# Patient Record
Sex: Female | Born: 1945 | Race: White | Hispanic: No | Marital: Married | Smoking: Former smoker
Health system: Southern US, Community
[De-identification: ages and names within clinical notes are randomized; demographics above are authoritative.]

## PROBLEM LIST (undated history)

## (undated) DIAGNOSIS — K5792 Diverticulitis of intestine, part unspecified, without perforation or abscess without bleeding: Secondary | ICD-10-CM

## (undated) DIAGNOSIS — E785 Hyperlipidemia, unspecified: Secondary | ICD-10-CM

## (undated) DIAGNOSIS — M810 Age-related osteoporosis without current pathological fracture: Secondary | ICD-10-CM

## (undated) DIAGNOSIS — E063 Autoimmune thyroiditis: Secondary | ICD-10-CM

## (undated) DIAGNOSIS — Z9889 Other specified postprocedural states: Secondary | ICD-10-CM

## (undated) DIAGNOSIS — Z86010 Personal history of colonic polyps: Secondary | ICD-10-CM

## (undated) DIAGNOSIS — C801 Malignant (primary) neoplasm, unspecified: Secondary | ICD-10-CM

## (undated) DIAGNOSIS — J449 Chronic obstructive pulmonary disease, unspecified: Secondary | ICD-10-CM

## (undated) DIAGNOSIS — K5730 Diverticulosis of large intestine without perforation or abscess without bleeding: Secondary | ICD-10-CM

## (undated) DIAGNOSIS — I10 Essential (primary) hypertension: Secondary | ICD-10-CM

## (undated) DIAGNOSIS — K5909 Other constipation: Secondary | ICD-10-CM

## (undated) DIAGNOSIS — H04129 Dry eye syndrome of unspecified lacrimal gland: Secondary | ICD-10-CM

## (undated) DIAGNOSIS — M1990 Unspecified osteoarthritis, unspecified site: Secondary | ICD-10-CM

## (undated) DIAGNOSIS — Z860101 Personal history of adenomatous and serrated colon polyps: Secondary | ICD-10-CM

## (undated) DIAGNOSIS — E039 Hypothyroidism, unspecified: Secondary | ICD-10-CM

## (undated) DIAGNOSIS — Z87440 Personal history of urinary (tract) infections: Secondary | ICD-10-CM

## (undated) DIAGNOSIS — R112 Nausea with vomiting, unspecified: Secondary | ICD-10-CM

## (undated) DIAGNOSIS — C679 Malignant neoplasm of bladder, unspecified: Secondary | ICD-10-CM

## (undated) DIAGNOSIS — R351 Nocturia: Secondary | ICD-10-CM

## (undated) HISTORY — PX: CHOLECYSTECTOMY: SHX55

## (undated) HISTORY — PX: FOOT SURGERY: SHX648

---

## 1998-05-01 HISTORY — PX: LAPAROSCOPIC CHOLECYSTECTOMY: SUR755

## 2001-10-15 ENCOUNTER — Other Ambulatory Visit: Admission: RE | Admit: 2001-10-15 | Discharge: 2001-10-15 | Payer: Self-pay | Admitting: General Surgery

## 2005-08-21 ENCOUNTER — Emergency Department (HOSPITAL_COMMUNITY): Admission: EM | Admit: 2005-08-21 | Discharge: 2005-08-21 | Payer: Self-pay | Admitting: Emergency Medicine

## 2005-08-30 ENCOUNTER — Ambulatory Visit: Payer: Self-pay | Admitting: Orthopedic Surgery

## 2005-09-06 ENCOUNTER — Ambulatory Visit (HOSPITAL_COMMUNITY): Admission: RE | Admit: 2005-09-06 | Discharge: 2005-09-06 | Payer: Self-pay | Admitting: Orthopedic Surgery

## 2005-09-13 ENCOUNTER — Ambulatory Visit: Payer: Self-pay | Admitting: Orthopedic Surgery

## 2005-09-28 ENCOUNTER — Ambulatory Visit (HOSPITAL_COMMUNITY): Admission: RE | Admit: 2005-09-28 | Discharge: 2005-09-28 | Payer: Self-pay | Admitting: Neurosurgery

## 2005-09-29 ENCOUNTER — Encounter (HOSPITAL_COMMUNITY): Admission: RE | Admit: 2005-09-29 | Discharge: 2005-10-29 | Payer: Self-pay | Admitting: Neurosurgery

## 2006-03-06 ENCOUNTER — Ambulatory Visit: Payer: Self-pay | Admitting: Orthopedic Surgery

## 2006-03-08 ENCOUNTER — Ambulatory Visit (HOSPITAL_COMMUNITY): Admission: RE | Admit: 2006-03-08 | Discharge: 2006-03-08 | Payer: Self-pay | Admitting: Neurosurgery

## 2006-03-08 ENCOUNTER — Encounter: Payer: Self-pay | Admitting: Orthopedic Surgery

## 2006-03-09 ENCOUNTER — Encounter (HOSPITAL_COMMUNITY): Admission: RE | Admit: 2006-03-09 | Discharge: 2006-04-08 | Payer: Self-pay | Admitting: Orthopedic Surgery

## 2006-04-09 ENCOUNTER — Encounter (HOSPITAL_COMMUNITY): Admission: RE | Admit: 2006-04-09 | Discharge: 2006-04-30 | Payer: Self-pay | Admitting: Orthopedic Surgery

## 2006-04-17 ENCOUNTER — Ambulatory Visit: Payer: Self-pay | Admitting: Orthopedic Surgery

## 2006-08-22 ENCOUNTER — Ambulatory Visit (HOSPITAL_COMMUNITY): Admission: RE | Admit: 2006-08-22 | Discharge: 2006-08-22 | Payer: Self-pay | Admitting: Family Medicine

## 2006-08-23 ENCOUNTER — Ambulatory Visit (HOSPITAL_COMMUNITY): Admission: RE | Admit: 2006-08-23 | Discharge: 2006-08-23 | Payer: Self-pay | Admitting: Family Medicine

## 2007-07-22 ENCOUNTER — Ambulatory Visit: Payer: Self-pay | Admitting: Orthopedic Surgery

## 2007-07-22 DIAGNOSIS — M542 Cervicalgia: Secondary | ICD-10-CM | POA: Insufficient documentation

## 2007-07-22 DIAGNOSIS — M4800 Spinal stenosis, site unspecified: Secondary | ICD-10-CM | POA: Insufficient documentation

## 2007-07-22 DIAGNOSIS — M47812 Spondylosis without myelopathy or radiculopathy, cervical region: Secondary | ICD-10-CM | POA: Insufficient documentation

## 2007-07-22 DIAGNOSIS — D1800 Hemangioma unspecified site: Secondary | ICD-10-CM | POA: Insufficient documentation

## 2007-07-23 ENCOUNTER — Telehealth: Payer: Self-pay | Admitting: Orthopedic Surgery

## 2007-07-25 ENCOUNTER — Encounter: Payer: Self-pay | Admitting: Orthopedic Surgery

## 2007-07-26 ENCOUNTER — Ambulatory Visit (HOSPITAL_COMMUNITY): Admission: RE | Admit: 2007-07-26 | Discharge: 2007-07-26 | Payer: Self-pay | Admitting: Orthopedic Surgery

## 2007-09-19 ENCOUNTER — Encounter: Payer: Self-pay | Admitting: Orthopedic Surgery

## 2009-01-18 ENCOUNTER — Ambulatory Visit: Payer: Self-pay | Admitting: Orthopedic Surgery

## 2009-01-18 DIAGNOSIS — M19049 Primary osteoarthritis, unspecified hand: Secondary | ICD-10-CM | POA: Insufficient documentation

## 2009-03-08 ENCOUNTER — Ambulatory Visit: Payer: Self-pay | Admitting: Orthopedic Surgery

## 2009-04-07 ENCOUNTER — Telehealth: Payer: Self-pay | Admitting: Orthopedic Surgery

## 2009-04-07 ENCOUNTER — Ambulatory Visit: Payer: Self-pay | Admitting: Orthopedic Surgery

## 2009-11-08 ENCOUNTER — Ambulatory Visit (HOSPITAL_COMMUNITY): Admission: RE | Admit: 2009-11-08 | Discharge: 2009-11-08 | Payer: Self-pay | Admitting: Family Medicine

## 2014-08-18 ENCOUNTER — Ambulatory Visit (HOSPITAL_COMMUNITY)
Admission: RE | Admit: 2014-08-18 | Discharge: 2014-08-18 | Disposition: A | Discharge status: Home / Self Care | Payer: BLUE CROSS/BLUE SHIELD | Source: Ambulatory Visit | Attending: Physician Assistant | Admitting: Physician Assistant

## 2014-08-18 ENCOUNTER — Other Ambulatory Visit (HOSPITAL_COMMUNITY): Payer: Self-pay | Admitting: Physician Assistant

## 2014-08-18 DIAGNOSIS — R5383 Other fatigue: Secondary | ICD-10-CM

## 2014-08-18 DIAGNOSIS — R0989 Other specified symptoms and signs involving the circulatory and respiratory systems: Secondary | ICD-10-CM | POA: Insufficient documentation

## 2014-08-18 DIAGNOSIS — R05 Cough: Secondary | ICD-10-CM | POA: Insufficient documentation

## 2014-08-18 DIAGNOSIS — R42 Dizziness and giddiness: Secondary | ICD-10-CM

## 2014-08-19 ENCOUNTER — Ambulatory Visit (HOSPITAL_COMMUNITY)
Admission: RE | Admit: 2014-08-19 | Discharge: 2014-08-19 | Disposition: A | Discharge status: Home / Self Care | Payer: BLUE CROSS/BLUE SHIELD | Source: Ambulatory Visit | Attending: Physician Assistant | Admitting: Physician Assistant

## 2014-08-19 DIAGNOSIS — R55 Syncope and collapse: Secondary | ICD-10-CM | POA: Diagnosis present

## 2014-08-19 DIAGNOSIS — R5383 Other fatigue: Secondary | ICD-10-CM | POA: Diagnosis not present

## 2014-08-19 DIAGNOSIS — I6523 Occlusion and stenosis of bilateral carotid arteries: Secondary | ICD-10-CM | POA: Insufficient documentation

## 2014-08-19 DIAGNOSIS — R42 Dizziness and giddiness: Secondary | ICD-10-CM | POA: Diagnosis not present

## 2015-07-16 ENCOUNTER — Encounter (INDEPENDENT_AMBULATORY_CARE_PROVIDER_SITE_OTHER): Payer: Self-pay | Admitting: *Deleted

## 2015-07-22 ENCOUNTER — Encounter (INDEPENDENT_AMBULATORY_CARE_PROVIDER_SITE_OTHER): Payer: Self-pay | Admitting: *Deleted

## 2015-07-23 ENCOUNTER — Other Ambulatory Visit (INDEPENDENT_AMBULATORY_CARE_PROVIDER_SITE_OTHER): Payer: Self-pay | Admitting: *Deleted

## 2015-07-23 DIAGNOSIS — Z800 Family history of malignant neoplasm of digestive organs: Secondary | ICD-10-CM

## 2015-07-23 DIAGNOSIS — Z1211 Encounter for screening for malignant neoplasm of colon: Secondary | ICD-10-CM

## 2015-08-10 ENCOUNTER — Other Ambulatory Visit (HOSPITAL_COMMUNITY): Payer: Self-pay | Admitting: Physician Assistant

## 2015-08-10 DIAGNOSIS — Z1231 Encounter for screening mammogram for malignant neoplasm of breast: Secondary | ICD-10-CM

## 2015-08-10 DIAGNOSIS — M810 Age-related osteoporosis without current pathological fracture: Secondary | ICD-10-CM

## 2015-08-16 ENCOUNTER — Ambulatory Visit (HOSPITAL_COMMUNITY)
Admission: RE | Admit: 2015-08-16 | Discharge: 2015-08-16 | Disposition: A | Discharge status: Home / Self Care | Payer: BLUE CROSS/BLUE SHIELD | Source: Ambulatory Visit | Attending: Physician Assistant | Admitting: Physician Assistant

## 2015-08-16 DIAGNOSIS — Z1231 Encounter for screening mammogram for malignant neoplasm of breast: Secondary | ICD-10-CM | POA: Insufficient documentation

## 2015-08-16 DIAGNOSIS — M810 Age-related osteoporosis without current pathological fracture: Secondary | ICD-10-CM | POA: Diagnosis present

## 2015-08-16 DIAGNOSIS — Z1389 Encounter for screening for other disorder: Secondary | ICD-10-CM | POA: Insufficient documentation

## 2015-09-01 ENCOUNTER — Other Ambulatory Visit (INDEPENDENT_AMBULATORY_CARE_PROVIDER_SITE_OTHER): Payer: Self-pay | Admitting: *Deleted

## 2015-09-01 ENCOUNTER — Encounter (INDEPENDENT_AMBULATORY_CARE_PROVIDER_SITE_OTHER): Payer: Self-pay | Admitting: *Deleted

## 2015-09-01 NOTE — Telephone Encounter (Signed)
Patient needs trilyte 

## 2015-09-06 MED ORDER — PEG 3350-KCL-NA BICARB-NACL 420 G PO SOLR
4000.0000 mL | Freq: Once | ORAL | Status: DC
Start: 2015-09-06 — End: 2015-12-15

## 2015-11-03 ENCOUNTER — Encounter (INDEPENDENT_AMBULATORY_CARE_PROVIDER_SITE_OTHER): Payer: Self-pay | Admitting: *Deleted

## 2015-11-03 NOTE — Telephone Encounter (Signed)
This encounter was created in error - please disregard.

## 2015-11-17 ENCOUNTER — Telehealth (INDEPENDENT_AMBULATORY_CARE_PROVIDER_SITE_OTHER): Payer: Self-pay | Admitting: *Deleted

## 2015-11-17 NOTE — Telephone Encounter (Signed)
Referring MD/PCP: golding   Procedure: tcs  Reason/Indication:  Screening, fam hx colon ca  Has patient had this procedure before?  Yes, more than 10 yrs ago  If so, when, by whom and where?    Is there a family history of colon cancer?  Yes, mother  Who?  What age when diagnosed?    Is patient diabetic?   no      Does patient have prosthetic heart valve or mechanical valve?  no  Do you have a pacemaker?  no  Has patient ever had endocarditis? o  Has patient had joint replacement within last 12 months?  no  Does patient tend to be constipated or take laxatives? yes  Does patient have a history of alcohol/drug use?  no  Is patient on Coumadin, Plavix and/or Aspirin? yes  Medications: asa 81 mg daily, simvastatin 20 mg daily, levothyroxine50 mg daily  Allergies: see epic  Medication Adjustment: asa 2 days  Procedure date & time: 12/15/15 at 730

## 2015-11-17 NOTE — Telephone Encounter (Signed)
agree

## 2015-12-15 ENCOUNTER — Ambulatory Visit (HOSPITAL_COMMUNITY)
Admission: RE | Admit: 2015-12-15 | Discharge: 2015-12-15 | Disposition: A | Discharge status: Home / Self Care | Payer: BLUE CROSS/BLUE SHIELD | Source: Ambulatory Visit | Attending: Internal Medicine | Admitting: Internal Medicine

## 2015-12-15 ENCOUNTER — Encounter (HOSPITAL_COMMUNITY): Payer: Self-pay | Admitting: *Deleted

## 2015-12-15 ENCOUNTER — Encounter (HOSPITAL_COMMUNITY)
Admission: RE | Disposition: A | Discharge status: Home / Self Care | Payer: Self-pay | Source: Ambulatory Visit | Attending: Internal Medicine

## 2015-12-15 DIAGNOSIS — F17210 Nicotine dependence, cigarettes, uncomplicated: Secondary | ICD-10-CM | POA: Insufficient documentation

## 2015-12-15 DIAGNOSIS — Z7982 Long term (current) use of aspirin: Secondary | ICD-10-CM | POA: Diagnosis not present

## 2015-12-15 DIAGNOSIS — Z79899 Other long term (current) drug therapy: Secondary | ICD-10-CM | POA: Insufficient documentation

## 2015-12-15 DIAGNOSIS — K6389 Other specified diseases of intestine: Secondary | ICD-10-CM | POA: Insufficient documentation

## 2015-12-15 DIAGNOSIS — D123 Benign neoplasm of transverse colon: Secondary | ICD-10-CM | POA: Diagnosis not present

## 2015-12-15 DIAGNOSIS — Z1211 Encounter for screening for malignant neoplasm of colon: Secondary | ICD-10-CM

## 2015-12-15 DIAGNOSIS — D120 Benign neoplasm of cecum: Secondary | ICD-10-CM

## 2015-12-15 DIAGNOSIS — Z9049 Acquired absence of other specified parts of digestive tract: Secondary | ICD-10-CM | POA: Diagnosis not present

## 2015-12-15 DIAGNOSIS — D128 Benign neoplasm of rectum: Secondary | ICD-10-CM | POA: Insufficient documentation

## 2015-12-15 DIAGNOSIS — Z800 Family history of malignant neoplasm of digestive organs: Secondary | ICD-10-CM | POA: Insufficient documentation

## 2015-12-15 DIAGNOSIS — E039 Hypothyroidism, unspecified: Secondary | ICD-10-CM | POA: Insufficient documentation

## 2015-12-15 DIAGNOSIS — D122 Benign neoplasm of ascending colon: Secondary | ICD-10-CM | POA: Insufficient documentation

## 2015-12-15 DIAGNOSIS — K621 Rectal polyp: Secondary | ICD-10-CM | POA: Diagnosis not present

## 2015-12-15 DIAGNOSIS — K644 Residual hemorrhoidal skin tags: Secondary | ICD-10-CM | POA: Diagnosis not present

## 2015-12-15 DIAGNOSIS — K5730 Diverticulosis of large intestine without perforation or abscess without bleeding: Secondary | ICD-10-CM | POA: Diagnosis not present

## 2015-12-15 HISTORY — DX: Nausea with vomiting, unspecified: R11.2

## 2015-12-15 HISTORY — DX: Other specified postprocedural states: Z98.890

## 2015-12-15 HISTORY — DX: Diverticulitis of intestine, part unspecified, without perforation or abscess without bleeding: K57.92

## 2015-12-15 HISTORY — PX: COLONOSCOPY: SHX5424

## 2015-12-15 HISTORY — DX: Unspecified osteoarthritis, unspecified site: M19.90

## 2015-12-15 HISTORY — DX: Hypothyroidism, unspecified: E03.9

## 2015-12-15 SURGERY — COLONOSCOPY
Anesthesia: Moderate Sedation

## 2015-12-15 MED ORDER — MEPERIDINE HCL 100 MG/ML IJ SOLN
INTRAMUSCULAR | Status: AC
  Filled 2015-12-15: qty 2

## 2015-12-15 MED ORDER — MEPERIDINE HCL 50 MG/ML IJ SOLN
INTRAMUSCULAR | Status: DC | PRN
  Administered 2015-12-15 (×2): 25 mg via INTRAVENOUS

## 2015-12-15 MED ORDER — MIDAZOLAM HCL 5 MG/5ML IJ SOLN
INTRAMUSCULAR | Status: DC
Start: 2015-12-15 — End: 2015-12-15
  Filled 2015-12-15: qty 10

## 2015-12-15 MED ORDER — STERILE WATER FOR IRRIGATION IR SOLN
Status: DC | PRN
  Administered 2015-12-15: 100 mL

## 2015-12-15 MED ORDER — SODIUM CHLORIDE 0.9 % IV SOLN
INTRAVENOUS | Status: DC
  Administered 2015-12-15: 07:00:00 via INTRAVENOUS

## 2015-12-15 MED ORDER — MIDAZOLAM HCL 5 MG/5ML IJ SOLN
INTRAMUSCULAR | Status: DC | PRN
  Administered 2015-12-15 (×2): 2 mg via INTRAVENOUS
  Administered 2015-12-15: 1 mg via INTRAVENOUS
  Administered 2015-12-15: 2 mg via INTRAVENOUS

## 2015-12-15 NOTE — H&P (Signed)
Rhonda Cook is an 70 y.o. female.   Chief Complaint: Patient is here for colonoscopy. HPI: Patient is 70 year old Caucasian female who is here for screening colonoscopy. Last exam was 10 years ago and was normal. She denies abdominal pain change in bowel habits or rectal bleeding. Family history significant for CRC and brother who is 55 at the time of diagnosis and her mother diagnosed with colon carcinoma at age 63.  Past Medical History:  Diagnosis Date  . Arthritis   . Diverticulitis   . Hypothyroidism   . PONV (postoperative nausea and vomiting)     Past Surgical History:  Procedure Laterality Date  . CHOLECYSTECTOMY      History reviewed. No pertinent family history. Social History:  reports that she has been smoking Cigarettes.  She has been smoking about 0.50 packs per day. She has never used smokeless tobacco. She reports that she does not drink alcohol or use drugs.  Allergies:  Allergies  Allergen Reactions  . Oxycodone Itching and Nausea And Vomiting  . Penicillins     Medications Prior to Admission  Medication Sig Dispense Refill  . alendronate (FOSAMAX) 70 MG tablet Take 70 mg by mouth once a week.  3  . aspirin EC 81 MG tablet Take 81 mg by mouth daily.    Marland Kitchen levothyroxine (SYNTHROID, LEVOTHROID) 50 MCG tablet Take 50 mcg by mouth daily.  1  . polyethylene glycol-electrolytes (NULYTELY/GOLYTELY) 420 g solution Take 4,000 mLs by mouth once. 4000 mL 0  . simvastatin (ZOCOR) 20 MG tablet Take 20 mg by mouth daily.  0    No results found for this or any previous visit (from the past 48 hour(s)). No results found.  ROS  Blood pressure (!) 142/71, pulse 76, temperature 98.8 F (37.1 C), temperature source Oral, resp. rate 18, SpO2 99 %. Physical Exam  Constitutional: She appears well-developed and well-nourished.  HENT:  Mouth/Throat: Oropharynx is clear and moist.  Eyes: Conjunctivae are normal. No scleral icterus.  Neck: No thyromegaly present.   Cardiovascular: Normal rate, regular rhythm and normal heart sounds.   No murmur heard. Respiratory: Effort normal and breath sounds normal.  GI: Soft. She exhibits no distension and no mass. There is no tenderness.  Musculoskeletal: She exhibits no edema.  Lymphadenopathy:    She has no cervical adenopathy.  Neurological: She is alert.  Skin: Skin is warm and dry.     Assessment/Plan High risk screening colonoscopy.  Hildred Laser, MD 12/15/2015, 7:34 AM

## 2015-12-15 NOTE — Op Note (Signed)
[facility] Patient Name: Rhonda Cook Procedure Date: 12/15/2015 7:30 AM MRN: [id number] Date of Birth: 04/15/1946 Attending MD: Hildred Laser , MD CSN: [id number] Age: 70 Admit Type: Outpatient Procedure:                Colonoscopy Indications:              Screening in patient at increased risk: Colorectal                            cancer in mother 69 or older, Screening in patient                            at increased risk: Colorectal cancer in brother                            before age 77 Providers:                Hildred Laser, MD, Tammy Vaught, RN, Purcell Nails.                            Tina Griffiths, Technician Referring MD:             Collene Mares [facility] Medicines:                Meperidine 50 mg IV, Midazolam 7 mg IV Complications:            No immediate complications. Estimated Blood Loss:     Estimated blood loss was minimal. Procedure:                Pre-Anesthesia Assessment:                           - Prior to the procedure, a History and Physical                            was performed, and patient medications and                            allergies were reviewed. The patient's tolerance of                            previous anesthesia was also reviewed. The risks                            and benefits of the procedure and the sedation                            options and risks were discussed with the patient.                            All questions were answered, and informed consent                            was obtained. Prior Anticoagulants: The patient  last took aspirin 7 days prior to the procedure.                            ASA Grade Assessment: II - A patient with mild                            systemic disease. After reviewing the risks and                            benefits, the patient was deemed in satisfactory                            condition to undergo the procedure.                           After obtaining  informed consent, the colonoscope                            was passed under direct vision. Throughout the                            procedure, the patient's blood pressure, pulse, and                            oxygen saturations were monitored continuously. The                            EC-[id number] ([id number]) scope was introduced through                            the anus and advanced to the the cecum, identified                            by appendiceal orifice and ileocecal valve. The                            colonoscopy was performed without difficulty. The                            patient tolerated the procedure well. The quality                            of the bowel preparation was good. The ileocecal                            valve, appendiceal orifice, and rectum were                            photographed. Scope In: 7:44:50 AM Scope Out: 8:28:53 AM Scope Withdrawal Time: 0 hours 35 minutes 16 seconds  Total Procedure Duration: 0 hours 44 minutes 3 seconds  Findings:      Three sessile polyps were found in the splenic flexure, ascending colon       and cecum. The polyps  were 6 to 10 mm in size. These polyps were removed       with a hot snare. Resection and retrieval were complete. The pathology       specimen was placed into Bottle Number 2.      Seven sessile polyps were found in the transverse colon and ascending       colon. The polyps were 5 to 7 mm in size. These polyps were removed with       a cold snare. Resection and retrieval were complete. The pathology       specimen was placed into Bottle Number 1.      A 12 mm polyp was found in the splenic flexure. The polyp was       semi-sessile. The polyp was removed with a hot snare. Resection and       retrieval were complete. The pathology specimen was placed into Bottle       Number 3.      A 12 mm polyp was found in the rectum. The polyp was semi-sessile. The       polyp was removed with a hot snare. Resection  and retrieval were       complete. The pathology specimen was placed into Bottle Number 4.      A diffuse area of mild melanosis was found in the entire colon.      A few medium-mouthed diverticula were found in the sigmoid colon,       descending colon and transverse colon.      External hemorrhoids were found during retroflexion. The hemorrhoids       were small. Impression:               - Three 6 to 10 mm polyps at the splenic flexure,                            in the ascending colon and in the cecum, removed                            with a hot snare. Resected and retrieved.                           - Seven 5 to 7 mm polyps in the transverse colon                            and in the ascending colon, removed with a cold                            snare. Resected and retrieved.                           - One 12 mm polyp at the splenic flexure, removed                            with a hot snare. Resected and retrieved.                           - One 12 mm polyp in the rectum, removed with a hot  snare. Resected and retrieved.                           - Melanosis in the colon.                           - Diverticulosis in the sigmoid colon, in the                            descending colon and in the transverse colon.                           - External hemorrhoids. Moderate Sedation:      Moderate (conscious) sedation was administered by the endoscopy nurse       and supervised by the endoscopist. The following parameters were       monitored: oxygen saturation, heart rate, blood pressure, CO2       capnography and response to care. Total physician intraservice time was       50 minutes. Recommendation:           - Patient has a contact number available for                            emergencies. The signs and symptoms of potential                            delayed complications were discussed with the                            patient. Return to  normal activities tomorrow.                            Written discharge instructions were provided to the                            patient.                           - Resume previous diet today.                           - Continue present medications.                           - No aspirin, ibuprofen, naproxen, or other                            non-steroidal anti-inflammatory drugs for 7 days                            after polyp removal.                           - Await pathology results.                           - Repeat colonoscopy in  3 years for surveillance. Procedure Code(s):        --- Professional ---                           [phone], Colonoscopy, flexible; with removal of                            tumor(s), polyp(s), or other lesion(s) by snare                            technique                           99152, Moderate sedation services provided by the                            same physician or other qualified health care                            professional performing the diagnostic or                            therapeutic service that the sedation supports,                            requiring the presence of an independent trained                            observer to assist in the monitoring of the                            patient's level of consciousness and physiological                            status; initial 15 minutes of intraservice time,                            patient age 35 years or older                           [phone], Moderate sedation services; each additional                            15 minutes intraservice time                           99153, Moderate sedation services; each additional                            15 minutes intraservice time Diagnosis Code(s):        --- Professional ---                           D12.0, Benign neoplasm of cecum  D12.3, Benign neoplasm of transverse colon (hepatic                             flexure or splenic flexure)                           D12.2, Benign neoplasm of ascending colon                           K62.1, Rectal polyp                           K63.89, Other specified diseases of intestine                           K64.4, Residual hemorrhoidal skin tags                           Z80.0, Family history of malignant neoplasm of                            digestive organs                           K57.30, Diverticulosis of large intestine without                            perforation or abscess without bleeding CPT copyright 2016 American [employer] Association. All rights reserved. The codes documented in this report are preliminary and upon coder review may  be revised to meet current compliance requirements. Hildred Laser, MD Hildred Laser, MD 12/15/2015 8:41:00 AM This report has been signed electronically. Number of Addenda: 0

## 2015-12-15 NOTE — Discharge Instructions (Signed)
° °  Colonoscopy, Care After Refer to this sheet in the next few weeks. These instructions provide you with information on caring for yourself after your procedure. Your health care provider may also give you more specific instructions. Your treatment has been planned according to current medical practices, but problems sometimes occur. Call your health care provider if you have any problems or questions after your procedure. WHAT TO EXPECT AFTER THE PROCEDURE  After your procedure, it is typical to have the following:  A small amount of blood in your stool.  Moderate amounts of gas and mild abdominal cramping or bloating. HOME CARE INSTRUCTIONS  Do not drive, operate machinery, or sign important documents for 24 hours.  You may shower and resume your regular physical activities, but move at a slower pace for the first 24 hours.  Take frequent rest periods for the first 24 hours.  Walk around or put a warm pack on your abdomen to help reduce abdominal cramping and bloating.  Drink enough fluids to keep your urine clear or pale yellow.  You may resume your normal diet as instructed by your health care provider. Avoid heavy or fried foods that are hard to digest.  Avoid drinking alcohol for 24 hours or as instructed by your health care provider.  Only take over-the-counter or prescription medicines as directed by your health care provider.  If a tissue sample (biopsy) was taken during your procedure:  Do not take aspirin or blood thinners for 7 days, or as instructed by your health care provider.  Do not drink alcohol for 7 days, or as instructed by your health care provider.  Eat soft foods for the first 24 hours. SEEK MEDICAL CARE IF: You have persistent spotting of blood in your stool 2-3 days after the procedure. SEEK IMMEDIATE MEDICAL CARE IF:  You have more than a small spotting of blood in your stool.  You pass large blood clots in your stool.  Your abdomen is swollen  (distended).  You have nausea or vomiting.  You have a fever.  You have increasing abdominal pain that is not relieved with medicine.   This information is not intended to replace advice given to you by your health care provider. Make sure you discuss any questions you have with your health care provider.   Document Released: 11/30/2003 Document Revised: 02/05/2013 Document Reviewed: 12/23/2012 Elsevier Interactive Patient Education 2016 [place]. No aspirin or NSAIDs for 1 week. Resume other medications and diet as before. No driving for 24 hours. Physician will call with biopsy results.

## 2015-12-17 ENCOUNTER — Encounter (HOSPITAL_COMMUNITY): Payer: Self-pay | Admitting: Internal Medicine

## 2016-05-01 HISTORY — PX: FOOT SURGERY: SHX648

## 2017-03-09 ENCOUNTER — Ambulatory Visit (HOSPITAL_COMMUNITY)
Admission: RE | Admit: 2017-03-09 | Discharge: 2017-03-09 | Disposition: A | Discharge status: Home / Self Care | Payer: BLUE CROSS/BLUE SHIELD | Source: Ambulatory Visit | Attending: Family Medicine | Admitting: Family Medicine

## 2017-03-09 ENCOUNTER — Other Ambulatory Visit (HOSPITAL_COMMUNITY): Payer: Self-pay | Admitting: Family Medicine

## 2017-03-09 DIAGNOSIS — J209 Acute bronchitis, unspecified: Secondary | ICD-10-CM | POA: Diagnosis not present

## 2017-03-09 DIAGNOSIS — I7090 Unspecified atherosclerosis: Secondary | ICD-10-CM | POA: Diagnosis not present

## 2017-03-09 DIAGNOSIS — J449 Chronic obstructive pulmonary disease, unspecified: Secondary | ICD-10-CM | POA: Diagnosis not present

## 2017-03-09 DIAGNOSIS — J069 Acute upper respiratory infection, unspecified: Secondary | ICD-10-CM | POA: Diagnosis not present

## 2017-03-09 DIAGNOSIS — J40 Bronchitis, not specified as acute or chronic: Secondary | ICD-10-CM

## 2017-05-08 DIAGNOSIS — M2012 Hallux valgus (acquired), left foot: Secondary | ICD-10-CM | POA: Diagnosis not present

## 2017-05-08 DIAGNOSIS — S93332D Other subluxation of left foot, subsequent encounter: Secondary | ICD-10-CM | POA: Diagnosis not present

## 2017-05-08 DIAGNOSIS — M79672 Pain in left foot: Secondary | ICD-10-CM | POA: Diagnosis not present

## 2017-05-28 DIAGNOSIS — L820 Inflamed seborrheic keratosis: Secondary | ICD-10-CM | POA: Diagnosis not present

## 2017-06-05 DIAGNOSIS — H26491 Other secondary cataract, right eye: Secondary | ICD-10-CM | POA: Diagnosis not present

## 2017-06-05 DIAGNOSIS — Z961 Presence of intraocular lens: Secondary | ICD-10-CM | POA: Diagnosis not present

## 2017-06-13 DIAGNOSIS — H26492 Other secondary cataract, left eye: Secondary | ICD-10-CM | POA: Diagnosis not present

## 2017-09-10 DIAGNOSIS — I10 Essential (primary) hypertension: Secondary | ICD-10-CM | POA: Diagnosis not present

## 2017-09-10 DIAGNOSIS — Z1389 Encounter for screening for other disorder: Secondary | ICD-10-CM | POA: Diagnosis not present

## 2017-09-10 DIAGNOSIS — E063 Autoimmune thyroiditis: Secondary | ICD-10-CM | POA: Diagnosis not present

## 2017-09-10 DIAGNOSIS — Z6822 Body mass index (BMI) 22.0-22.9, adult: Secondary | ICD-10-CM | POA: Diagnosis not present

## 2017-09-25 DIAGNOSIS — R7309 Other abnormal glucose: Secondary | ICD-10-CM | POA: Diagnosis not present

## 2017-09-25 DIAGNOSIS — Z0001 Encounter for general adult medical examination with abnormal findings: Secondary | ICD-10-CM | POA: Diagnosis not present

## 2017-09-25 DIAGNOSIS — Z0000 Encounter for general adult medical examination without abnormal findings: Secondary | ICD-10-CM | POA: Diagnosis not present

## 2017-09-25 DIAGNOSIS — Z1389 Encounter for screening for other disorder: Secondary | ICD-10-CM | POA: Diagnosis not present

## 2017-09-25 DIAGNOSIS — Z6822 Body mass index (BMI) 22.0-22.9, adult: Secondary | ICD-10-CM | POA: Diagnosis not present

## 2017-10-09 DIAGNOSIS — I10 Essential (primary) hypertension: Secondary | ICD-10-CM | POA: Diagnosis not present

## 2017-10-12 ENCOUNTER — Encounter (HOSPITAL_COMMUNITY): Payer: Self-pay

## 2017-10-12 ENCOUNTER — Encounter (HOSPITAL_COMMUNITY)
Admission: RE | Admit: 2017-10-12 | Discharge: 2017-10-12 | Disposition: A | Discharge status: Home / Self Care | Payer: PPO | Source: Ambulatory Visit | Attending: Physician Assistant | Admitting: Physician Assistant

## 2017-10-12 DIAGNOSIS — M810 Age-related osteoporosis without current pathological fracture: Secondary | ICD-10-CM | POA: Insufficient documentation

## 2017-10-12 HISTORY — DX: Essential (primary) hypertension: I10

## 2017-10-12 HISTORY — DX: Hyperlipidemia, unspecified: E78.5

## 2017-10-12 MED ORDER — DENOSUMAB 60 MG/ML ~~LOC~~ SOSY
60.0000 mg | PREFILLED_SYRINGE | Freq: Once | SUBCUTANEOUS | Status: AC
  Administered 2017-10-12: 60 mg via SUBCUTANEOUS
  Filled 2017-10-12: qty 1

## 2017-10-12 NOTE — Discharge Instructions (Signed)
Denosumab injection °What is this medicine? °DENOSUMAB (den oh sue mab) slows bone breakdown. Prolia is used to treat osteoporosis in women after menopause and in men. Xgeva is used to treat a high calcium level due to cancer and to prevent bone fractures and other bone problems caused by multiple myeloma or cancer bone metastases. Xgeva is also used to treat giant cell tumor of the bone. °This medicine may be used for other purposes; ask your health care provider or pharmacist if you have questions. °COMMON BRAND NAME(S): Prolia, XGEVA °What should I tell my health care provider before I take this medicine? °They need to know if you have any of these conditions: °-dental disease °-having surgery or tooth extraction °-infection °-kidney disease °-low levels of calcium or Vitamin D in the blood °-malnutrition °-on hemodialysis °-skin conditions or sensitivity °-thyroid or parathyroid disease °-an unusual reaction to denosumab, other medicines, foods, dyes, or preservatives °-pregnant or trying to get pregnant °-breast-feeding °How should I use this medicine? °This medicine is for injection under the skin. It is given by a health care professional in a hospital or clinic setting. °If you are getting Prolia, a special MedGuide will be given to you by the pharmacist with each prescription and refill. Be sure to read this information carefully each time. °For Prolia, talk to your pediatrician regarding the use of this medicine in children. Special care may be needed. For Xgeva, talk to your pediatrician regarding the use of this medicine in children. While this drug may be prescribed for children as young as 13 years for selected conditions, precautions do apply. °Overdosage: If you think you have taken too much of this medicine contact a poison control center or emergency room at once. °NOTE: This medicine is only for you. Do not share this medicine with others. °What if I miss a dose? °It is important not to miss your  dose. Call your doctor or health care professional if you are unable to keep an appointment. °What may interact with this medicine? °Do not take this medicine with any of the following medications: °-other medicines containing denosumab °This medicine may also interact with the following medications: °-medicines that lower your chance of fighting infection °-steroid medicines like prednisone or cortisone °This list may not describe all possible interactions. Give your health care provider a list of all the medicines, herbs, non-prescription drugs, or dietary supplements you use. Also tell them if you smoke, drink alcohol, or use illegal drugs. Some items may interact with your medicine. °What should I watch for while using this medicine? °Visit your doctor or health care professional for regular checks on your progress. Your doctor or health care professional may order blood tests and other tests to see how you are doing. °Call your doctor or health care professional for advice if you get a fever, chills or sore throat, or other symptoms of a cold or flu. Do not treat yourself. This drug may decrease your body's ability to fight infection. Try to avoid being around people who are sick. °You should make sure you get enough calcium and vitamin D while you are taking this medicine, unless your doctor tells you not to. Discuss the foods you eat and the vitamins you take with your health care professional. °See your dentist regularly. Brush and floss your teeth as directed. Before you have any dental work done, tell your dentist you are receiving this medicine. °Do not become pregnant while taking this medicine or for 5 months after stopping   it. Talk with your doctor or health care professional about your birth control options while taking this medicine. Women should inform their doctor if they wish to become pregnant or think they might be pregnant. There is a potential for serious side effects to an unborn child. Talk  to your health care professional or pharmacist for more information. What side effects may I notice from receiving this medicine? Side effects that you should report to your doctor or health care professional as soon as possible: -allergic reactions like skin rash, itching or hives, swelling of the face, lips, or tongue -bone pain -breathing problems -dizziness -jaw pain, especially after dental work -redness, blistering, peeling of the skin -signs and symptoms of infection like fever or chills; cough; sore throat; pain or trouble passing urine -signs of low calcium like fast heartbeat, muscle cramps or muscle pain; pain, tingling, numbness in the hands or feet; seizures -unusual bleeding or bruising -unusually weak or tired Side effects that usually do not require medical attention (report to your doctor or health care professional if they continue or are bothersome): -constipation -diarrhea -headache -joint pain -loss of appetite -muscle pain -runny nose -tiredness -upset stomach This list may not describe all possible side effects. Call your doctor for medical advice about side effects. You may report side effects to FDA at 1-800-FDA-1088. Where should I keep my medicine? This medicine is only given in a clinic, doctor's office, or other health care setting and will not be stored at home. NOTE: This sheet is a summary. It may not cover all possible information. If you have questions about this medicine, talk to your doctor, pharmacist, or health care provider.  2018 Elsevier/Gold Standard (2016-05-09 19:17:21)

## 2017-10-12 NOTE — Progress Notes (Signed)
Consent signed for Prolia and teaching completed.  Written material provided.  Advised to start Calcium and Vitamin D at the recommended doses.  States will start tomorrow.  Verbalized understanding.

## 2017-10-23 DIAGNOSIS — E039 Hypothyroidism, unspecified: Secondary | ICD-10-CM | POA: Diagnosis not present

## 2017-11-15 ENCOUNTER — Ambulatory Visit (HOSPITAL_COMMUNITY)
Admission: RE | Admit: 2017-11-15 | Discharge: 2017-11-15 | Disposition: A | Discharge status: Home / Self Care | Payer: PPO | Source: Ambulatory Visit | Attending: Family Medicine | Admitting: Family Medicine

## 2017-11-15 ENCOUNTER — Other Ambulatory Visit (HOSPITAL_COMMUNITY): Payer: Self-pay | Admitting: Family Medicine

## 2017-11-15 DIAGNOSIS — R39198 Other difficulties with micturition: Secondary | ICD-10-CM | POA: Insufficient documentation

## 2017-11-15 DIAGNOSIS — K5730 Diverticulosis of large intestine without perforation or abscess without bleeding: Secondary | ICD-10-CM | POA: Insufficient documentation

## 2017-11-15 DIAGNOSIS — I700 Atherosclerosis of aorta: Secondary | ICD-10-CM | POA: Diagnosis not present

## 2017-11-15 DIAGNOSIS — R319 Hematuria, unspecified: Secondary | ICD-10-CM | POA: Insufficient documentation

## 2017-11-15 DIAGNOSIS — J439 Emphysema, unspecified: Secondary | ICD-10-CM | POA: Diagnosis not present

## 2017-11-15 DIAGNOSIS — D3502 Benign neoplasm of left adrenal gland: Secondary | ICD-10-CM | POA: Diagnosis not present

## 2017-11-15 DIAGNOSIS — Z6821 Body mass index (BMI) 21.0-21.9, adult: Secondary | ICD-10-CM | POA: Diagnosis not present

## 2018-03-14 DIAGNOSIS — N342 Other urethritis: Secondary | ICD-10-CM | POA: Diagnosis not present

## 2018-03-14 DIAGNOSIS — Z1389 Encounter for screening for other disorder: Secondary | ICD-10-CM | POA: Diagnosis not present

## 2018-03-14 DIAGNOSIS — Z6821 Body mass index (BMI) 21.0-21.9, adult: Secondary | ICD-10-CM | POA: Diagnosis not present

## 2018-04-12 ENCOUNTER — Inpatient Hospital Stay (HOSPITAL_COMMUNITY): Admission: RE | Admit: 2018-04-12 | Payer: PPO | Source: Ambulatory Visit

## 2018-04-12 ENCOUNTER — Encounter (HOSPITAL_COMMUNITY)
Admission: RE | Admit: 2018-04-12 | Discharge: 2018-04-12 | Disposition: A | Discharge status: Home / Self Care | Payer: PPO | Source: Ambulatory Visit | Attending: Physician Assistant | Admitting: Physician Assistant

## 2018-04-12 DIAGNOSIS — M810 Age-related osteoporosis without current pathological fracture: Secondary | ICD-10-CM | POA: Insufficient documentation

## 2018-04-12 MED ORDER — DENOSUMAB 60 MG/ML ~~LOC~~ SOSY
60.0000 mg | PREFILLED_SYRINGE | Freq: Once | SUBCUTANEOUS | Status: AC
Start: 2018-04-12 — End: 2018-04-12
  Administered 2018-04-12: 60 mg via SUBCUTANEOUS

## 2018-04-22 DIAGNOSIS — N342 Other urethritis: Secondary | ICD-10-CM | POA: Diagnosis not present

## 2018-04-22 DIAGNOSIS — Z6821 Body mass index (BMI) 21.0-21.9, adult: Secondary | ICD-10-CM | POA: Diagnosis not present

## 2018-04-22 DIAGNOSIS — R319 Hematuria, unspecified: Secondary | ICD-10-CM | POA: Diagnosis not present

## 2018-05-03 ENCOUNTER — Emergency Department (HOSPITAL_COMMUNITY)
Admission: EM | Admit: 2018-05-03 | Discharge: 2018-05-03 | Disposition: A | Discharge status: Home / Self Care | Payer: PPO | Attending: Emergency Medicine | Admitting: Emergency Medicine

## 2018-05-03 ENCOUNTER — Emergency Department (HOSPITAL_COMMUNITY): Payer: PPO

## 2018-05-03 ENCOUNTER — Other Ambulatory Visit: Payer: Self-pay

## 2018-05-03 ENCOUNTER — Encounter (HOSPITAL_COMMUNITY): Payer: Self-pay | Admitting: Emergency Medicine

## 2018-05-03 DIAGNOSIS — R319 Hematuria, unspecified: Secondary | ICD-10-CM | POA: Insufficient documentation

## 2018-05-03 DIAGNOSIS — F17210 Nicotine dependence, cigarettes, uncomplicated: Secondary | ICD-10-CM | POA: Diagnosis not present

## 2018-05-03 DIAGNOSIS — I10 Essential (primary) hypertension: Secondary | ICD-10-CM | POA: Diagnosis not present

## 2018-05-03 DIAGNOSIS — D3502 Benign neoplasm of left adrenal gland: Secondary | ICD-10-CM | POA: Diagnosis not present

## 2018-05-03 DIAGNOSIS — E039 Hypothyroidism, unspecified: Secondary | ICD-10-CM | POA: Insufficient documentation

## 2018-05-03 LAB — CBC WITH DIFFERENTIAL/PLATELET
ABS IMMATURE GRANULOCYTES: 0.01 10*3/uL (ref 0.00–0.07)
BASOS ABS: 0.1 10*3/uL (ref 0.0–0.1)
BASOS PCT: 1 %
Eosinophils Absolute: 0.2 10*3/uL (ref 0.0–0.5)
Eosinophils Relative: 3 %
HCT: 42.8 % (ref 36.0–46.0)
Hemoglobin: 14 g/dL (ref 12.0–15.0)
IMMATURE GRANULOCYTES: 0 %
Lymphocytes Relative: 27 %
Lymphs Abs: 2.3 10*3/uL (ref 0.7–4.0)
MCH: 31 pg (ref 26.0–34.0)
MCHC: 32.7 g/dL (ref 30.0–36.0)
MCV: 94.7 fL (ref 80.0–100.0)
Monocytes Absolute: 0.6 10*3/uL (ref 0.1–1.0)
Monocytes Relative: 7 %
NEUTROS ABS: 5.3 10*3/uL (ref 1.7–7.7)
NEUTROS PCT: 62 %
PLATELETS: 386 10*3/uL (ref 150–400)
RBC: 4.52 MIL/uL (ref 3.87–5.11)
RDW: 13.6 % (ref 11.5–15.5)
WBC: 8.4 10*3/uL (ref 4.0–10.5)
nRBC: 0 % (ref 0.0–0.2)

## 2018-05-03 LAB — URINALYSIS, ROUTINE W REFLEX MICROSCOPIC
Bilirubin Urine: NEGATIVE
GLUCOSE, UA: NEGATIVE mg/dL
Ketones, ur: NEGATIVE mg/dL
Nitrite: NEGATIVE
PROTEIN: 30 mg/dL — AB
SPECIFIC GRAVITY, URINE: 1.004 — AB (ref 1.005–1.030)
pH: 7 (ref 5.0–8.0)

## 2018-05-03 LAB — BASIC METABOLIC PANEL
ANION GAP: 11 (ref 5–15)
BUN: 12 mg/dL (ref 8–23)
CO2: 27 mmol/L (ref 22–32)
Calcium: 9.5 mg/dL (ref 8.9–10.3)
Chloride: 101 mmol/L (ref 98–111)
Creatinine, Ser: 0.58 mg/dL (ref 0.44–1.00)
GFR calc Af Amer: >60 mL/min (ref >60)
Glucose, Bld: 94 mg/dL (ref 70–99)
POTASSIUM: 3.6 mmol/L (ref 3.5–5.1)
SODIUM: 139 mmol/L (ref 135–145)

## 2018-05-03 NOTE — ED Provider Notes (Signed)
Emergency Department Provider Note   I have reviewed the triage vital signs and the nursing notes.   HISTORY  Chief Complaint Recurrent UTI   HPI Rhonda Cook is a 73 y.o. female with PMH of HLD, HTN, and arthritis presents to the emergency department for evaluation of hematuria with clot passage that returned yesterday.  Patient states this is been a recurrent problem for her over the past several months.  She has been on a total of 5 antibiotics for urinary tract infections and has been referred to urology.  She has an appointment in early February.  Patient states that when she is on antibiotics she does not have bleeding however once she completes the antibiotic course her bleeding returns.  She is not having any urine retention symptoms.  Mild, persistent, pain over her lower abdomen with no modifying factors.  She denies any vaginal bleeding or discharge.  No fevers, chills, backslash flank pain.  She called her PCP today who called in an additional antibiotic but was referred to the emergency department as she states she was feeling somewhat fatigued and weak.   Past Medical History:  Diagnosis Date  . Arthritis   . Diverticulitis   . Hyperlipidemia   . Hypertension   . Hypothyroidism   . PONV (postoperative nausea and vomiting)     Patient Active Problem List   Diagnosis Date Noted  . 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 HEMANGIOMA OF UNSPECIFIED SITE 07/22/2007  . 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 CERVICALGIA 07/22/2007  . SPINAL STENOSIS 07/22/2007    Past Surgical History:  Procedure Laterality Date  . CHOLECYSTECTOMY    . COLONOSCOPY N/A 12/15/2015   Procedure: COLONOSCOPY;  Surgeon: Rogene Houston, MD;  [facility];  Service: Endoscopy;  Laterality: N/A;  730 - moved to 8/16 @ 7:30    Allergies Oxycodone and Penicillins  No family history on file.  Social History Social History   Tobacco Use  . Smoking status:  Current Every Day Smoker    Packs/day: 0.50    Types: Cigarettes  . Smokeless tobacco: Never Used  Substance Use Topics  . Alcohol use: No  . Drug use: No    Review of Systems  Constitutional: No fever/chills. Positive fatigue.  Eyes: No visual changes. ENT: No sore throat. Cardiovascular: Denies chest pain. Respiratory: Denies shortness of breath. Gastrointestinal: Positive lower abdominal pain.  No nausea, no vomiting.  No diarrhea.  No constipation. Genitourinary: Negative for dysuria. Positive hematuria with blood clots noted.  Musculoskeletal: Negative for back pain.  Skin: Negative for rash. Neurological: Negative for focal weakness or numbness. Positive HA.   10-point ROS otherwise negative.  ____________________________________________   PHYSICAL EXAM:  VITAL SIGNS: ED Triage Vitals [05/03/18 1449]  Enc Vitals Group     BP 133/85     Pulse Rate 94     Resp 18     Temp 98 F (36.7 C)     Temp Source Oral     SpO2 98 %     Weight 126 lb (57.2 kg)     Height 5\' 3"  (1.6 m)   Constitutional: Alert and oriented. Well appearing and in no acute distress. Eyes: Conjunctivae are normal. Head: Atraumatic. Nose: No congestion/rhinnorhea. Mouth/Throat: Mucous membranes are moist. Neck: No stridor.  Cardiovascular: Normal rate, regular rhythm. Good peripheral circulation. Grossly normal heart sounds.   Respiratory: Normal respiratory effort.  No retractions. Lungs CTAB. Gastrointestinal: Soft with mild lower abdominal tenderness.  No rebound or guarding. No distention.  Musculoskeletal: No lower extremity tenderness nor edema. No gross deformities of extremities. Neurologic:  Normal speech and language. No gross focal neurologic deficits are appreciated.  Skin:  Skin is warm, dry and intact. No rash noted.  ____________________________________________   LABS (all labs ordered are listed, but only abnormal results are displayed)  Labs Reviewed  URINALYSIS, ROUTINE  W REFLEX MICROSCOPIC - Abnormal; Notable for the following components:      Result Value   Color, Urine AMBER (*)    APPearance HAZY (*)    Specific Gravity, Urine 1.004 (*)    Hgb urine dipstick LARGE (*)    Protein, ur 30 (*)    Leukocytes, UA TRACE (*)    RBC / HPF >50 (*)    Bacteria, UA RARE (*)    All other components within normal limits  URINE CULTURE  BASIC METABOLIC PANEL  CBC WITH DIFFERENTIAL/PLATELET   ____________________________________________  RADIOLOGY  Ct Renal Stone Study  Result Date: 05/03/2018 CLINICAL DATA:  Patient on antibiotics for UTI passing blood clots. EXAM: CT ABDOMEN AND PELVIS WITHOUT CONTRAST TECHNIQUE: Multidetector CT imaging of the abdomen and pelvis was performed following the standard protocol without IV contrast. COMPARISON:  11/15/2017 FINDINGS: Lower chest: Lung bases are within normal. Hepatobiliary: Previous cholecystectomy. Liver and biliary tree are normal. Pancreas: Normal. Spleen: Normal. Adrenals/Urinary Tract: 2 small left adrenal adenomas unchanged. Right adrenal gland unchanged. Kidneys are normal in size without hydronephrosis or nephrolithiasis. Stable 1.6 cm hypodensity over the lower pole left kidney with subtle associated calcification likely minimally complicated cyst. Ureters are normal. Patchy dependent intermediate density material over the bladder likely hemorrhage/clots as bladder wall mass/polyp is also possible. Stomach/Bowel: Stomach and small bowel are normal. Appendix is not well visualized. Diverticulosis of the colon mild to moderate fecal retention over the rectosigmoid colon Vascular/Lymphatic: Mild-to-moderate calcified plaque over the abdominal aorta no adenopathy. Reproductive: Unremarkable. Other: No free fluid or focal inflammatory change. Musculoskeletal: Mild degenerative change of the spine. IMPRESSION: Mild patchy intermediate density over the dependent portion of the bladder likely hemorrhage/clots, although  bladder mass/polyp is possible. Stable 1.6 cm hypodensity with subtle calcification over the lower pole left kidney likely complicated cyst. Small stable left adrenal adenomas. Diverticulosis of the colon. Mild to moderate fecal retention over the rectosigmoid colon. Aortic Atherosclerosis (ICD10-I70.0). Electronically Signed   By: Marin Olp M.D.   On: 05/03/2018 16:40    ____________________________________________   PROCEDURES  Procedure(s) performed:   Procedures  None ____________________________________________   INITIAL IMPRESSION / ASSESSMENT AND PLAN / ED COURSE  Pertinent labs & imaging results that were available during my care of the patient were reviewed by me and considered in my medical decision making (see chart for details).  Patient presents to the emergency department with return of her hematuria and associated generalized fatigue with mild headache.  Patient has very mild lower abdominal tenderness on exam with no focal findings.  Her urine today does show hematuria with no clear UTI.  Her PCP did call in an antibiotic prescription which she has not started as of yet.  I do plan to send urine culture and obtain CBC and chemistry given her generalized weakness in the setting of recurrent hematuria.  We will also obtain a CT renal scan as she has not had one since July.  She does have urology appointment scheduled for February.  No indication for Foley placement or bladder irrigation at this time without retention symptoms.  05:00 PM Viewed the patient's blood work and CT imaging.  She has emerged versus clot in the dependent portion of the bladder.  Baseline renal cyst.  No other acute findings.  Patient is not anemic.  Very low suspicion for urinary tract infection causing hematuria.  Patient has plan to follow with urology.  I specifically discussed the signs and symptoms of urinary retention and reasons to return to the emergency department sooner should the symptoms  develop for foley placement. Plan for discharge at this time.  ____________________________________________  FINAL CLINICAL IMPRESSION(S) / ED DIAGNOSES  Final diagnoses:  Hematuria, unspecified type    Note:  This document was prepared using Dragon voice recognition software and may include unintentional dictation errors.  Nanda Quinton, MD Emergency Medicine    Diya Gervasi, W[facility], MD 05/03/18 [phone]

## 2018-05-03 NOTE — ED Notes (Signed)
Patient transported to CT 

## 2018-05-03 NOTE — ED Notes (Signed)
Pt returned from CT °

## 2018-05-03 NOTE — Discharge Instructions (Signed)
You were seen in the ED today with blood in the urine. You will need to see the Urologist for further evaluation. You should return to the ED with any symptoms where you are unable to urinate, have worsening lower abdominal pain, or if you develop fever.

## 2018-05-03 NOTE — ED Triage Notes (Signed)
Pt states she was just prescribed her 5th antibiotic for UTI, today passing blood clot in  Urine and frequency.

## 2018-05-05 LAB — URINE CULTURE: Culture: NO GROWTH

## 2018-05-06 DIAGNOSIS — Z6821 Body mass index (BMI) 21.0-21.9, adult: Secondary | ICD-10-CM | POA: Diagnosis not present

## 2018-05-06 DIAGNOSIS — Z1389 Encounter for screening for other disorder: Secondary | ICD-10-CM | POA: Diagnosis not present

## 2018-05-06 DIAGNOSIS — R310 Gross hematuria: Secondary | ICD-10-CM | POA: Diagnosis not present

## 2018-05-06 DIAGNOSIS — R319 Hematuria, unspecified: Secondary | ICD-10-CM | POA: Diagnosis not present

## 2018-05-08 DIAGNOSIS — N3021 Other chronic cystitis with hematuria: Secondary | ICD-10-CM | POA: Diagnosis not present

## 2018-05-08 DIAGNOSIS — D3500 Benign neoplasm of unspecified adrenal gland: Secondary | ICD-10-CM | POA: Diagnosis not present

## 2018-05-13 DIAGNOSIS — R310 Gross hematuria: Secondary | ICD-10-CM | POA: Diagnosis not present

## 2018-05-13 DIAGNOSIS — D3502 Benign neoplasm of left adrenal gland: Secondary | ICD-10-CM | POA: Diagnosis not present

## 2018-05-17 DIAGNOSIS — D414 Neoplasm of uncertain behavior of bladder: Secondary | ICD-10-CM | POA: Diagnosis not present

## 2018-05-20 ENCOUNTER — Other Ambulatory Visit: Payer: Self-pay | Admitting: Urology

## 2018-05-20 DIAGNOSIS — Z6821 Body mass index (BMI) 21.0-21.9, adult: Secondary | ICD-10-CM | POA: Diagnosis not present

## 2018-05-20 DIAGNOSIS — K859 Acute pancreatitis without necrosis or infection, unspecified: Secondary | ICD-10-CM | POA: Diagnosis not present

## 2018-05-20 DIAGNOSIS — R110 Nausea: Secondary | ICD-10-CM | POA: Diagnosis not present

## 2018-05-21 DIAGNOSIS — Z6821 Body mass index (BMI) 21.0-21.9, adult: Secondary | ICD-10-CM | POA: Diagnosis not present

## 2018-05-21 DIAGNOSIS — R110 Nausea: Secondary | ICD-10-CM | POA: Diagnosis not present

## 2018-05-21 DIAGNOSIS — K859 Acute pancreatitis without necrosis or infection, unspecified: Secondary | ICD-10-CM | POA: Diagnosis not present

## 2018-05-23 ENCOUNTER — Encounter (HOSPITAL_COMMUNITY): Payer: Self-pay | Admitting: Emergency Medicine

## 2018-05-23 ENCOUNTER — Emergency Department (HOSPITAL_COMMUNITY)
Admission: EM | Admit: 2018-05-23 | Discharge: 2018-05-23 | Disposition: A | Discharge status: Home / Self Care | Payer: PPO | Attending: Emergency Medicine | Admitting: Emergency Medicine

## 2018-05-23 ENCOUNTER — Other Ambulatory Visit: Payer: Self-pay

## 2018-05-23 DIAGNOSIS — R112 Nausea with vomiting, unspecified: Secondary | ICD-10-CM | POA: Insufficient documentation

## 2018-05-23 DIAGNOSIS — E039 Hypothyroidism, unspecified: Secondary | ICD-10-CM | POA: Diagnosis not present

## 2018-05-23 DIAGNOSIS — I10 Essential (primary) hypertension: Secondary | ICD-10-CM | POA: Diagnosis not present

## 2018-05-23 DIAGNOSIS — R1114 Bilious vomiting: Secondary | ICD-10-CM | POA: Diagnosis not present

## 2018-05-23 DIAGNOSIS — E531 Pyridoxine deficiency: Secondary | ICD-10-CM | POA: Diagnosis not present

## 2018-05-23 DIAGNOSIS — Z79899 Other long term (current) drug therapy: Secondary | ICD-10-CM | POA: Diagnosis not present

## 2018-05-23 DIAGNOSIS — Z8551 Personal history of malignant neoplasm of bladder: Secondary | ICD-10-CM | POA: Insufficient documentation

## 2018-05-23 DIAGNOSIS — F17210 Nicotine dependence, cigarettes, uncomplicated: Secondary | ICD-10-CM | POA: Insufficient documentation

## 2018-05-23 DIAGNOSIS — E860 Dehydration: Secondary | ICD-10-CM | POA: Diagnosis not present

## 2018-05-23 DIAGNOSIS — R1031 Right lower quadrant pain: Secondary | ICD-10-CM | POA: Insufficient documentation

## 2018-05-23 DIAGNOSIS — Z7982 Long term (current) use of aspirin: Secondary | ICD-10-CM | POA: Diagnosis not present

## 2018-05-23 DIAGNOSIS — R110 Nausea: Secondary | ICD-10-CM | POA: Diagnosis not present

## 2018-05-23 HISTORY — DX: Malignant (primary) neoplasm, unspecified: C80.1

## 2018-05-23 LAB — CBC
HCT: 37.9 % (ref 36.0–46.0)
Hemoglobin: 12.6 g/dL (ref 12.0–15.0)
MCH: 31.3 pg (ref 26.0–34.0)
MCHC: 33.2 g/dL (ref 30.0–36.0)
MCV: 94.3 fL (ref 80.0–100.0)
Platelets: 505 K/uL — ABNORMAL HIGH (ref 150–400)
RBC: 4.02 MIL/uL (ref 3.87–5.11)
RDW: 13.7 % (ref 11.5–15.5)
WBC: 7.5 K/uL (ref 4.0–10.5)
nRBC: 0 % (ref 0.0–0.2)

## 2018-05-23 LAB — URINALYSIS, ROUTINE W REFLEX MICROSCOPIC
Bilirubin Urine: NEGATIVE
Glucose, UA: NEGATIVE mg/dL
Ketones, ur: NEGATIVE mg/dL
Nitrite: NEGATIVE
Protein, ur: NEGATIVE mg/dL
Specific Gravity, Urine: 1.01 (ref 1.005–1.030)
pH: 7 (ref 5.0–8.0)

## 2018-05-23 LAB — COMPREHENSIVE METABOLIC PANEL
ALT: 54 U/L — ABNORMAL HIGH (ref 0–44)
AST: 24 U/L (ref 15–41)
Albumin: 3.3 g/dL — ABNORMAL LOW (ref 3.5–5.0)
Alkaline Phosphatase: 225 U/L — ABNORMAL HIGH (ref 38–126)
Anion gap: 8 (ref 5–15)
BUN: 22 mg/dL (ref 8–23)
CO2: 28 mmol/L (ref 22–32)
Calcium: 9 mg/dL (ref 8.9–10.3)
Chloride: 98 mmol/L (ref 98–111)
Creatinine, Ser: 0.67 mg/dL (ref 0.44–1.00)
GFR calc Af Amer: >60 mL/min (ref >60)
GFR calc non Af Amer: >60 mL/min (ref >60)
Glucose, Bld: 112 mg/dL — ABNORMAL HIGH (ref 70–99)
Potassium: 3.3 mmol/L — ABNORMAL LOW (ref 3.5–5.1)
Sodium: 134 mmol/L — ABNORMAL LOW (ref 135–145)
Total Bilirubin: 0.3 mg/dL (ref 0.3–1.2)
Total Protein: 6.2 g/dL — ABNORMAL LOW (ref 6.5–8.1)

## 2018-05-23 LAB — LIPASE, BLOOD: LIPASE: 40 U/L (ref 11–51)

## 2018-05-23 MED ORDER — ONDANSETRON HCL 4 MG/2ML IJ SOLN
4.0000 mg | Freq: Once | INTRAMUSCULAR | Status: AC
  Administered 2018-05-23: 4 mg via INTRAVENOUS
  Filled 2018-05-23: qty 2

## 2018-05-23 MED ORDER — SODIUM CHLORIDE 0.9 % IV BOLUS
1000.0000 mL | Freq: Once | INTRAVENOUS | Status: AC
  Administered 2018-05-23: 1000 mL via INTRAVENOUS

## 2018-05-23 NOTE — ED Provider Notes (Signed)
[facility] EMERGENCY DEPARTMENT Provider Note   CSN: [id number] Arrival date & time: 05/23/18  1043     History   Chief Complaint Chief Complaint  Patient presents with  . Dehydration    HPI Rhonda Cook is a 73 y.o. female.  HPI Patient with a history of bladder cancer, anticipated surgery next week now presents with concern of ongoing nausea, weakness, intermittent right-sided abdominal pain. Patient had evaluation including cystoscopy, with diagnosis of tumors in the bladder, within the past weeks She has also had recent CT scan demonstrating some concern for pancreatitis. Reportedly recent labs, from a few days ago, were abnormal, with hyponatremia, elevated liver enzymes, these are unavailable. Patient notes that over the past 3 or 4 days in particular she has had worsening generalized weakness, persistent nausea, and episodes of right-sided lateral abdominal pain, described as sore, crampy, brief. No new fever, no dyspnea, no chest pain She has had episodes of vomiting. She has not started chemotherapy, nor radiation therapy yet.  She is here with her sister who assists with the HPI.  Past Medical History:  Diagnosis Date  . Arthritis   . Cancer [facility])    Bladder (2020)  . Diverticulitis   . Hyperlipidemia   . Hypertension   . Hypothyroidism   . PONV (postoperative nausea and vomiting)     Patient Active Problem List   Diagnosis Date Noted  . 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 HEMANGIOMA OF UNSPECIFIED SITE 07/22/2007  . 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 CERVICALGIA 07/22/2007  . SPINAL STENOSIS 07/22/2007    Past Surgical History:  Procedure Laterality Date  . CHOLECYSTECTOMY    . COLONOSCOPY N/A 12/15/2015   Procedure: COLONOSCOPY;  Surgeon: Rogene Houston, MD;  Location: [facility];  Service: Endoscopy;  Laterality: N/A;  730 - moved to 8/16 @ 7:30  . FOOT SURGERY Right    2018     OB History   No obstetric history  on file.      Home Medications    Prior to Admission medications   Medication Sig Start Date End Date Taking? Authorizing Provider  albuterol (PROAIR HFA) 108 (90 Base) MCG/ACT inhaler TAKE 2 PUFFS BY MOUTH EVERY 4 HOURS AS NEEDED 03/26/17  Yes [provider]  albuterol (PROVENTIL) (2.5 MG/3ML) 0.083% nebulizer solution INHALE 1 VIAL USING NEBULIZER EVERY 6 HOURS AS NEEDED 03/09/17  Yes [provider]  Ascorbic Acid (VITAMIN C) 1000 MG tablet Take 1,000 mg by mouth daily.   Yes [provider]  cholecalciferol (VITAMIN D) 1000 units tablet Take 1,000 Units by mouth daily. 10/12/17  Yes [provider]  denosumab (PROLIA) 60 MG/ML SOSY injection Inject 60 mg into the skin every 6 (six) months.   Yes [provider]  levothyroxine (SYNTHROID, LEVOTHROID) 75 MCG tablet  04/18/18  Yes [provider]  lisinopril-hydrochlorothiazide (PRINZIDE,ZESTORETIC) 10-12.5 MG tablet  04/06/18  Yes [provider]  nitrofurantoin, macrocrystal-monohydrate, (MACROBID) 100 MG capsule Take 100 mg by mouth daily.  05/08/18  Yes [provider]  Probiotic Product (PROBIOTIC-10 PO) Take 1 capsule by mouth daily.   Yes [provider]  simvastatin (ZOCOR) 20 MG tablet Take 20 mg by mouth daily. 11/29/15  Yes [provider]  traMADol (ULTRAM) 50 MG tablet Take 50 mg by mouth daily.  05/20/18  Yes [provider]  alendronate (FOSAMAX) 70 MG tablet Take 70 mg by mouth once a week. 11/04/15   [provider]  aspirin EC 81 MG tablet Take 1 tablet (81 mg total) by mouth daily. 12/23/15   Rogene Houston, MD  Calcium 200 MG TABS Take 400 each by mouth daily. 10/12/17   [provider]  levothyroxine (SYNTHROID, LEVOTHROID) 50 MCG tablet Take 75 mcg by mouth daily.  10/05/15   [provider]  lisinopril (PRINIVIL,ZESTRIL) 10 MG tablet Take 10 mg by mouth daily.    [provider]    Family  History No family history on file.  Social History Social History   Tobacco Use  . Smoking status: Current Every Day Smoker    Packs/day: 0.50    Types: Cigarettes  . Smokeless tobacco: Never Used  Substance Use Topics  . Alcohol use: No  . Drug use: No     Allergies   Oxycodone and Penicillins   Review of Systems Review of Systems  Constitutional:       Per HPI, otherwise negative  HENT:       Per HPI, otherwise negative  Respiratory:       Per HPI, otherwise negative  Cardiovascular:       Per HPI, otherwise negative  Gastrointestinal: Positive for abdominal pain, nausea and vomiting.  Endocrine:       Negative aside from HPI  Genitourinary:       Neg aside from HPI   Musculoskeletal:       Per HPI, otherwise negative  Skin: Negative.   Neurological: Negative for syncope.  Hematological:       Malignancy, early in evaluation     Physical Exam Updated Vital Signs BP 120/67 (BP Location: Right Arm)   Pulse 87   Temp 97.7 F (36.5 C) (Oral)   Resp 16   Ht 5\' 3"  (1.6 m)   Wt 55.8 kg   SpO2 100%   BMI 21.79 kg/m   Physical Exam Vitals signs and nursing note reviewed.  Constitutional:      General: She is not in acute distress.    Comments: Sickly appearing elderly female awake and alert  HENT:     Head: Normocephalic and atraumatic.  Eyes:     Conjunctiva/sclera: Conjunctivae normal.  Cardiovascular:     Rate and Rhythm: Normal rate and regular rhythm.  Pulmonary:     Effort: Pulmonary effort is normal. No respiratory distress.     Breath sounds: Normal breath sounds. No stridor.  Abdominal:     General: There is no distension.    Skin:    General: Skin is warm and dry.  Neurological:     Mental Status: She is alert and oriented to person, place, and time.     Cranial Nerves: No cranial nerve deficit.      ED Treatments / Results  Labs (all labs ordered are listed, but only abnormal results are displayed) Labs Reviewed    COMPREHENSIVE METABOLIC PANEL - Abnormal; Notable for the following components:      Result Value   Sodium 134 (*)    Potassium 3.3 (*)    Glucose, Bld 112 (*)    Total Protein 6.2 (*)    Albumin 3.3 (*)    ALT 54 (*)    Alkaline Phosphatase 225 (*)    All other components within normal limits  CBC - Abnormal; Notable for the following components:   Platelets 505 (*)    All other components within normal limits  URINALYSIS, ROUTINE W REFLEX MICROSCOPIC - Abnormal; Notable for the following components:   APPearance  HAZY (*)    Hgb urine dipstick SMALL (*)    Leukocytes, UA MODERATE (*)    Bacteria, UA RARE (*)    All other components within normal limits  LIPASE, BLOOD    EKG None  Radiology No results found.  Procedures Procedures (including critical care time)  Medications Ordered in ED Medications  sodium chloride 0.9 % bolus 1,000 mL (1,000 mLs Intravenous New Bag/Given 05/23/18 1306)  ondansetron (ZOFRAN) injection 4 mg (4 mg Intravenous Given 05/23/18 1306)     Initial Impression / Assessment and Plan / ED Course  I have reviewed the triage vital signs and the nursing notes.  Pertinent labs & imaging results that were available during my care of the patient were reviewed by me and considered in my medical decision making (see chart for details).    After the initial evaluation I reviewed the patient's chart including documentation from recent ED visits, CT imaging, concerning for bladder wall thickening, nonspecific changes in the pancreas. Urology outpatient visit note not available. 1:46 PM On repeat exam patient is awake, alert, speaking clearly. She is receiving IV fluids, has no new complaints. I reviewed findings with her and her sister, no notable substantial abnormalities aside from borderline low sodium and potassium values. Patient also has some evidence for malnutrition, with low albumin, protein values as well. However, she is feeling better,  receiving IV fluids, hemodynamically unremarkable, has no evidence for acute new pathology, given her improvement here following fluid resuscitation, the patient is appropriate for discharge with outpatient follow-up.  Final Clinical Impressions(s) / ED Diagnoses  Nausea and vomiting Dehydration   Carmin Muskrat, MD 05/23/18 1347

## 2018-05-23 NOTE — ED Triage Notes (Signed)
Pt sent over by PCP for dehydration and IV fluids. Pt reports intermittent n/v and loss of appetite since September. Pt recently diagnoses with bladder cancer.

## 2018-05-23 NOTE — Discharge Instructions (Addendum)
As discussed, your evaluation today has been largely reassuring.  But, it is important that you monitor your condition carefully, and do not hesitate to return to the ED if you develop new, or concerning changes in your condition. ? ?Otherwise, please follow-up with your physician for appropriate ongoing care. ? ?

## 2018-05-27 ENCOUNTER — Other Ambulatory Visit: Payer: Self-pay

## 2018-05-27 ENCOUNTER — Encounter (HOSPITAL_BASED_OUTPATIENT_CLINIC_OR_DEPARTMENT_OTHER): Payer: Self-pay | Admitting: *Deleted

## 2018-05-27 NOTE — Progress Notes (Signed)
Spoke with Sharyon Npo after midnight food, clear lqiuids until 815 am then npo Arrive 1215 pm 1-320 [facility] meds to take: Albuterol inhaler prn and bring inhaler, albuterol nebulizer, levothyroxine, nitrofuratonin, ondanestron Records on chart/epic: cmet, cbc, lipase, ua done 05-23-18 Needs ekg Has surgery orders in epic Spouse frank driver

## 2018-05-31 ENCOUNTER — Encounter (HOSPITAL_BASED_OUTPATIENT_CLINIC_OR_DEPARTMENT_OTHER): Payer: Self-pay

## 2018-05-31 ENCOUNTER — Encounter (HOSPITAL_BASED_OUTPATIENT_CLINIC_OR_DEPARTMENT_OTHER)
Admission: RE | Disposition: A | Discharge status: Home / Self Care | Payer: Self-pay | Source: Home / Self Care | Attending: Urology

## 2018-05-31 ENCOUNTER — Ambulatory Visit (HOSPITAL_BASED_OUTPATIENT_CLINIC_OR_DEPARTMENT_OTHER): Payer: PPO | Admitting: Anesthesiology

## 2018-05-31 ENCOUNTER — Ambulatory Visit (HOSPITAL_BASED_OUTPATIENT_CLINIC_OR_DEPARTMENT_OTHER)
Admission: RE | Admit: 2018-05-31 | Discharge: 2018-05-31 | Disposition: A | Discharge status: Home / Self Care | Payer: PPO | Attending: Urology | Admitting: Urology

## 2018-05-31 DIAGNOSIS — E039 Hypothyroidism, unspecified: Secondary | ICD-10-CM | POA: Insufficient documentation

## 2018-05-31 DIAGNOSIS — Z880 Allergy status to penicillin: Secondary | ICD-10-CM | POA: Diagnosis not present

## 2018-05-31 DIAGNOSIS — C672 Malignant neoplasm of lateral wall of bladder: Secondary | ICD-10-CM | POA: Insufficient documentation

## 2018-05-31 DIAGNOSIS — I10 Essential (primary) hypertension: Secondary | ICD-10-CM | POA: Diagnosis not present

## 2018-05-31 DIAGNOSIS — E785 Hyperlipidemia, unspecified: Secondary | ICD-10-CM | POA: Insufficient documentation

## 2018-05-31 DIAGNOSIS — Z885 Allergy status to narcotic agent status: Secondary | ICD-10-CM | POA: Insufficient documentation

## 2018-05-31 DIAGNOSIS — C679 Malignant neoplasm of bladder, unspecified: Secondary | ICD-10-CM | POA: Diagnosis not present

## 2018-05-31 DIAGNOSIS — D414 Neoplasm of uncertain behavior of bladder: Secondary | ICD-10-CM | POA: Diagnosis not present

## 2018-05-31 DIAGNOSIS — Z9049 Acquired absence of other specified parts of digestive tract: Secondary | ICD-10-CM | POA: Diagnosis not present

## 2018-05-31 DIAGNOSIS — R310 Gross hematuria: Secondary | ICD-10-CM | POA: Diagnosis not present

## 2018-05-31 DIAGNOSIS — Z7982 Long term (current) use of aspirin: Secondary | ICD-10-CM | POA: Diagnosis not present

## 2018-05-31 DIAGNOSIS — D494 Neoplasm of unspecified behavior of bladder: Secondary | ICD-10-CM | POA: Diagnosis not present

## 2018-05-31 DIAGNOSIS — F17210 Nicotine dependence, cigarettes, uncomplicated: Secondary | ICD-10-CM | POA: Insufficient documentation

## 2018-05-31 DIAGNOSIS — M1990 Unspecified osteoarthritis, unspecified site: Secondary | ICD-10-CM | POA: Insufficient documentation

## 2018-05-31 HISTORY — PX: TRANSURETHRAL RESECTION OF BLADDER TUMOR: SHX2575

## 2018-05-31 SURGERY — TURBT (TRANSURETHRAL RESECTION OF BLADDER TUMOR)
Anesthesia: General | Site: Bladder

## 2018-05-31 MED ORDER — CEFAZOLIN SODIUM-DEXTROSE 2-4 GM/100ML-% IV SOLN
INTRAVENOUS | Status: AC
  Filled 2018-05-31: qty 100

## 2018-05-31 MED ORDER — LACTATED RINGERS IV SOLN
INTRAVENOUS | Status: DC
  Filled 2018-05-31: qty 1000

## 2018-05-31 MED ORDER — PHENYLEPHRINE HCL 10 MG/ML IJ SOLN
INTRAMUSCULAR | Status: DC | PRN
  Administered 2018-05-31: 80 ug via INTRAVENOUS
  Administered 2018-05-31: 40 ug via INTRAVENOUS
  Administered 2018-05-31: 80 ug via INTRAVENOUS
  Administered 2018-05-31: 40 ug via INTRAVENOUS
  Administered 2018-05-31 (×2): 80 ug via INTRAVENOUS

## 2018-05-31 MED ORDER — FENTANYL CITRATE (PF) 100 MCG/2ML IJ SOLN
INTRAMUSCULAR | Status: AC
  Filled 2018-05-31: qty 2

## 2018-05-31 MED ORDER — LIDOCAINE 2% (20 MG/ML) 5 ML SYRINGE
INTRAMUSCULAR | Status: AC
  Filled 2018-05-31: qty 5

## 2018-05-31 MED ORDER — OXYBUTYNIN CHLORIDE 5 MG PO TABS
ORAL_TABLET | ORAL | Status: AC
  Filled 2018-05-31: qty 1

## 2018-05-31 MED ORDER — CEFAZOLIN SODIUM-DEXTROSE 2-4 GM/100ML-% IV SOLN
2.0000 g | Freq: Once | INTRAVENOUS | Status: AC
  Administered 2018-05-31: 2 g via INTRAVENOUS
  Filled 2018-05-31: qty 100

## 2018-05-31 MED ORDER — ROCURONIUM BROMIDE 100 MG/10ML IV SOLN
INTRAVENOUS | Status: DC | PRN
  Administered 2018-05-31: 40 mg via INTRAVENOUS

## 2018-05-31 MED ORDER — FENTANYL CITRATE (PF) 100 MCG/2ML IJ SOLN
INTRAMUSCULAR | Status: DC | PRN
  Administered 2018-05-31 (×2): 50 ug via INTRAVENOUS

## 2018-05-31 MED ORDER — SULFAMETHOXAZOLE-TRIMETHOPRIM 800-160 MG PO TABS: 1.0000 | ORAL_TABLET | Freq: Two times a day (BID) | ORAL | 0 refills | Status: AC

## 2018-05-31 MED ORDER — GEMCITABINE CHEMO FOR BLADDER INSTILLATION 2000 MG: 2000.0000 mg | Freq: Once | INTRAVENOUS | Status: DC

## 2018-05-31 MED ORDER — GEMCITABINE CHEMO FOR BLADDER INSTILLATION 2000 MG
2000.0000 mg | Freq: Once | INTRAVENOUS | Status: AC
  Administered 2018-05-31: 2000 mg via INTRAVESICAL
  Filled 2018-05-31: qty 52.6

## 2018-05-31 MED ORDER — ONDANSETRON HCL 4 MG/2ML IJ SOLN
INTRAMUSCULAR | Status: AC
  Filled 2018-05-31: qty 2

## 2018-05-31 MED ORDER — PROPOFOL 10 MG/ML IV BOLUS
INTRAVENOUS | Status: DC | PRN
  Administered 2018-05-31: 30 mg via INTRAVENOUS
  Administered 2018-05-31: 120 mg via INTRAVENOUS

## 2018-05-31 MED ORDER — OXYBUTYNIN CHLORIDE 5 MG PO TABS: 5.0000 mg | ORAL_TABLET | Freq: Three times a day (TID) | ORAL | 1 refills | Status: DC | PRN

## 2018-05-31 MED ORDER — DEXAMETHASONE SODIUM PHOSPHATE 4 MG/ML IJ SOLN
INTRAMUSCULAR | Status: DC | PRN
  Administered 2018-05-31: 4 mg via INTRAVENOUS

## 2018-05-31 MED ORDER — LIDOCAINE HCL (CARDIAC) PF 100 MG/5ML IV SOSY
PREFILLED_SYRINGE | INTRAVENOUS | Status: DC | PRN
  Administered 2018-05-31: 60 mg via INTRAVENOUS

## 2018-05-31 MED ORDER — DEXAMETHASONE SODIUM PHOSPHATE 10 MG/ML IJ SOLN
INTRAMUSCULAR | Status: AC
  Filled 2018-05-31: qty 1

## 2018-05-31 MED ORDER — PHENAZOPYRIDINE HCL 200 MG PO TABS: 200.0000 mg | ORAL_TABLET | Freq: Three times a day (TID) | ORAL | 0 refills | Status: AC | PRN

## 2018-05-31 MED ORDER — SODIUM CHLORIDE 0.9 % IR SOLN
Status: DC | PRN
  Administered 2018-05-31 (×3): 3000 mL via INTRAVESICAL

## 2018-05-31 MED ORDER — SUGAMMADEX SODIUM 200 MG/2ML IV SOLN
INTRAVENOUS | Status: DC | PRN
  Administered 2018-05-31: 200 mg via INTRAVENOUS

## 2018-05-31 MED ORDER — LACTATED RINGERS IV SOLN
INTRAVENOUS | Status: DC
  Administered 2018-05-31: 13:00:00 via INTRAVENOUS
  Filled 2018-05-31: qty 1000

## 2018-05-31 MED ORDER — PROPOFOL 10 MG/ML IV BOLUS
INTRAVENOUS | Status: AC
  Filled 2018-05-31: qty 20

## 2018-05-31 MED ORDER — TRAMADOL HCL 50 MG PO TABS: 50.0000 mg | ORAL_TABLET | Freq: Four times a day (QID) | ORAL | 0 refills | Status: AC | PRN

## 2018-05-31 MED ORDER — ONDANSETRON HCL 4 MG/2ML IJ SOLN
INTRAMUSCULAR | Status: DC | PRN
  Administered 2018-05-31: 4 mg via INTRAVENOUS

## 2018-05-31 MED ORDER — PHENAZOPYRIDINE HCL 200 MG PO TABS
200.0000 mg | ORAL_TABLET | Freq: Once | ORAL | Status: AC
  Administered 2018-05-31: 200 mg via ORAL
  Filled 2018-05-31: qty 1

## 2018-05-31 MED ORDER — PHENAZOPYRIDINE HCL 100 MG PO TABS
ORAL_TABLET | ORAL | Status: AC
  Filled 2018-05-31: qty 2

## 2018-05-31 MED ORDER — OXYBUTYNIN CHLORIDE 5 MG PO TABS
5.0000 mg | ORAL_TABLET | Freq: Three times a day (TID) | ORAL | Status: DC
  Administered 2018-05-31: 5 mg via ORAL
  Filled 2018-05-31: qty 1

## 2018-05-31 MED ORDER — ONDANSETRON HCL 4 MG/2ML IJ SOLN
4.0000 mg | Freq: Once | INTRAMUSCULAR | Status: DC | PRN
  Filled 2018-05-31: qty 2

## 2018-05-31 MED ORDER — FENTANYL CITRATE (PF) 100 MCG/2ML IJ SOLN
25.0000 ug | INTRAMUSCULAR | Status: DC | PRN
  Administered 2018-05-31: 25 ug via INTRAVENOUS
  Administered 2018-05-31: 50 ug via INTRAVENOUS
  Administered 2018-05-31: 25 ug via INTRAVENOUS
  Filled 2018-05-31: qty 1

## 2018-05-31 SURGICAL SUPPLY — 20 items
BAG DRAIN URO-CYSTO SKYTR STRL (DRAIN) ×3 IMPLANT
BAG DRN UROCATH (DRAIN) ×1
BAG URINE DRAINAGE (UROLOGICAL SUPPLIES) ×2 IMPLANT
BAG URINE LEG 500ML (DRAIN) IMPLANT
CATH FOLEY 2WAY SLVR  5CC 18FR (CATHETERS) ×2
CATH FOLEY 2WAY SLVR 5CC 18FR (CATHETERS) IMPLANT
GLOVE BIO SURGEON STRL SZ7.5 (GLOVE) ×3 IMPLANT
GOWN STRL REUS W/TWL XL LVL3 (GOWN DISPOSABLE) ×3 IMPLANT
HOLDER FOLEY CATH W/STRAP (MISCELLANEOUS) ×2 IMPLANT
IV NS IRRIG 3000ML ARTHROMATIC (IV SOLUTION) ×6 IMPLANT
LOOP CUT BIPOLAR 24F LRG (ELECTROSURGICAL) ×2 IMPLANT
MANIFOLD NEPTUNE II (INSTRUMENTS) ×3 IMPLANT
NS IRRIG 500ML POUR BTL (IV SOLUTION) IMPLANT
PACK CYSTO (CUSTOM PROCEDURE TRAY) ×3 IMPLANT
SYRINGE IRR TOOMEY STRL 70CC (SYRINGE) ×2 IMPLANT
TUBE CONNECTING 12'X1/4 (SUCTIONS) ×1
TUBE CONNECTING 12X1/4 (SUCTIONS) ×2 IMPLANT
TUBING UROLOGY SET (TUBING) ×2 IMPLANT
WATER STERILE IRR 3000ML UROMA (IV SOLUTION) IMPLANT
WATER STERILE IRR 500ML POUR (IV SOLUTION) ×2 IMPLANT

## 2018-05-31 NOTE — H&P (Signed)
Urology Preoperative H&P   Chief Complaint: Bladder tumors   History of Present Illness: Rhonda Cook is a 73 y.o. female referred by Dr. Gerarda Fraction for a hematuria evaluation.  Past medical history: of diverticulitis, hypertension, hyperlipidemia and hypothyroidism.   Past surgical history: Lap Cholecystectomy (2000) and colonoscopy   -Smoking history: 0.75-1.5 ppd for the past 50+ years  -History of nephrolithiasis: Denies -Anti-coagulant/anti-platelet use: Takes a daily aspirin  -Hematologic disorders: Denies  -History of UTIs: She has had recurrent urinary tract infections along with intermittent episodes of gross hematuria since July 2019. 5 UTIs over the past 6 months that clear for a brief period of time after a course of abx and the recur. No urine culture available.  -History of GU surgery/trauma: Denies  -Personal/family history of GU malignancies: Her son had a nephrectomy due to [place]  -Estrogen status: Postmenopausal. She still has her uterus and ovaries. Not taking estrogen supplementation  05/17/18: Cystoscopy revealed multiple papillary tumors concerning for urothelial carcinoma  Labs 05/03/2018  Serum creatinine-0.58  Estimated GFR- greater than 60  WBC- 8.4  Hemoglobin- 14.0  CT abdomen/pelvis without contrast 05/03/2018 Impression: 1. Mild patchy intermediate density over the dependent portion of the bladder likely hemorrhage/clots, although bladder mass/polyp is possible. 2. Stable 1.6 cm hypodensity with subtle calcification over the lower pole of the left kidney likely a complicated cyst. <BR>3. Stable left adrenal adenomas 4. Diverticulosis of the colon    Past Medical History:  Diagnosis Date  . Arthritis   . Cancer [facility])    Bladder (2020)  . Diverticulitis   . Hyperlipidemia   . Hypertension   . Hypothyroidism   . PONV (postoperative nausea and vomiting)    slow to awaken, shakes if under too long    Past Surgical History:  Procedure Laterality Date  .  CHOLECYSTECTOMY    . COLONOSCOPY N/A 12/15/2015   Procedure: COLONOSCOPY;  Surgeon: Rogene Houston, MD;  [facility];  Service: Endoscopy;  Laterality: N/A;  730 - moved to 8/16 @ 7:30  . FOOT SURGERY Right    2018    Allergies:  Allergies  Allergen Reactions  . Oxycodone Itching and Nausea And Vomiting  . Penicillins Nausea And Vomiting and Rash    Did it involve swelling of the face/tongue/throat, SOB, or low BP? no Did it involve sudden or severe rash/hives, skin peeling, or any reaction on the inside of your mouth or nose? yes Did you need to seek medical attention at a hospital or doctor's office? no When did it last happen?20 years ago If all above answers are "NO", may proceed with cephalosporin use.    History reviewed. No pertinent family history.  Social History:  reports that she has been smoking cigarettes. She has been smoking about 0.50 packs per day. She has never used smokeless tobacco. She reports that she does not drink alcohol or use drugs.  ROS: A complete review of systems was performed.  All systems are negative except for pertinent findings as noted.  Physical Exam:  Vital signs in last 24 hours: Temp:  [97.8 F (36.6 C)] 97.8 F (36.6 C) (01/31 1213) Pulse Rate:  [92] 92 (01/31 1213) Resp:  [14] 14 (01/31 1213) BP: (98)/(48) 98/48 (01/31 1213) SpO2:  [96 %] 96 % (01/31 1213) Weight:  [55.6 kg] 55.6 kg (01/31 1213) Constitutional:  Alert and oriented, No acute distress Cardiovascular: Regular rate and rhythm, No JVD Respiratory: Normal respiratory effort, Lungs clear bilaterally GI: Abdomen is  soft, nontender, nondistended, no abdominal masses GU: No CVA tenderness Lymphatic: No lymphadenopathy Neurologic: Grossly intact, no focal deficits Psychiatric: Normal mood and affect  Laboratory Data:  No results for input(s): WBC, HGB, HCT, PLT in the last 72 hours.  No results for input(s): NA, K, CL, GLUCOSE, BUN, CALCIUM, CREATININE  in the last 72 hours.  Invalid input(s): CO3   No results found for this or any previous visit (from the past 24 hour(s)). No results found for this or any previous visit (from the past 240 hour(s)).  Renal Function: No results for input(s): CREATININE in the last 168 hours. Estimated Creatinine Clearance: 52.6 mL/min (by C-G formula based on SCr of 0.67 mg/dL).  Radiologic Imaging: No results found.  I independently reviewed the above imaging studies.  Assessment and Plan PEG FIFER is a 73 y.o. 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 The risks included, but are not limited to, bleeding, urinary tract infection, bladder perforation requiring prolonged catheterization and/or open bladder repair, ureteral obstruction, voiding dysfunction and the inherent risks of general anesthesia. The patient voices understanding and wishes to proceed.    Ellison Hughs, MD 05/31/2018, 12:54 PM  [facility] Pager: [phone]

## 2018-05-31 NOTE — Anesthesia Preprocedure Evaluation (Signed)
Anesthesia Evaluation  Patient identified by MRN, date of birth, ID band Patient awake    Reviewed: Allergy & Precautions, NPO status , Patient's Chart, lab work & pertinent test results  History of Anesthesia Complications (+) PONV and history of anesthetic complications  Airway Mallampati: II  TM Distance: <3 FB Neck ROM: Full    Dental  (+) Teeth Intact, Dental Advisory Given   Pulmonary Current Smoker,    Pulmonary exam normal breath sounds clear to auscultation       Cardiovascular hypertension, Pt. on medications Normal cardiovascular exam Rhythm:Regular Rate:Normal     Neuro/Psych negative neurological ROS  negative psych ROS   GI/Hepatic negative GI ROS, Neg liver ROS,   Endo/Other  Hypothyroidism   Renal/GU negative Renal ROS   BLADDER TUMORS    Musculoskeletal negative musculoskeletal ROS (+)   Abdominal   Peds  Hematology negative hematology ROS (+)   Anesthesia Other Findings Day of surgery medications reviewed with the patient.  Reproductive/Obstetrics                             Anesthesia Physical Anesthesia Plan  ASA: II  Anesthesia Plan: General   Post-op Pain Management:    Induction: Intravenous  PONV Risk Score and Plan: 4 or greater and Dexamethasone and Ondansetron  Airway Management Planned: Oral ETT  Additional Equipment:   Intra-op Plan:   Post-operative Plan: Extubation in OR  Informed Consent: I have reviewed the patients History and Physical, chart, labs and discussed the procedure including the risks, benefits and alternatives for the proposed anesthesia with the patient or authorized representative who has indicated his/her understanding and acceptance.     Dental advisory given  Plan Discussed with: CRNA  Anesthesia Plan Comments:         Anesthesia Quick Evaluation

## 2018-05-31 NOTE — Discharge Instructions (Signed)
CYSTOSCOPY HOME CARE INSTRUCTIONS  Activity: Rest for the remainder of the day.  Do not drive or operate equipment today.  You may resume normal activities in one to two days as instructed by your physician.   Meals: Drink plenty of liquids and eat light foods such as gelatin or soup this evening.  You may return to a normal meal plan tomorrow.  Return to Work: You may return to work in one to two days or as instructed by your physician.  Special Instructions / Symptoms: Call your physician if any of these symptoms occur:   -persistent or heavy bleeding  -bleeding which continues after first few urination  -large blood clots that are difficult to pass  -urine stream diminishes or stops completely  -fever equal to or higher than 101 degrees Farenheit.  -cloudy urine with a strong, foul odor  -severe pain  Females should always wipe from front to back after elimination.  You may feel some burning pain when you urinate.  This should disappear with time.  Applying moist heat to the lower abdomen or a hot tub bath may help relieve the pain.    Post Anesthesia Home Care Instructions  Activity: Get plenty of rest for the remainder of the day. A responsible individual must stay with you for 24 hours following the procedure.  For the next 24 hours, DO NOT: -Drive a car -Paediatric nurse -Drink alcoholic beverages -Take any medication unless instructed by your physician -Make any legal decisions or sign important papers.  Meals: Start with liquid foods such as gelatin or soup. Progress to regular foods as tolerated. Avoid greasy, spicy, heavy foods. If nausea and/or vomiting occur, drink only clear liquids until the nausea and/or vomiting subsides. Call your physician if vomiting continues.  Special Instructions/Symptoms: Your throat may feel dry or sore from the anesthesia or the breathing tube placed in your throat during surgery. If this causes discomfort, gargle with warm salt  water. The discomfort should disappear within 24 hours.

## 2018-05-31 NOTE — Transfer of Care (Signed)
Immediate Anesthesia Transfer of Care Note  Patient: Rhonda Cook  Procedure(s) Performed: TRANSURETHRAL RESECTION OF BLADDER TUMOR (TURBT) WITH CYSTOSCOPY, INSTILLATION OF GEMCITABINE IN PACU (N/A Bladder)  Patient Location: PACU  Anesthesia Type:General  Level of Consciousness: awake, alert  and oriented  Airway & Oxygen Therapy: Patient Spontanous Breathing and Patient connected to nasal cannula oxygen  Post-op Assessment: Report given to RN and Post -op Vital signs reviewed and stable  Post vital signs: Reviewed and stable  Last Vitals:  Vitals Value Taken Time  BP 134/91 05/31/2018  2:16 PM  Temp    Pulse 102 05/31/2018  2:18 PM  Resp    SpO2 96 % 05/31/2018  2:18 PM  Vitals shown include unvalidated device data.  Last Pain:  Vitals:   05/31/18 1229  TempSrc:   PainSc: 3       Patients Stated Pain Goal: 7 (82/88/33 7445)  Complications: No apparent anesthesia complications

## 2018-05-31 NOTE — Op Note (Signed)
Operative Note  Preoperative diagnosis:  1.  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  Same  Procedure(s): 1.  Cystoscopy with TURBT small 2.  Intravesical instillation of gemcitabine  Surgeon: Ellison Hughs, MD  Assistants:  None  Anesthesia:  General  Complications:  None  EBL: Less than 5 mL  Specimens: 1.  Right lateral bladder tumors  Drains/Catheters: 1.  18 French Foley catheter (to be removed 1 hour after gemcitabine instillation)  Intraoperative findings:   1. Multiple papillary bladder tumors involving the right lateral wall of the bladder, with the largest bladder tumor measuring 2 cm in greatest dimension. 2. No evidence of bladder perforation during resection of her bladder tumors, but her bladder was extremely thin, therefore, a separate deep margin was not obtained.  Indication:  Rhonda Cook is a 73 y.o. female with recent episodes of painless gross hematuria.  She had a CT urogram on 05/03/2018 that showed multiple intermediate density lesions involving right bladder wall.  She underwent cystoscopy in the office on 05/17/2018 and was found to have multiple papillary tumors involving the right lateral wall of the bladder.  She has been consented for the above procedures, voices understanding and wishes to proceed.  Description of procedure: The patient was taken to the operating room and administered general anesthesia. They were then placed on the table and moved to the dorsal lithotomy position after which the genitalia was sterilely prepped and draped. An official timeout was then performed.  The 82 French resectoscope with the 30 lens and visual obturator were then passed into the bladder under direct visualization. Urethra appeared normal. The visual obturator was then removed and the Gyrus resectoscope element with  30  lens was then inserted and the bladder was fully and systematically inspected. Ureteral orifices were noted to be in the normal anatomic positions.   I first began by resecting the 2 cm papillary tumor along the right lateral wall of the bladder.  There were multiple smaller papillary tumors immediately adjacent to this and extending up to the upper portions of the lateral wall the bladder.  These tumors were resected down to the detrusor musculature and then sent off for pathologic analysis.  My initial resection was already down to the detrusor musculature, therefore, I did not proceed with a deeper resection margin out of concern for bladder perforation.  Reinspection of the bladder revealed all obvious tumor had been fully resected and there was no evidence of perforation. The Microvasive evacuator was then used to irrigate the bladder and remove all of the portions of bladder tumor which were sent to pathology. I then removed the resectoscope.  An 32 French Foley catheter was then inserted in the bladder and irrigated. The irrigant returned slightly pink with no clots. The patient was awakened and taken to the recovery room.  While in the recovery room 2000 mg of gemcitabine in 50 mL of water was instilled in the bladder through the catheter and the catheter was plugged. This will remain indwelling for approximately one hour. It will then be drained from the bladder and the catheter will be removed and the patient discharged home.  PLAN OF CARE: Discharge to home after PACU  PATIENT DISPOSITION:  PACU - hemodynamically stable.

## 2018-05-31 NOTE — Anesthesia Procedure Notes (Signed)
Procedure Name: Intubation Date/Time: 05/31/2018 1:33 PM Performed by: Justice Rocher, CRNA Pre-anesthesia Checklist: Patient identified, Emergency Drugs available, Suction available and Patient being monitored Patient Re-evaluated:Patient Re-evaluated prior to induction Oxygen Delivery Method: Circle system utilized Preoxygenation: Pre-oxygenation with 100% oxygen Induction Type: IV induction Ventilation: Mask ventilation without difficulty Laryngoscope Size: Mac and 3 Grade View: Grade II Tube type: Oral Tube size: 7.0 mm Number of attempts: 1 Airway Equipment and Method: Stylet and Oral airway Placement Confirmation: ETT inserted through vocal cords under direct vision,  positive ETCO2 and breath sounds checked- equal and bilateral Secured at: 22 cm Tube secured with: Tape Dental Injury: Teeth and Oropharynx as per pre-operative assessment

## 2018-06-02 ENCOUNTER — Encounter (HOSPITAL_COMMUNITY): Payer: Self-pay | Admitting: *Deleted

## 2018-06-02 ENCOUNTER — Other Ambulatory Visit: Payer: Self-pay

## 2018-06-02 ENCOUNTER — Emergency Department (HOSPITAL_COMMUNITY)
Admission: EM | Admit: 2018-06-02 | Discharge: 2018-06-03 | Disposition: A | Discharge status: Home / Self Care | Payer: PPO | Attending: Emergency Medicine | Admitting: Emergency Medicine

## 2018-06-02 DIAGNOSIS — F17210 Nicotine dependence, cigarettes, uncomplicated: Secondary | ICD-10-CM | POA: Insufficient documentation

## 2018-06-02 DIAGNOSIS — C679 Malignant neoplasm of bladder, unspecified: Secondary | ICD-10-CM | POA: Diagnosis not present

## 2018-06-02 DIAGNOSIS — Z7982 Long term (current) use of aspirin: Secondary | ICD-10-CM | POA: Insufficient documentation

## 2018-06-02 DIAGNOSIS — E039 Hypothyroidism, unspecified: Secondary | ICD-10-CM | POA: Insufficient documentation

## 2018-06-02 DIAGNOSIS — N12 Tubulo-interstitial nephritis, not specified as acute or chronic: Secondary | ICD-10-CM | POA: Diagnosis not present

## 2018-06-02 DIAGNOSIS — Z79899 Other long term (current) drug therapy: Secondary | ICD-10-CM | POA: Diagnosis not present

## 2018-06-02 DIAGNOSIS — R112 Nausea with vomiting, unspecified: Secondary | ICD-10-CM | POA: Diagnosis not present

## 2018-06-02 DIAGNOSIS — I10 Essential (primary) hypertension: Secondary | ICD-10-CM | POA: Diagnosis not present

## 2018-06-02 NOTE — ED Triage Notes (Signed)
Pt c/o n/v, unsure of fever that started today, had bladder surgery Friday at [facility],

## 2018-06-03 ENCOUNTER — Encounter (HOSPITAL_BASED_OUTPATIENT_CLINIC_OR_DEPARTMENT_OTHER): Payer: Self-pay | Admitting: Urology

## 2018-06-03 LAB — CBC WITH DIFFERENTIAL/PLATELET
Abs Immature Granulocytes: 0.02 10*3/uL (ref 0.00–0.07)
BASOS PCT: 0 %
Basophils Absolute: 0 10*3/uL (ref 0.0–0.1)
Eosinophils Absolute: 0.1 10*3/uL (ref 0.0–0.5)
Eosinophils Relative: 1 %
HCT: 33 % — ABNORMAL LOW (ref 36.0–46.0)
Hemoglobin: 10.6 g/dL — ABNORMAL LOW (ref 12.0–15.0)
Immature Granulocytes: 0 %
Lymphocytes Relative: 5 %
Lymphs Abs: 0.4 10*3/uL — ABNORMAL LOW (ref 0.7–4.0)
MCH: 31.3 pg (ref 26.0–34.0)
MCHC: 32.1 g/dL (ref 30.0–36.0)
MCV: 97.3 fL (ref 80.0–100.0)
MONOS PCT: 1 %
Monocytes Absolute: 0.1 10*3/uL (ref 0.1–1.0)
NEUTROS PCT: 93 %
Neutro Abs: 7.9 10*3/uL — ABNORMAL HIGH (ref 1.7–7.7)
Platelets: 419 10*3/uL — ABNORMAL HIGH (ref 150–400)
RBC: 3.39 MIL/uL — ABNORMAL LOW (ref 3.87–5.11)
RDW: 14.1 % (ref 11.5–15.5)
WBC: 8.5 10*3/uL (ref 4.0–10.5)
nRBC: 0 % (ref 0.0–0.2)

## 2018-06-03 LAB — COMPREHENSIVE METABOLIC PANEL
ALT: 47 U/L — ABNORMAL HIGH (ref 0–44)
AST: 43 U/L — ABNORMAL HIGH (ref 15–41)
Albumin: 3.1 g/dL — ABNORMAL LOW (ref 3.5–5.0)
Alkaline Phosphatase: 171 U/L — ABNORMAL HIGH (ref 38–126)
Anion gap: 8 (ref 5–15)
BUN: 16 mg/dL (ref 8–23)
CO2: 25 mmol/L (ref 22–32)
Calcium: 8.1 mg/dL — ABNORMAL LOW (ref 8.9–10.3)
Chloride: 100 mmol/L (ref 98–111)
Creatinine, Ser: 0.84 mg/dL (ref 0.44–1.00)
GFR calc Af Amer: >60 mL/min (ref >60)
GFR calc non Af Amer: >60 mL/min (ref >60)
Glucose, Bld: 122 mg/dL — ABNORMAL HIGH (ref 70–99)
Potassium: 3.9 mmol/L (ref 3.5–5.1)
SODIUM: 133 mmol/L — AB (ref 135–145)
Total Bilirubin: 1 mg/dL (ref 0.3–1.2)
Total Protein: 5.9 g/dL — ABNORMAL LOW (ref 6.5–8.1)

## 2018-06-03 LAB — URINALYSIS, ROUTINE W REFLEX MICROSCOPIC
Bacteria, UA: NONE SEEN
Bilirubin Urine: NEGATIVE
GLUCOSE, UA: NEGATIVE mg/dL
Ketones, ur: 5 mg/dL — AB
Leukocytes, UA: NEGATIVE
Nitrite: POSITIVE — AB
PROTEIN: NEGATIVE mg/dL
RBC / HPF: >50 RBC/hpf — ABNORMAL HIGH (ref 0–5)
Specific Gravity, Urine: 1.006 (ref 1.005–1.030)
WBC, UA: >50 WBC/hpf — ABNORMAL HIGH (ref 0–5)
pH: 7 (ref 5.0–8.0)

## 2018-06-03 MED ORDER — SODIUM CHLORIDE 0.9 % IV SOLN
1.0000 g | Freq: Once | INTRAVENOUS | Status: AC
  Administered 2018-06-03: 1 g via INTRAVENOUS
  Filled 2018-06-03: qty 10

## 2018-06-03 MED ORDER — GENTAMICIN SULFATE 40 MG/ML IJ SOLN
5.0000 mg/kg | Freq: Once | INTRAVENOUS | Status: AC
  Administered 2018-06-03: 280 mg via INTRAVENOUS
  Filled 2018-06-03: qty 7

## 2018-06-03 MED ORDER — SODIUM CHLORIDE 0.9 % IV BOLUS
1000.0000 mL | Freq: Once | INTRAVENOUS | Status: AC
  Administered 2018-06-03: 1000 mL via INTRAVENOUS

## 2018-06-03 MED ORDER — ONDANSETRON HCL 4 MG/2ML IJ SOLN
4.0000 mg | Freq: Once | INTRAMUSCULAR | Status: AC
  Administered 2018-06-03: 4 mg via INTRAVENOUS
  Filled 2018-06-03: qty 2

## 2018-06-03 MED ORDER — ONDANSETRON 4 MG PO TBDP
4.0000 mg | ORAL_TABLET | Freq: Once | ORAL | Status: AC
  Administered 2018-06-03: 4 mg via ORAL
  Filled 2018-06-03: qty 1

## 2018-06-03 MED ORDER — CEPHALEXIN 500 MG PO CAPS: 500.0000 mg | ORAL_CAPSULE | Freq: Three times a day (TID) | ORAL | 0 refills | Status: DC

## 2018-06-03 MED ORDER — GENTAMICIN SULFATE 40 MG/ML IJ SOLN
INTRAMUSCULAR | Status: AC
  Filled 2018-06-03: qty 8

## 2018-06-03 MED ORDER — ONDANSETRON 8 MG PO TBDP: 8.0000 mg | ORAL_TABLET | Freq: Three times a day (TID) | ORAL | 0 refills | Status: DC | PRN

## 2018-06-03 NOTE — Discharge Instructions (Addendum)
We saw in the ER for nausea and vomiting. It appears that your symptoms are because of kidney infection.  Please start taking the antibiotics prescribed and call the urologist for an appointment on Wednesday.  If at any point your symptoms get worse, you start having fevers, severe pain or you are having difficulty keeping food and water down please come back to the any [facility] emergency room or [facility] emergency room.

## 2018-06-03 NOTE — Anesthesia Postprocedure Evaluation (Signed)
Anesthesia Post Note  Patient: Rhonda Cook  Procedure(s) Performed: TRANSURETHRAL RESECTION OF BLADDER TUMOR (TURBT) WITH CYSTOSCOPY, INSTILLATION OF GEMCITABINE IN PACU (N/A Bladder)     Patient location during evaluation: PACU Anesthesia Type: General Level of consciousness: awake and alert Pain management: pain level controlled Vital Signs Assessment: post-procedure vital signs reviewed and stable Respiratory status: spontaneous breathing, nonlabored ventilation, respiratory function stable and patient connected to nasal cannula oxygen Cardiovascular status: blood pressure returned to baseline and stable Postop Assessment: no apparent nausea or vomiting Anesthetic complications: no    Last Vitals:  Vitals:   05/31/18 1545 05/31/18 1630  BP:  113/60  Pulse: 78 88  Resp: (!) 27 14  Temp:  37.2 C  SpO2: 91% 96%    Last Pain:  Vitals:   05/31/18 1630  TempSrc:   PainSc: 1                  Catalina Gravel

## 2018-06-03 NOTE — ED Provider Notes (Signed)
[facility] EMERGENCY DEPARTMENT Provider Note   CSN: [id number] Arrival date & time: 06/02/18  2328     History   Chief Complaint Chief Complaint  Patient presents with  . Emesis    HPI Rhonda Cook is a 73 y.o. female.  HPI  73 year old female with history of bladder cancer who is postop day #3 from a recent bladder surgery, hypertension, hyperlipidemia, diverticulosis comes in with chief complaint of nausea and vomiting.  Patient reports that she woke up this morning with nausea and started having emesis soon after.  Patient has had about 10 episodes of emesis and now she is dry heaving.  She states that within a few minutes of her eating anything she will start vomiting.  Now she is feeling weak and having some dizziness.  Patient denies any abdominal pain that is new.  She is having some right lower quadrant pain, however that pain is intermittent and not new.  She denies any UTI-like symptoms, vaginal discharge or bleeding, new back pain.  Patient felt warm but she does not know if she had a fever.  Past Medical History:  Diagnosis Date  . Arthritis   . Cancer [facility])    Bladder (2020)  . Diverticulitis   . Hyperlipidemia   . Hypertension   . Hypothyroidism   . PONV (postoperative nausea and vomiting)    slow to awaken, shakes if under too long    Patient Active Problem List   Diagnosis Date Noted  . 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 HEMANGIOMA OF UNSPECIFIED SITE 07/22/2007  . 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 CERVICALGIA 07/22/2007  . SPINAL STENOSIS 07/22/2007    Past Surgical History:  Procedure Laterality Date  . CHOLECYSTECTOMY    . COLONOSCOPY N/A 12/15/2015   Procedure: COLONOSCOPY;  Surgeon: Rogene Houston, MD;  Location: [facility];  Service: Endoscopy;  Laterality: N/A;  730 - moved to 8/16 @ 7:30  . FOOT SURGERY Right    2018  . TRANSURETHRAL RESECTION OF BLADDER TUMOR N/A 05/31/2018   Procedure: TRANSURETHRAL  RESECTION OF BLADDER TUMOR (TURBT) WITH CYSTOSCOPY, INSTILLATION OF GEMCITABINE IN PACU;  Surgeon: Ceasar Mons, MD;  Location: [facility];  Service: Urology;  Laterality: N/A;     OB History   No obstetric history on file.      Home Medications    Prior to Admission medications   Medication Sig Start Date End Date Taking? Authorizing Provider  albuterol (PROAIR HFA) 108 (90 Base) MCG/ACT inhaler TAKE 2 PUFFS BY MOUTH EVERY 4 HOURS AS NEEDED 03/26/17   [provider]  albuterol (PROVENTIL) (2.5 MG/3ML) 0.083% nebulizer solution INHALE 1 VIAL USING NEBULIZER EVERY 6 HOURS AS NEEDED 03/09/17   [provider]  alendronate (FOSAMAX) 70 MG tablet Take 70 mg by mouth once a week. 11/04/15   [provider]  Ascorbic Acid (VITAMIN C) 1000 MG tablet Take 1,000 mg by mouth daily.    [provider]  aspirin EC 81 MG tablet Take 1 tablet (81 mg total) by mouth daily. 12/23/15   Rogene Houston, MD  Calcium 200 MG TABS Take 400 each by mouth daily. 10/12/17   [provider]  cephALEXin (KEFLEX) 500 MG capsule Take 1 capsule (500 mg total) by mouth 3 (three) times daily. 06/03/18   Varney Biles, MD  cholecalciferol (VITAMIN D) 1000 units tablet Take 1,000 Units by mouth daily. 10/12/17   [provider]  denosumab (PROLIA) 60  MG/ML SOSY injection Inject 60 mg into the skin every 6 (six) months.    [provider]  levothyroxine (SYNTHROID, LEVOTHROID) 75 MCG tablet  04/18/18   [provider]  lisinopril-hydrochlorothiazide (PRINZIDE,ZESTORETIC) 10-12.5 MG tablet  04/06/18   [provider]  nitrofurantoin, macrocrystal-monohydrate, (MACROBID) 100 MG capsule Take 100 mg by mouth daily.  05/08/18   [provider]  ondansetron (ZOFRAN ODT) 8 MG disintegrating tablet Take 1 tablet (8 mg total) by mouth every 8 (eight) hours as needed for nausea. 06/03/18   Varney Biles, MD  oxybutynin  (DITROPAN) 5 MG tablet Take 1 tablet (5 mg total) by mouth every 8 (eight) hours as needed for bladder spasms. 05/31/18   Ceasar Mons, MD  phenazopyridine (PYRIDIUM) 200 MG tablet Take 1 tablet (200 mg total) by mouth 3 (three) times daily as needed (for pain with urination). 05/31/18 05/31/19  Ceasar Mons, MD  Probiotic Product (PROBIOTIC-10 PO) Take 1 capsule by mouth daily.    [provider]  simvastatin (ZOCOR) 20 MG tablet Take 20 mg by mouth at bedtime.  11/29/15   [provider]  traMADol (ULTRAM) 50 MG tablet Take 50 mg by mouth daily.  05/20/18   [provider]    Family History No family history on file.  Social History Social History   Tobacco Use  . Smoking status: Current Every Day Smoker    Packs/day: 0.50    Types: Cigarettes  . Smokeless tobacco: Never Used  Substance Use Topics  . Alcohol use: No  . Drug use: No     Allergies   Oxycodone and Penicillins   Review of Systems Review of Systems  Constitutional: Positive for activity change.  Gastrointestinal: Positive for abdominal pain, nausea and vomiting.  Genitourinary: Negative for dysuria and vaginal bleeding.  Allergic/Immunologic: Negative for immunocompromised state.  Hematological: Does not bruise/bleed easily.  All other systems reviewed and are negative.    Physical Exam Updated Vital Signs BP (!) 108/52   Pulse 72   Temp 99.4 F (37.4 C) (Oral)   Resp 18   Ht 5\' 3"  (1.6 m)   Wt 55.6 kg   SpO2 93%   BMI 21.71 kg/m   Physical Exam Vitals signs and nursing note reviewed.  Constitutional:      Appearance: She is well-developed.  HENT:     Head: Normocephalic and atraumatic.     Mouth/Throat:     Mouth: Mucous membranes are dry.  Eyes:     General: No scleral icterus. Neck:     Musculoskeletal: Normal range of motion and neck supple.  Cardiovascular:     Rate and Rhythm: Normal rate.  Pulmonary:     Effort: Pulmonary effort is  normal.  Abdominal:     General: Bowel sounds are normal.     Palpations: Abdomen is soft.     Tenderness: There is abdominal tenderness. There is no guarding or rebound.  Skin:    General: Skin is warm and dry.  Neurological:     Mental Status: She is alert and oriented to person, place, and time.      ED Treatments / Results  Labs (all labs ordered are listed, but only abnormal results are displayed) Labs Reviewed  COMPREHENSIVE METABOLIC PANEL - Abnormal; Notable for the following components:      Result Value   Sodium 133 (*)    Glucose, Bld 122 (*)    Calcium 8.1 (*)    Total Protein 5.9 (*)  Albumin 3.1 (*)    AST 43 (*)    ALT 47 (*)    Alkaline Phosphatase 171 (*)    All other components within normal limits  CBC WITH DIFFERENTIAL/PLATELET - Abnormal; Notable for the following components:   RBC 3.39 (*)    Hemoglobin 10.6 (*)    HCT 33.0 (*)    Platelets 419 (*)    Neutro Abs 7.9 (*)    Lymphs Abs 0.4 (*)    All other components within normal limits  URINALYSIS, ROUTINE W REFLEX MICROSCOPIC - Abnormal; Notable for the following components:   Color, Urine AMBER (*)    Hgb urine dipstick LARGE (*)    Ketones, ur 5 (*)    Nitrite POSITIVE (*)    RBC / HPF >50 (*)    WBC, UA >50 (*)    All other components within normal limits  URINE CULTURE    EKG None  Radiology No results found.  Procedures Procedures (including critical care time)  Medications Ordered in ED Medications  sodium chloride 0.9 % bolus 1,000 mL (0 mLs Intravenous Stopped 06/03/18 0202)  ondansetron (ZOFRAN) injection 4 mg (4 mg Intravenous Given 06/03/18 0101)  cefTRIAXone (ROCEPHIN) 1 g in sodium chloride 0.9 % 100 mL IVPB (0 g Intravenous Stopped 06/03/18 0555)  ondansetron (ZOFRAN-ODT) disintegrating tablet 4 mg (4 mg Oral Given 06/03/18 0405)  gentamicin (GARAMYCIN) 280 mg in dextrose 5 % 50 mL IVPB (0 mg/kg  55.6 kg Intravenous Stopped 06/03/18 0631)     Initial Impression /  Assessment and Plan / ED Course  I have reviewed the triage vital signs and the nursing notes.  Pertinent labs & imaging results that were available during my care of the patient were reviewed by me and considered in my medical decision making (see chart for details).  Clinical Course as of Jun 05 1845  Mon Jun 03, 2018  0708 I discussed the case with the urologist on call. We discussed the UA with some equivocal findings and the symptoms.  He wants [place] to give patient 1 round of IV gentamicin and start her on keflex. Also to give her ceftriaxone now and ask her to stop bacitracin. Results discussed with the patient who is not vomiting, but still feels nauseous.   Pt reassessed. Pt's VSS and WNL. Pt's cap refill < 3 seconds. Pt has been hydrated in the ER and now passed po challenge. We will discharge with antiemetic. Strict ER return precautions have been discussed and pt will return if he is unable to tolerate fluids and symptoms are getting worse.    [AN]    Clinical Course User Index [AN] Varney Biles, MD    73 year old female comes in with chief complaint of nausea and vomiting.  Patient has been having dry heaving since she woke up and has had multiple episodes of emesis.  She is noted to have no focal abdominal tenderness and there is no peritoneal findings.  Patient does appear to be dry and she is feeling dizzy.  Plan is to get basic labs, hydrate her and to reassess.  At this time I do not see any reason to image.  Patient if she continues to feel unwell then we will have to get a CAT scan to ensure there is no SBO and also no complications from the recent bladder surgery.  Final Clinical Impressions(s) / ED Diagnoses   Final diagnoses:  Pyelonephritis    ED Discharge Orders  Ordered    ondansetron (ZOFRAN ODT) 8 MG disintegrating tablet  Every 8 hours PRN     06/03/18 0611    cephALEXin (KEFLEX) 500 MG capsule  3 times daily     06/03/18 9628             Varney Biles, MD 06/05/18 [id number]

## 2018-06-04 LAB — URINE CULTURE: Culture: NO GROWTH

## 2018-06-14 DIAGNOSIS — C672 Malignant neoplasm of lateral wall of bladder: Secondary | ICD-10-CM | POA: Diagnosis not present

## 2018-06-19 ENCOUNTER — Other Ambulatory Visit: Payer: Self-pay | Admitting: Urology

## 2018-07-01 DIAGNOSIS — Z6820 Body mass index (BMI) 20.0-20.9, adult: Secondary | ICD-10-CM | POA: Diagnosis not present

## 2018-07-01 DIAGNOSIS — J209 Acute bronchitis, unspecified: Secondary | ICD-10-CM | POA: Diagnosis not present

## 2018-07-01 DIAGNOSIS — J22 Unspecified acute lower respiratory infection: Secondary | ICD-10-CM | POA: Diagnosis not present

## 2018-07-15 ENCOUNTER — Encounter (HOSPITAL_BASED_OUTPATIENT_CLINIC_OR_DEPARTMENT_OTHER): Payer: Self-pay | Admitting: *Deleted

## 2018-07-15 ENCOUNTER — Other Ambulatory Visit: Payer: Self-pay

## 2018-07-15 DIAGNOSIS — H524 Presbyopia: Secondary | ICD-10-CM | POA: Diagnosis not present

## 2018-07-15 DIAGNOSIS — Z961 Presence of intraocular lens: Secondary | ICD-10-CM | POA: Diagnosis not present

## 2018-07-15 NOTE — Progress Notes (Signed)
Spoke with patient via telephone for pre op interview. NPO after MN. Patient to take Synthroid AM of surgery. Arrival time 0530. Will need BMET and EKG.

## 2018-07-22 NOTE — Anesthesia Preprocedure Evaluation (Addendum)
Anesthesia Evaluation  Patient identified by MRN, date of birth, ID band Patient awake    Reviewed: Allergy & Precautions, NPO status , Patient's Chart, lab work & pertinent test results  History of Anesthesia Complications (+) PONV and history of anesthetic complications  Airway Mallampati: II  TM Distance: <3 FB Neck ROM: Full   Comment: LOOKS ANTERIOR Dental  (+) Teeth Intact, Dental Advisory Given   Pulmonary Current Smoker,    Pulmonary exam normal breath sounds clear to auscultation       Cardiovascular hypertension, Pt. on medications Normal cardiovascular exam Rhythm:Regular Rate:Normal     Neuro/Psych negative neurological ROS  negative psych ROS   GI/Hepatic negative GI ROS, Neg liver ROS,   Endo/Other  Hypothyroidism   Renal/GU negative Renal ROS   BLADDER TUMORS    Musculoskeletal negative musculoskeletal ROS (+)   Abdominal   Peds  Hematology negative hematology ROS (+)   Anesthesia Other Findings Day of surgery medications reviewed with the patient.  Reproductive/Obstetrics                            Anesthesia Physical  Anesthesia Plan  ASA: II  Anesthesia Plan: General   Post-op Pain Management:    Induction: Intravenous  PONV Risk Score and Plan: 4 or greater and Dexamethasone, Ondansetron, Scopolamine patch - Pre-op, Treatment may vary due to age or medical condition and Midazolam  Airway Management Planned: Oral ETT and LMA  Additional Equipment:   Intra-op Plan:   Post-operative Plan: Extubation in OR  Informed Consent: I have reviewed the patients History and Physical, chart, labs and discussed the procedure including the risks, benefits and alternatives for the proposed anesthesia with the patient or authorized representative who has indicated his/her understanding and acceptance.     Dental advisory given  Plan Discussed with: CRNA,  Anesthesiologist and Surgeon  Anesthesia Plan Comments:         Anesthesia Quick Evaluation

## 2018-07-23 ENCOUNTER — Ambulatory Visit (HOSPITAL_COMMUNITY): Payer: PPO | Admitting: Anesthesiology

## 2018-07-23 ENCOUNTER — Ambulatory Visit (HOSPITAL_COMMUNITY)
Admission: RE | Admit: 2018-07-23 | Discharge: 2018-07-23 | Disposition: A | Discharge status: Home / Self Care | Payer: PPO | Attending: Urology | Admitting: Urology

## 2018-07-23 ENCOUNTER — Encounter (HOSPITAL_COMMUNITY)
Admission: RE | Disposition: A | Discharge status: Home / Self Care | Payer: Self-pay | Source: Home / Self Care | Attending: Urology

## 2018-07-23 ENCOUNTER — Other Ambulatory Visit: Payer: Self-pay

## 2018-07-23 ENCOUNTER — Encounter (HOSPITAL_COMMUNITY): Payer: Self-pay | Admitting: *Deleted

## 2018-07-23 DIAGNOSIS — C679 Malignant neoplasm of bladder, unspecified: Secondary | ICD-10-CM | POA: Diagnosis not present

## 2018-07-23 DIAGNOSIS — E785 Hyperlipidemia, unspecified: Secondary | ICD-10-CM | POA: Diagnosis not present

## 2018-07-23 DIAGNOSIS — Z79890 Hormone replacement therapy (postmenopausal): Secondary | ICD-10-CM | POA: Diagnosis not present

## 2018-07-23 DIAGNOSIS — Z79899 Other long term (current) drug therapy: Secondary | ICD-10-CM | POA: Insufficient documentation

## 2018-07-23 DIAGNOSIS — I10 Essential (primary) hypertension: Secondary | ICD-10-CM | POA: Diagnosis not present

## 2018-07-23 DIAGNOSIS — Z7982 Long term (current) use of aspirin: Secondary | ICD-10-CM | POA: Insufficient documentation

## 2018-07-23 DIAGNOSIS — C672 Malignant neoplasm of lateral wall of bladder: Secondary | ICD-10-CM | POA: Diagnosis not present

## 2018-07-23 DIAGNOSIS — E039 Hypothyroidism, unspecified: Secondary | ICD-10-CM | POA: Diagnosis not present

## 2018-07-23 HISTORY — PX: CYSTOSCOPY WITH BIOPSY: SHX5122

## 2018-07-23 LAB — CBC
HCT: 44.8 % (ref 36.0–46.0)
Hemoglobin: 14.4 g/dL (ref 12.0–15.0)
MCH: 31.6 pg (ref 26.0–34.0)
MCHC: 32.1 g/dL (ref 30.0–36.0)
MCV: 98.5 fL (ref 80.0–100.0)
PLATELETS: 354 10*3/uL (ref 150–400)
RBC: 4.55 MIL/uL (ref 3.87–5.11)
RDW: 14.5 % (ref 11.5–15.5)
WBC: 5.8 10*3/uL (ref 4.0–10.5)
nRBC: 0 % (ref 0.0–0.2)

## 2018-07-23 LAB — BASIC METABOLIC PANEL
Anion gap: 13 (ref 5–15)
BUN: 13 mg/dL (ref 8–23)
CO2: 21 mmol/L — AB (ref 22–32)
Calcium: 9.3 mg/dL (ref 8.9–10.3)
Chloride: 102 mmol/L (ref 98–111)
Creatinine, Ser: 0.65 mg/dL (ref 0.44–1.00)
GFR calc Af Amer: >60 mL/min (ref >60)
GFR calc non Af Amer: >60 mL/min (ref >60)
Glucose, Bld: 108 mg/dL — ABNORMAL HIGH (ref 70–99)
Potassium: 3.4 mmol/L — ABNORMAL LOW (ref 3.5–5.1)
Sodium: 136 mmol/L (ref 135–145)

## 2018-07-23 SURGERY — CYSTOSCOPY, WITH BIOPSY
Anesthesia: General | Site: Urethra

## 2018-07-23 MED ORDER — LACTATED RINGERS IV SOLN
INTRAVENOUS | Status: DC
  Administered 2018-07-23: 06:00:00 via INTRAVENOUS

## 2018-07-23 MED ORDER — DEXAMETHASONE SODIUM PHOSPHATE 10 MG/ML IJ SOLN
INTRAMUSCULAR | Status: AC
  Filled 2018-07-23: qty 2

## 2018-07-23 MED ORDER — LIDOCAINE 2% (20 MG/ML) 5 ML SYRINGE
INTRAMUSCULAR | Status: AC
  Filled 2018-07-23: qty 5

## 2018-07-23 MED ORDER — FENTANYL CITRATE (PF) 100 MCG/2ML IJ SOLN
INTRAMUSCULAR | Status: DC | PRN
  Administered 2018-07-23 (×2): 25 ug via INTRAVENOUS
  Administered 2018-07-23: 50 ug via INTRAVENOUS

## 2018-07-23 MED ORDER — LIDOCAINE 2% (20 MG/ML) 5 ML SYRINGE
INTRAMUSCULAR | Status: DC | PRN
  Administered 2018-07-23: 60 mg via INTRAVENOUS

## 2018-07-23 MED ORDER — PROPOFOL 10 MG/ML IV BOLUS
INTRAVENOUS | Status: AC
  Filled 2018-07-23: qty 40

## 2018-07-23 MED ORDER — PHENYLEPHRINE 40 MCG/ML (10ML) SYRINGE FOR IV PUSH (FOR BLOOD PRESSURE SUPPORT)
PREFILLED_SYRINGE | INTRAVENOUS | Status: DC | PRN
  Administered 2018-07-23 (×3): 80 ug via INTRAVENOUS

## 2018-07-23 MED ORDER — FENTANYL CITRATE (PF) 100 MCG/2ML IJ SOLN
INTRAMUSCULAR | Status: AC
  Filled 2018-07-23: qty 2

## 2018-07-23 MED ORDER — ACETAMINOPHEN 325 MG PO TABS: 325.0000 mg | ORAL_TABLET | ORAL | Status: DC | PRN

## 2018-07-23 MED ORDER — OXYCODONE HCL 5 MG/5ML PO SOLN: 5.0000 mg | Freq: Once | ORAL | Status: DC | PRN

## 2018-07-23 MED ORDER — PROPOFOL 10 MG/ML IV BOLUS
INTRAVENOUS | Status: DC | PRN
  Administered 2018-07-23: 150 mg via INTRAVENOUS

## 2018-07-23 MED ORDER — CIPROFLOXACIN IN D5W 400 MG/200ML IV SOLN
400.0000 mg | Freq: Once | INTRAVENOUS | Status: AC
  Administered 2018-07-23: 400 mg via INTRAVENOUS
  Filled 2018-07-23: qty 200

## 2018-07-23 MED ORDER — ONDANSETRON HCL 4 MG/2ML IJ SOLN: 4.0000 mg | Freq: Once | INTRAMUSCULAR | Status: DC | PRN

## 2018-07-23 MED ORDER — ROCURONIUM BROMIDE 10 MG/ML (PF) SYRINGE
PREFILLED_SYRINGE | INTRAVENOUS | Status: AC
  Filled 2018-07-23: qty 10

## 2018-07-23 MED ORDER — SUCCINYLCHOLINE CHLORIDE 200 MG/10ML IV SOSY
PREFILLED_SYRINGE | INTRAVENOUS | Status: AC
  Filled 2018-07-23: qty 10

## 2018-07-23 MED ORDER — ONDANSETRON HCL 4 MG/2ML IJ SOLN
INTRAMUSCULAR | Status: DC | PRN
  Administered 2018-07-23: 4 mg via INTRAVENOUS

## 2018-07-23 MED ORDER — MEPERIDINE HCL 50 MG/ML IJ SOLN
INTRAMUSCULAR | Status: AC
  Filled 2018-07-23: qty 1

## 2018-07-23 MED ORDER — OXYCODONE HCL 5 MG PO TABS: 5.0000 mg | ORAL_TABLET | Freq: Once | ORAL | Status: DC | PRN

## 2018-07-23 MED ORDER — FENTANYL CITRATE (PF) 100 MCG/2ML IJ SOLN
25.0000 ug | INTRAMUSCULAR | Status: DC | PRN
  Administered 2018-07-23: 25 ug via INTRAVENOUS

## 2018-07-23 MED ORDER — ACETAMINOPHEN 160 MG/5ML PO SOLN: 325.0000 mg | ORAL | Status: DC | PRN

## 2018-07-23 MED ORDER — ONDANSETRON HCL 4 MG/2ML IJ SOLN
INTRAMUSCULAR | Status: AC
  Filled 2018-07-23: qty 4

## 2018-07-23 MED ORDER — DEXAMETHASONE SODIUM PHOSPHATE 10 MG/ML IJ SOLN
INTRAMUSCULAR | Status: DC | PRN
  Administered 2018-07-23: 5 mg via INTRAVENOUS

## 2018-07-23 MED ORDER — MEPERIDINE HCL 50 MG/ML IJ SOLN
6.2500 mg | INTRAMUSCULAR | Status: DC | PRN
  Administered 2018-07-23: 6.25 mg via INTRAVENOUS

## 2018-07-23 SURGICAL SUPPLY — 16 items
BAG URINE DRAINAGE (UROLOGICAL SUPPLIES) IMPLANT
BAG URO CATCHER STRL LF (MISCELLANEOUS) ×3 IMPLANT
COVER WAND RF STERILE (DRAPES) IMPLANT
ELECT REM PT RETURN 15FT ADLT (MISCELLANEOUS) ×3 IMPLANT
GLOVE BIOGEL M STRL SZ7.5 (GLOVE) ×3 IMPLANT
GOWN STRL REUS W/TWL LRG LVL3 (GOWN DISPOSABLE) ×3 IMPLANT
GOWN STRL REUS W/TWL XL LVL3 (GOWN DISPOSABLE) ×3 IMPLANT
KIT TURNOVER KIT A (KITS) IMPLANT
LOOP CUT BIPOLAR 24F LRG (ELECTROSURGICAL) IMPLANT
MANIFOLD NEPTUNE II (INSTRUMENTS) ×3 IMPLANT
PACK CYSTO (CUSTOM PROCEDURE TRAY) ×3 IMPLANT
SET ASPIRATION TUBING (TUBING) IMPLANT
SYRINGE IRR TOOMEY STRL 70CC (SYRINGE) IMPLANT
TUBING CONNECTING 10 (TUBING) ×2 IMPLANT
TUBING CONNECTING 10' (TUBING) ×1
TUBING UROLOGY SET (TUBING) ×3 IMPLANT

## 2018-07-23 NOTE — Op Note (Signed)
Operative Note  Preoperative diagnosis:  1.  High-grade T1 urothelial carcinoma the bladder  Postoperative diagnosis: 1.  Same  Procedure(s): 1.  Cystoscopy with bladder biopsy and fulguration  Surgeon: Ellison Hughs, MD  Assistants:  None  Anesthesia:  General  Complications:  None  EBL: Less than 5 mL  Specimens: 1.  Right lateral bladder wall biopsy  Drains/Catheters: 1.  None  Intraoperative findings:   1. No definitive evidence of bladder tumor recurrence.  Biopsies were taken from the previous right lateral wall resection site.  Indication:  DENENE ALAMILLO is a 73 y.o. female with a history of high-grade T1 urothelial carcinoma of the bladder, status post TURBT on 05/31/2018.  She is here today for repeat bladder biopsy.  The risk, benefits and alternatives were discussed preoperatively.  She voices understanding and wishes to proceed.  Description of procedure:  After informed consent was obtained, the patient was brought to the operating room and general LMA anesthesia was administered. The patient was then placed in the dorsolithotomy position and prepped and draped in the usual sterile fashion. A timeout was performed. A 23 French rigid cystoscope was then inserted into the urethral meatus and advanced into the bladder under direct vision. A complete bladder survey revealed no intravesical pathology.  Her previous resection site involving the right lateral wall the bladder demonstrated no evidence of bladder tumor recurrence.  Cold cup biopsies were then taken of the prior resection site and sent for permanent section.  I then extensively fulgurated the area that was biopsied until hemostasis was achieved.  Following fulguration, there was no active bleeding.  The patient's bladder was drained.  She tolerated the procedure well and was transferred to the postanesthesia in stable condition.  Plan: I will arrange a telemedicine visit in 2 weeks to discuss her  pathology results.

## 2018-07-23 NOTE — Transfer of Care (Signed)
Immediate Anesthesia Transfer of Care Note  Patient: Rhonda Cook  Procedure(s) Performed: Procedure(s): CYSTOSCOPY WITH BLADDER BIOPSY (N/A)  Patient Location: PACU  Anesthesia Type:General  Level of Consciousness:  sedated, patient cooperative and responds to stimulation  Airway & Oxygen Therapy:Patient Spontanous Breathing and Patient connected to face mask oxgen  Post-op Assessment:  Report given to PACU RN and Post -op Vital signs reviewed and stable  Post vital signs:  Reviewed and stable  Last Vitals:  Vitals:   07/23/18 0536 07/23/18 0805  BP: 92/74   Pulse: 82   Resp: 16   Temp: 36.5 C (P) 36.4 C  SpO2: 11%     Complications: No apparent anesthesia complications

## 2018-07-23 NOTE — Anesthesia Postprocedure Evaluation (Signed)
Anesthesia Post Note  Patient: Rhonda Cook  Procedure(s) Performed: CYSTOSCOPY WITH BLADDER BIOPSY (N/A Urethra)     Patient location during evaluation: PACU Anesthesia Type: General Level of consciousness: awake and alert Pain management: pain level controlled Vital Signs Assessment: post-procedure vital signs reviewed and stable Respiratory status: spontaneous breathing, nonlabored ventilation, respiratory function stable and patient connected to nasal cannula oxygen Cardiovascular status: blood pressure returned to baseline and stable Postop Assessment: no apparent nausea or vomiting Anesthetic complications: no    Last Vitals:  Vitals:   07/23/18 0845 07/23/18 0852  BP: 124/71 (!) 134/59  Pulse: 63 64  Resp: 18 20  Temp: 36.4 C 36.4 C  SpO2: 98% 98%    Last Pain:  Vitals:   07/23/18 0852  TempSrc:   PainSc: 0-No pain                 Bronwyn Belasco

## 2018-07-23 NOTE — Anesthesia Procedure Notes (Signed)
Procedure Name: LMA Insertion Date/Time: 07/23/2018 7:46 AM Performed by: Lavina Hamman, CRNA Pre-anesthesia Checklist: Patient identified, Emergency Drugs available, Suction available and Patient being monitored Patient Re-evaluated:Patient Re-evaluated prior to induction Oxygen Delivery Method: Circle System Utilized Preoxygenation: Pre-oxygenation with 100% oxygen Induction Type: IV induction Ventilation: Mask ventilation without difficulty LMA: LMA inserted LMA Size: 4.0 Number of attempts: 1 Airway Equipment and Method: Bite block Placement Confirmation: positive ETCO2 Tube secured with: Tape Dental Injury: Teeth and Oropharynx as per pre-operative assessment  Comments: Atraumatic.

## 2018-07-23 NOTE — H&P (Signed)
Urology Preoperative H&P   Chief Complaint: bladder cancer  History of Present Illness: Rhonda Cook is a 73 y.o. female with a history of high grade T1 urothelial carcinoma of the bladder, s/p TURBT on 05/31/18.  She is here today for repeat bladder biopsy to reassess the extend of her cancer.  She denies interval urinary tract infections, fever, cough, dysuria or gross hematuria.  She also denies any significant changes in her overall health in the past 30 days.    Past Medical History:  Diagnosis Date  . Arthritis   . Cancer [facility])    Bladder (2020)  . Diverticulitis   . Hyperlipidemia   . Hypertension   . Hypothyroidism   . PONV (postoperative nausea and vomiting)    slow to awaken, shakes if under too long    Past Surgical History:  Procedure Laterality Date  . CHOLECYSTECTOMY    . COLONOSCOPY N/A 12/15/2015   Procedure: COLONOSCOPY;  Surgeon: Rogene Houston, MD;  Location: [facility];  Service: Endoscopy;  Laterality: N/A;  730 - moved to 8/16 @ 7:30  . FOOT SURGERY Right    2018  . TRANSURETHRAL RESECTION OF BLADDER TUMOR N/A 05/31/2018   Procedure: TRANSURETHRAL RESECTION OF BLADDER TUMOR (TURBT) WITH CYSTOSCOPY, INSTILLATION OF GEMCITABINE IN PACU;  Surgeon: Ceasar Mons, MD;  Location: [facility];  Service: Urology;  Laterality: N/A;    Allergies:  Allergies  Allergen Reactions  . Oxycodone Itching and Nausea And Vomiting  . Adhesive [Tape] Rash  . Penicillins Nausea And Vomiting and Rash    Did it involve swelling of the face/tongue/throat, SOB, or low BP? no Did it involve sudden or severe rash/hives, skin peeling, or any reaction on the inside of your mouth or nose? yes Did you need to seek medical attention at a hospital or doctor's office? no When did it last happen?20 years ago If all above answers are "NO", may proceed with cephalosporin use.    History reviewed. No pertinent family history.  Social History:  reports  that she has been smoking cigarettes. She has been smoking about 0.50 packs per day. She has never used smokeless tobacco. She reports that she does not drink alcohol or use drugs.  ROS: A complete review of systems was performed.  All systems are negative except for pertinent findings as noted.  Physical Exam:  Vital signs in last 24 hours:   Constitutional:  Alert and oriented, No acute distress Cardiovascular: Regular rate and rhythm, No JVD Respiratory: Normal respiratory effort, Lungs clear bilaterally GI: Abdomen is soft, nontender, nondistended, no abdominal masses GU: No CVA tenderness Lymphatic: No lymphadenopathy Neurologic: Grossly intact, no focal deficits Psychiatric: Normal mood and affect  Laboratory Data:  No results for input(s): WBC, HGB, HCT, PLT in the last 72 hours.  No results for input(s): NA, K, CL, GLUCOSE, BUN, CALCIUM, CREATININE in the last 72 hours.  Invalid input(s): CO3   No results found for this or any previous visit (from the past 24 hour(s)). No results found for this or any previous visit (from the past 240 hour(s)).  Renal Function: No results for input(s): CREATININE in the last 168 hours. CrCl cannot be calculated (Patient's most recent lab result is older than the maximum 21 days allowed.).  Radiologic Imaging: No results found.  I independently reviewed the above imaging studies.  Assessment and Plan Rhonda Cook is a 73 y.o. female with high grade T1 UCC of the bladder   -  The risks, benefits and alternative of cystoscopy with bladder biopsy was discussed with the patient.  The risks included, but are not limited to, bleeding, urinary tract infection, bladder perforation requiring prolonged catheterization and/or open bladder repair, ureteral obstruction, voiding dysfunction and the inherent risks of general anesthesia.   Preoperatively, we discussed the risks of contracting the coronavirus and its associated morbidities, including  life threatening pulmonary infection/complications.  The patient voices understanding and wishes to proceed.   Ellison Hughs, MD 07/23/2018, 5:12 AM  [facility] Pager: [phone]

## 2018-07-24 ENCOUNTER — Encounter (HOSPITAL_COMMUNITY): Payer: Self-pay | Admitting: Urology

## 2018-08-05 DIAGNOSIS — G92 Toxic encephalopathy: Secondary | ICD-10-CM | POA: Diagnosis not present

## 2018-08-05 DIAGNOSIS — R55 Syncope and collapse: Secondary | ICD-10-CM | POA: Diagnosis not present

## 2018-08-05 DIAGNOSIS — N179 Acute kidney failure, unspecified: Secondary | ICD-10-CM | POA: Diagnosis not present

## 2018-08-05 DIAGNOSIS — Z681 Body mass index (BMI) 19 or less, adult: Secondary | ICD-10-CM | POA: Diagnosis not present

## 2018-08-05 DIAGNOSIS — Z1389 Encounter for screening for other disorder: Secondary | ICD-10-CM | POA: Diagnosis not present

## 2018-08-05 DIAGNOSIS — Z9484 Stem cells transplant status: Secondary | ICD-10-CM | POA: Diagnosis not present

## 2018-08-05 DIAGNOSIS — I499 Cardiac arrhythmia, unspecified: Secondary | ICD-10-CM | POA: Diagnosis not present

## 2018-08-05 DIAGNOSIS — J811 Chronic pulmonary edema: Secondary | ICD-10-CM | POA: Diagnosis not present

## 2018-08-05 DIAGNOSIS — J189 Pneumonia, unspecified organism: Secondary | ICD-10-CM | POA: Diagnosis not present

## 2018-08-05 DIAGNOSIS — Z0001 Encounter for general adult medical examination with abnormal findings: Secondary | ICD-10-CM | POA: Diagnosis not present

## 2018-08-05 DIAGNOSIS — E1169 Type 2 diabetes mellitus with other specified complication: Secondary | ICD-10-CM | POA: Diagnosis not present

## 2018-08-05 DIAGNOSIS — J9601 Acute respiratory failure with hypoxia: Secondary | ICD-10-CM | POA: Diagnosis not present

## 2018-08-05 DIAGNOSIS — E785 Hyperlipidemia, unspecified: Secondary | ICD-10-CM | POA: Diagnosis not present

## 2018-08-05 DIAGNOSIS — W19XXXA Unspecified fall, initial encounter: Secondary | ICD-10-CM | POA: Diagnosis not present

## 2018-08-05 DIAGNOSIS — R61 Generalized hyperhidrosis: Secondary | ICD-10-CM | POA: Diagnosis not present

## 2018-08-05 DIAGNOSIS — E063 Autoimmune thyroiditis: Secondary | ICD-10-CM | POA: Diagnosis not present

## 2018-08-05 DIAGNOSIS — I10 Essential (primary) hypertension: Secondary | ICD-10-CM | POA: Diagnosis not present

## 2018-08-05 DIAGNOSIS — E7849 Other hyperlipidemia: Secondary | ICD-10-CM | POA: Diagnosis not present

## 2018-08-05 DIAGNOSIS — E8770 Fluid overload, unspecified: Secondary | ICD-10-CM | POA: Diagnosis not present

## 2018-08-05 DIAGNOSIS — D7581 Myelofibrosis: Secondary | ICD-10-CM | POA: Diagnosis not present

## 2018-09-20 DIAGNOSIS — Z5111 Encounter for antineoplastic chemotherapy: Secondary | ICD-10-CM | POA: Diagnosis not present

## 2018-09-20 DIAGNOSIS — C672 Malignant neoplasm of lateral wall of bladder: Secondary | ICD-10-CM | POA: Diagnosis not present

## 2018-09-27 DIAGNOSIS — Z5111 Encounter for antineoplastic chemotherapy: Secondary | ICD-10-CM | POA: Diagnosis not present

## 2018-09-27 DIAGNOSIS — C672 Malignant neoplasm of lateral wall of bladder: Secondary | ICD-10-CM | POA: Diagnosis not present

## 2018-10-04 DIAGNOSIS — C672 Malignant neoplasm of lateral wall of bladder: Secondary | ICD-10-CM | POA: Diagnosis not present

## 2018-10-11 ENCOUNTER — Encounter (HOSPITAL_COMMUNITY)
Admission: RE | Admit: 2018-10-11 | Discharge: 2018-10-11 | Disposition: A | Discharge status: Home / Self Care | Payer: PPO | Source: Ambulatory Visit | Attending: Physician Assistant | Admitting: Physician Assistant

## 2018-10-11 DIAGNOSIS — M810 Age-related osteoporosis without current pathological fracture: Secondary | ICD-10-CM | POA: Insufficient documentation

## 2018-10-11 DIAGNOSIS — C672 Malignant neoplasm of lateral wall of bladder: Secondary | ICD-10-CM | POA: Diagnosis not present

## 2018-10-11 DIAGNOSIS — Z5111 Encounter for antineoplastic chemotherapy: Secondary | ICD-10-CM | POA: Diagnosis not present

## 2018-10-11 MED ORDER — DENOSUMAB 60 MG/ML ~~LOC~~ SOSY
60.0000 mg | PREFILLED_SYRINGE | Freq: Once | SUBCUTANEOUS | Status: AC
  Administered 2018-10-11: 60 mg via SUBCUTANEOUS

## 2018-10-18 DIAGNOSIS — Z5111 Encounter for antineoplastic chemotherapy: Secondary | ICD-10-CM | POA: Diagnosis not present

## 2018-10-18 DIAGNOSIS — C672 Malignant neoplasm of lateral wall of bladder: Secondary | ICD-10-CM | POA: Diagnosis not present

## 2018-10-25 DIAGNOSIS — C672 Malignant neoplasm of lateral wall of bladder: Secondary | ICD-10-CM | POA: Diagnosis not present

## 2018-10-25 DIAGNOSIS — Z5111 Encounter for antineoplastic chemotherapy: Secondary | ICD-10-CM | POA: Diagnosis not present

## 2018-11-12 DIAGNOSIS — Z1389 Encounter for screening for other disorder: Secondary | ICD-10-CM | POA: Diagnosis not present

## 2018-11-12 DIAGNOSIS — Z6821 Body mass index (BMI) 21.0-21.9, adult: Secondary | ICD-10-CM | POA: Diagnosis not present

## 2018-11-12 DIAGNOSIS — M545 Low back pain: Secondary | ICD-10-CM | POA: Diagnosis not present

## 2018-11-12 DIAGNOSIS — M5410 Radiculopathy, site unspecified: Secondary | ICD-10-CM | POA: Diagnosis not present

## 2018-11-12 DIAGNOSIS — M5136 Other intervertebral disc degeneration, lumbar region: Secondary | ICD-10-CM | POA: Diagnosis not present

## 2018-11-15 DIAGNOSIS — C672 Malignant neoplasm of lateral wall of bladder: Secondary | ICD-10-CM | POA: Diagnosis not present

## 2018-12-03 DIAGNOSIS — E063 Autoimmune thyroiditis: Secondary | ICD-10-CM | POA: Diagnosis not present

## 2018-12-03 DIAGNOSIS — C679 Malignant neoplasm of bladder, unspecified: Secondary | ICD-10-CM | POA: Diagnosis not present

## 2018-12-03 DIAGNOSIS — Z6821 Body mass index (BMI) 21.0-21.9, adult: Secondary | ICD-10-CM | POA: Diagnosis not present

## 2018-12-03 DIAGNOSIS — I10 Essential (primary) hypertension: Secondary | ICD-10-CM | POA: Diagnosis not present

## 2018-12-03 DIAGNOSIS — Z23 Encounter for immunization: Secondary | ICD-10-CM | POA: Diagnosis not present

## 2018-12-03 DIAGNOSIS — E7849 Other hyperlipidemia: Secondary | ICD-10-CM | POA: Diagnosis not present

## 2018-12-09 ENCOUNTER — Encounter (INDEPENDENT_AMBULATORY_CARE_PROVIDER_SITE_OTHER): Payer: Self-pay | Admitting: *Deleted

## 2018-12-10 DIAGNOSIS — N390 Urinary tract infection, site not specified: Secondary | ICD-10-CM | POA: Diagnosis not present

## 2018-12-10 DIAGNOSIS — Z6821 Body mass index (BMI) 21.0-21.9, adult: Secondary | ICD-10-CM | POA: Diagnosis not present

## 2018-12-10 DIAGNOSIS — N342 Other urethritis: Secondary | ICD-10-CM | POA: Diagnosis not present

## 2019-02-06 DIAGNOSIS — L820 Inflamed seborrheic keratosis: Secondary | ICD-10-CM | POA: Diagnosis not present

## 2019-02-06 DIAGNOSIS — L821 Other seborrheic keratosis: Secondary | ICD-10-CM | POA: Diagnosis not present

## 2019-02-06 DIAGNOSIS — D225 Melanocytic nevi of trunk: Secondary | ICD-10-CM | POA: Diagnosis not present

## 2019-02-06 DIAGNOSIS — B078 Other viral warts: Secondary | ICD-10-CM | POA: Diagnosis not present

## 2019-02-27 DIAGNOSIS — D414 Neoplasm of uncertain behavior of bladder: Secondary | ICD-10-CM | POA: Diagnosis not present

## 2019-03-06 DIAGNOSIS — L820 Inflamed seborrheic keratosis: Secondary | ICD-10-CM | POA: Diagnosis not present

## 2019-03-07 DIAGNOSIS — C672 Malignant neoplasm of lateral wall of bladder: Secondary | ICD-10-CM | POA: Diagnosis not present

## 2019-04-11 ENCOUNTER — Other Ambulatory Visit: Payer: Self-pay

## 2019-04-11 ENCOUNTER — Encounter (HOSPITAL_COMMUNITY): Admission: RE | Admit: 2019-04-11 | Payer: PPO | Source: Ambulatory Visit

## 2019-04-11 ENCOUNTER — Encounter (HOSPITAL_COMMUNITY): Payer: Self-pay

## 2019-04-11 ENCOUNTER — Encounter (HOSPITAL_COMMUNITY)
Admission: RE | Admit: 2019-04-11 | Discharge: 2019-04-11 | Disposition: A | Discharge status: Home / Self Care | Payer: PPO | Source: Ambulatory Visit | Attending: Physician Assistant | Admitting: Physician Assistant

## 2019-04-11 DIAGNOSIS — M810 Age-related osteoporosis without current pathological fracture: Secondary | ICD-10-CM | POA: Diagnosis not present

## 2019-04-11 MED ORDER — DENOSUMAB 60 MG/ML ~~LOC~~ SOSY
60.0000 mg | PREFILLED_SYRINGE | Freq: Once | SUBCUTANEOUS | Status: AC
  Administered 2019-04-11: 60 mg via SUBCUTANEOUS

## 2019-05-10 IMAGING — CT CT RENAL STONE PROTOCOL
2 of 4 series · 16 of 46 positions shown, 18 images · non-contrast
Comparison: 11/15/2017

CLINICAL DATA: Patient on antibiotics for UTI passing blood clots.

EXAM:
CT ABDOMEN AND PELVIS WITHOUT CONTRAST
TECHNIQUE: Multidetector CT imaging of the abdomen and pelvis was performed
following the standard protocol without IV contrast.

[Series 2: axial st · axial · 0.66mm/px · z∈[+1066,+1446]mm · 13 of 87 slices shown, 15 images]
[im 7/87  soft-tissue]
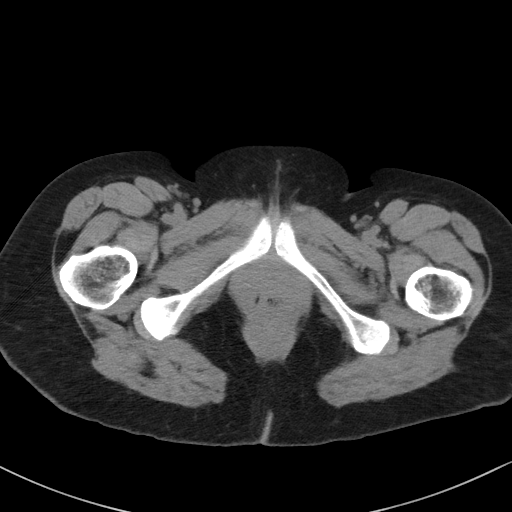
[im 7/87  bone]
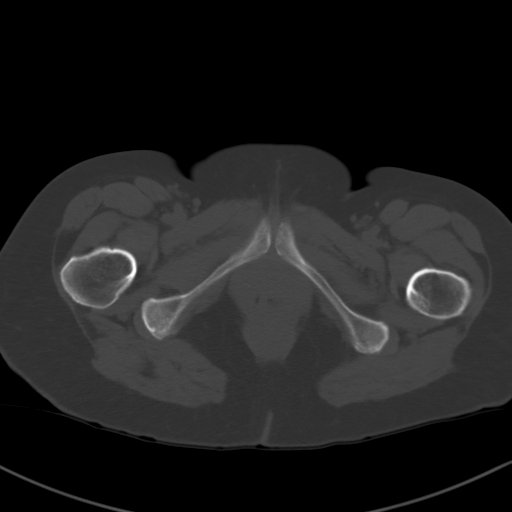
[im 13/87  soft-tissue]
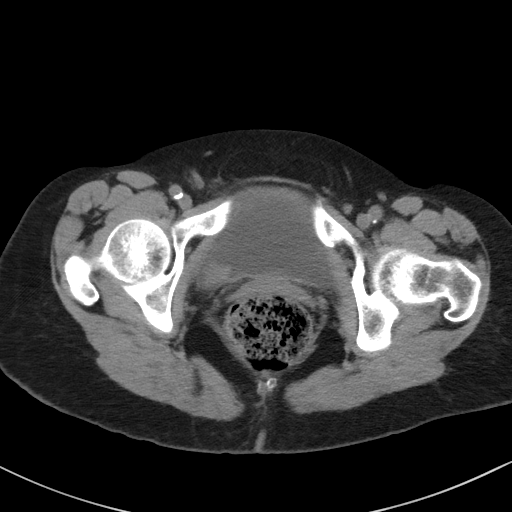
[im 20/87  soft-tissue]
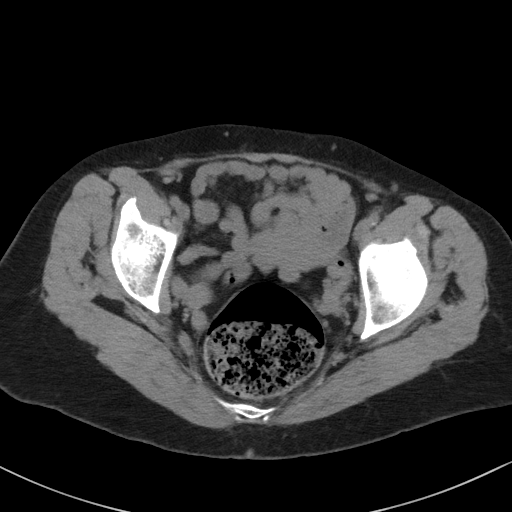
[im 26/87  soft-tissue]
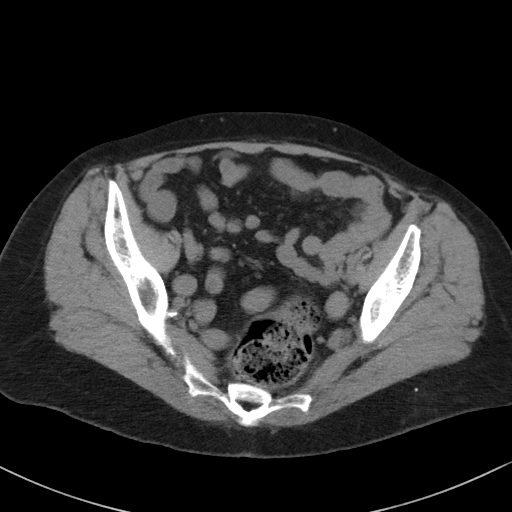
[im 32/87  soft-tissue]
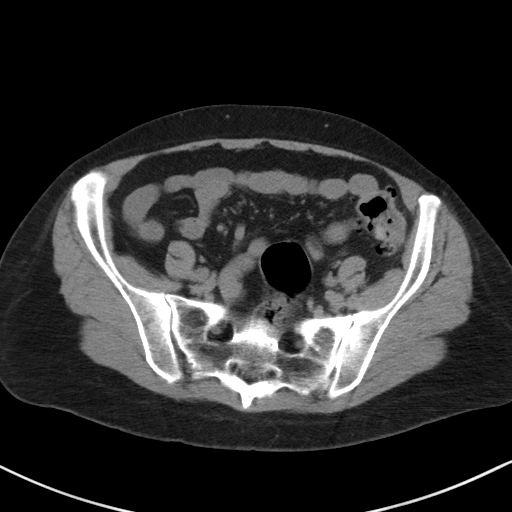
[im 39/87  soft-tissue]
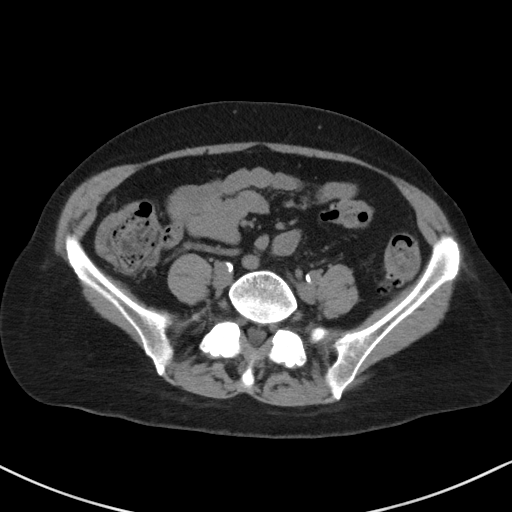
[im 45/87  soft-tissue]
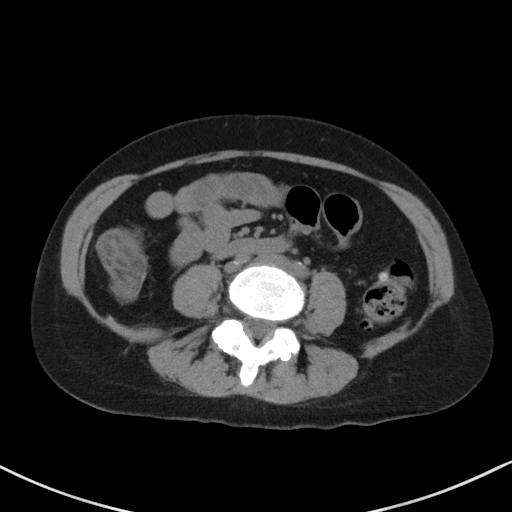
[im 51/87  soft-tissue]
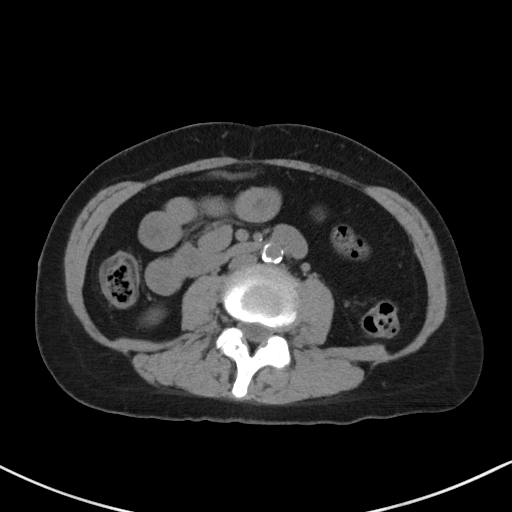
[im 58/87  soft-tissue]
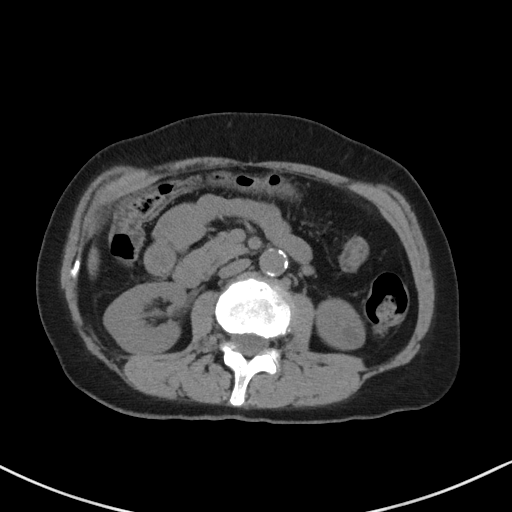
[im 58/87  bone]
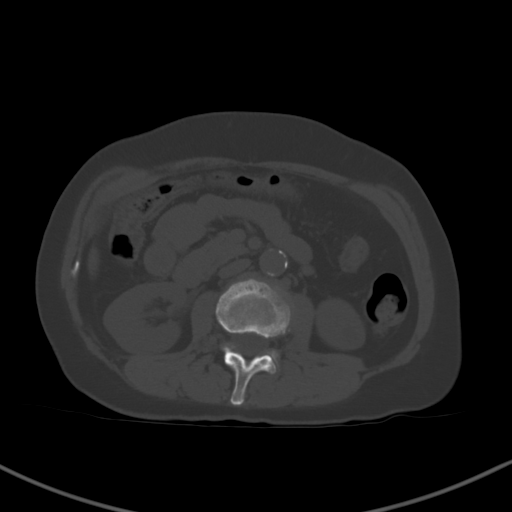
[im 64/87  soft-tissue]
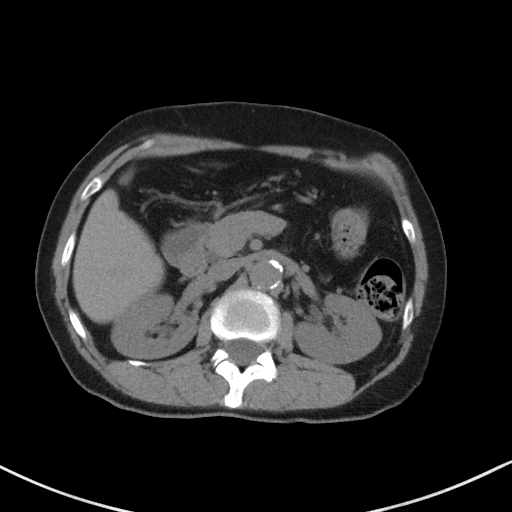
[im 71/87  soft-tissue]
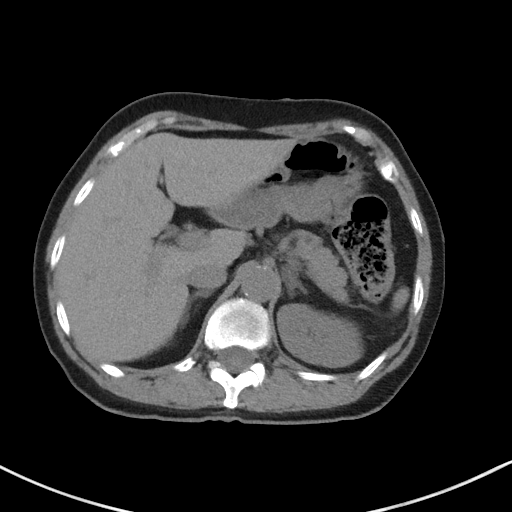
[im 77/87  soft-tissue]
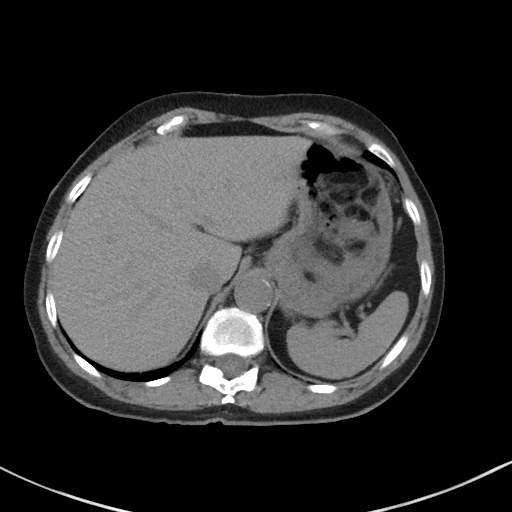
[im 83/87  soft-tissue]
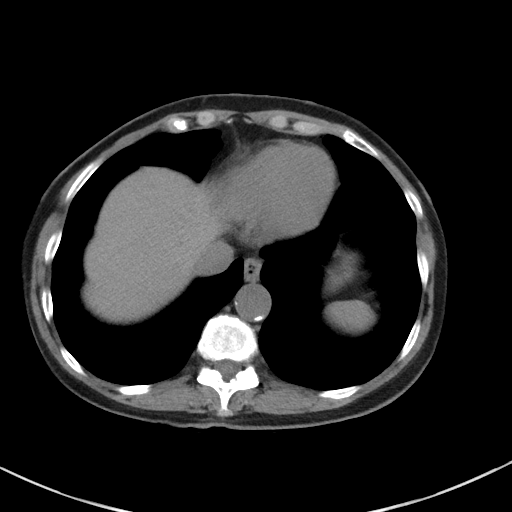

[Series 5: coronal st · coronal · 0.67mm/px · 3 of 72 slices shown]
[im 24/72  soft-tissue]
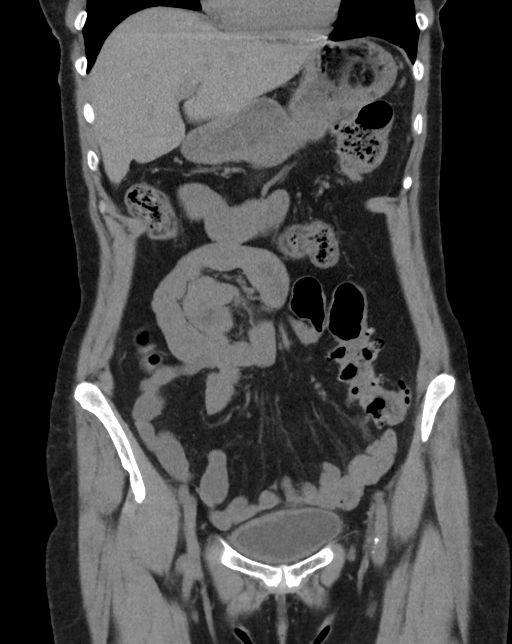
[im 32/72  soft-tissue]
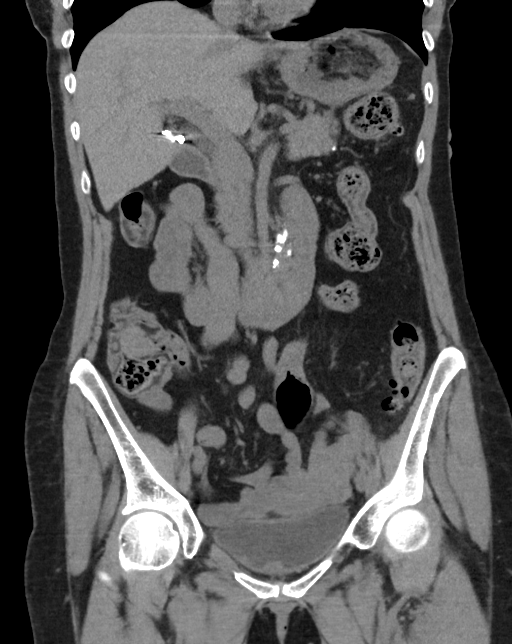
[im 40/72  soft-tissue]
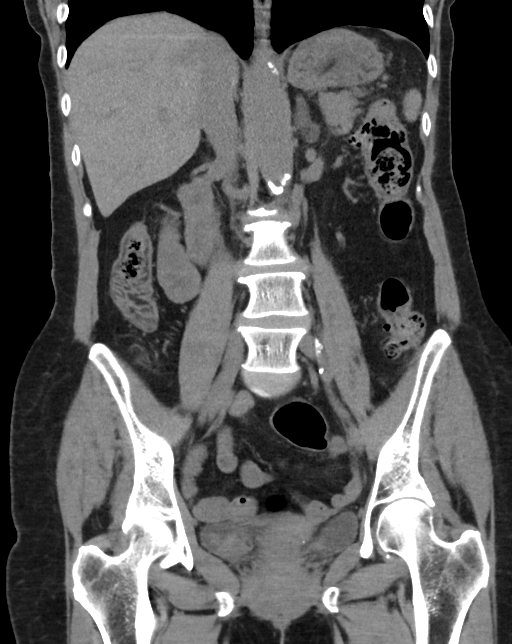

[16 of 46 positions shown; findings below may reference images not displayed]

FINDINGS: Lower chest: Lung bases are within normal.

Hepatobiliary: Previous cholecystectomy. Liver and biliary tree are
normal.

Pancreas: Normal.

Spleen: Normal.

Adrenals/Urinary Tract: 2 small left adrenal adenomas unchanged.
Right adrenal gland unchanged. Kidneys are normal in size without
hydronephrosis or nephrolithiasis. Stable 1.6 cm hypodensity over
the lower pole left kidney with subtle associated calcification
likely minimally complicated cyst. Ureters are normal. Patchy
dependent intermediate density material over the bladder likely
hemorrhage/clots as bladder wall mass/polyp is also possible.

Stomach/Bowel: Stomach and small bowel are normal. Appendix is not
well visualized. Diverticulosis of the colon mild to moderate fecal
retention over the rectosigmoid colon

Vascular/Lymphatic: Mild-to-moderate calcified plaque over the
abdominal aorta no adenopathy.

Reproductive: Unremarkable.

Other: No free fluid or focal inflammatory change.

Musculoskeletal: Mild degenerative change of the spine.
IMPRESSION: Mild patchy intermediate density over the dependent portion of the
bladder likely hemorrhage/clots, although bladder mass/polyp is
possible.

Stable 1.6 cm hypodensity with subtle calcification over the lower
pole left kidney likely complicated cyst.

Small stable left adrenal adenomas.

Diverticulosis of the colon. Mild to moderate fecal retention over
the rectosigmoid colon.

Aortic Atherosclerosis ([id number]).

## 2019-06-01 DIAGNOSIS — I10 Essential (primary) hypertension: Secondary | ICD-10-CM | POA: Diagnosis not present

## 2019-06-01 DIAGNOSIS — E063 Autoimmune thyroiditis: Secondary | ICD-10-CM | POA: Diagnosis not present

## 2019-06-01 DIAGNOSIS — E7849 Other hyperlipidemia: Secondary | ICD-10-CM | POA: Diagnosis not present

## 2019-06-05 DIAGNOSIS — C672 Malignant neoplasm of lateral wall of bladder: Secondary | ICD-10-CM | POA: Diagnosis not present

## 2019-06-29 DIAGNOSIS — I10 Essential (primary) hypertension: Secondary | ICD-10-CM | POA: Diagnosis not present

## 2019-06-29 DIAGNOSIS — E7849 Other hyperlipidemia: Secondary | ICD-10-CM | POA: Diagnosis not present

## 2019-06-29 DIAGNOSIS — E063 Autoimmune thyroiditis: Secondary | ICD-10-CM | POA: Diagnosis not present

## 2019-07-21 DIAGNOSIS — H524 Presbyopia: Secondary | ICD-10-CM | POA: Diagnosis not present

## 2019-07-21 DIAGNOSIS — H52203 Unspecified astigmatism, bilateral: Secondary | ICD-10-CM | POA: Diagnosis not present

## 2019-07-21 DIAGNOSIS — H5213 Myopia, bilateral: Secondary | ICD-10-CM | POA: Diagnosis not present

## 2019-07-21 DIAGNOSIS — Z961 Presence of intraocular lens: Secondary | ICD-10-CM | POA: Diagnosis not present

## 2019-07-30 DIAGNOSIS — E7849 Other hyperlipidemia: Secondary | ICD-10-CM | POA: Diagnosis not present

## 2019-07-30 DIAGNOSIS — E063 Autoimmune thyroiditis: Secondary | ICD-10-CM | POA: Diagnosis not present

## 2019-07-30 DIAGNOSIS — I10 Essential (primary) hypertension: Secondary | ICD-10-CM | POA: Diagnosis not present

## 2019-08-12 DIAGNOSIS — Z1389 Encounter for screening for other disorder: Secondary | ICD-10-CM | POA: Diagnosis not present

## 2019-08-12 DIAGNOSIS — Z719 Counseling, unspecified: Secondary | ICD-10-CM | POA: Diagnosis not present

## 2019-08-12 DIAGNOSIS — C679 Malignant neoplasm of bladder, unspecified: Secondary | ICD-10-CM | POA: Diagnosis not present

## 2019-08-12 DIAGNOSIS — Z0001 Encounter for general adult medical examination with abnormal findings: Secondary | ICD-10-CM | POA: Diagnosis not present

## 2019-08-12 DIAGNOSIS — Z6821 Body mass index (BMI) 21.0-21.9, adult: Secondary | ICD-10-CM | POA: Diagnosis not present

## 2019-08-12 DIAGNOSIS — D1802 Hemangioma of intracranial structures: Secondary | ICD-10-CM | POA: Diagnosis not present

## 2019-08-12 DIAGNOSIS — E7849 Other hyperlipidemia: Secondary | ICD-10-CM | POA: Diagnosis not present

## 2019-08-12 DIAGNOSIS — I10 Essential (primary) hypertension: Secondary | ICD-10-CM | POA: Diagnosis not present

## 2019-08-29 DIAGNOSIS — E7849 Other hyperlipidemia: Secondary | ICD-10-CM | POA: Diagnosis not present

## 2019-08-29 DIAGNOSIS — I10 Essential (primary) hypertension: Secondary | ICD-10-CM | POA: Diagnosis not present

## 2019-08-29 DIAGNOSIS — E063 Autoimmune thyroiditis: Secondary | ICD-10-CM | POA: Diagnosis not present

## 2019-09-08 DIAGNOSIS — R8271 Bacteriuria: Secondary | ICD-10-CM | POA: Diagnosis not present

## 2019-09-08 DIAGNOSIS — C672 Malignant neoplasm of lateral wall of bladder: Secondary | ICD-10-CM | POA: Diagnosis not present

## 2019-09-11 ENCOUNTER — Other Ambulatory Visit: Payer: Self-pay

## 2019-09-11 ENCOUNTER — Other Ambulatory Visit: Payer: Self-pay | Admitting: Urology

## 2019-09-11 ENCOUNTER — Encounter (HOSPITAL_BASED_OUTPATIENT_CLINIC_OR_DEPARTMENT_OTHER): Payer: Self-pay | Admitting: Urology

## 2019-09-11 NOTE — Progress Notes (Addendum)
ADDENDUM:  Chart reviewed by anesthesia, Konrad Felix PA, ok to proceed.  Spoke w/ via phone for pre-op interview--- PT Lab needs dos----  [place] and ekg             Lab results------ no COVID test ------ 09-13-2019 @ 1045 Arrive at ------- 0930 NPO after ------ [place] Medications to take morning of surgery ----- Synthroid w/ sips of water Diabetic medication ----- n/a Patient Special Instructions ----- asked to bring rescue inhaler dos Pre-Op special Istructions ----- n/a Patient verbalized understanding of instructions that were given at this phone interview. Patient denies shortness of breath, chest pain, fever, cough a this phone interview.   Anesthesia review:  Hx HTN, COPD.  Pt stated has never used oxygen;  Last used rescue inhaler 2 months ago, last used nebulizer 1 month ago and no URI in past month. Pt denies sob or difficulty breathing.  Chart to be reviewed by Konrad Felix PA.  PCP: Dr Gilford Rile (lov 02/ 2021 per pt) Cardiologist : no Chest x-ray : 03-09-2017 epic EKG : 07-23-2018 epic Echo : no Stress test:  Pt stated never had a stress test Cardiac Cath :  no Sleep Study/ CPAP : NO Fasting Blood Sugar :      / Checks Blood Sugar -- times a day:   N/A Blood Thinner/ Instructions /Last Dose: NO ASA / Instructions/ Last Dose :  ASA 81mg /  Pt stated given instructions from Dr Lovena Neighbours office to stop today/  Last dose this morning (09-11-2019)

## 2019-09-13 ENCOUNTER — Other Ambulatory Visit (HOSPITAL_COMMUNITY)
Admission: RE | Admit: 2019-09-13 | Discharge: 2019-09-13 | Disposition: A | Discharge status: Home / Self Care | Payer: PPO | Source: Ambulatory Visit | Attending: Urology | Admitting: Urology

## 2019-09-13 DIAGNOSIS — Z01812 Encounter for preprocedural laboratory examination: Secondary | ICD-10-CM | POA: Diagnosis not present

## 2019-09-13 DIAGNOSIS — Z20822 Contact with and (suspected) exposure to covid-19: Secondary | ICD-10-CM | POA: Insufficient documentation

## 2019-09-13 LAB — SARS CORONAVIRUS 2 (TAT 6-24 HRS): SARS Coronavirus 2: NEGATIVE

## 2019-09-16 ENCOUNTER — Ambulatory Visit (HOSPITAL_BASED_OUTPATIENT_CLINIC_OR_DEPARTMENT_OTHER)
Admission: RE | Admit: 2019-09-16 | Discharge: 2019-09-16 | Disposition: A | Discharge status: Home / Self Care | Payer: PPO | Attending: Urology | Admitting: Urology

## 2019-09-16 ENCOUNTER — Encounter (HOSPITAL_BASED_OUTPATIENT_CLINIC_OR_DEPARTMENT_OTHER): Payer: Self-pay | Admitting: Urology

## 2019-09-16 ENCOUNTER — Other Ambulatory Visit: Payer: Self-pay

## 2019-09-16 ENCOUNTER — Encounter (HOSPITAL_BASED_OUTPATIENT_CLINIC_OR_DEPARTMENT_OTHER)
Admission: RE | Disposition: A | Discharge status: Home / Self Care | Payer: Self-pay | Source: Home / Self Care | Attending: Urology

## 2019-09-16 ENCOUNTER — Ambulatory Visit (HOSPITAL_BASED_OUTPATIENT_CLINIC_OR_DEPARTMENT_OTHER): Payer: PPO | Admitting: Physician Assistant

## 2019-09-16 DIAGNOSIS — D090 Carcinoma in situ of bladder: Secondary | ICD-10-CM | POA: Diagnosis not present

## 2019-09-16 DIAGNOSIS — E7800 Pure hypercholesterolemia, unspecified: Secondary | ICD-10-CM | POA: Diagnosis not present

## 2019-09-16 DIAGNOSIS — J449 Chronic obstructive pulmonary disease, unspecified: Secondary | ICD-10-CM | POA: Diagnosis not present

## 2019-09-16 DIAGNOSIS — E785 Hyperlipidemia, unspecified: Secondary | ICD-10-CM | POA: Diagnosis not present

## 2019-09-16 DIAGNOSIS — C679 Malignant neoplasm of bladder, unspecified: Secondary | ICD-10-CM | POA: Diagnosis not present

## 2019-09-16 DIAGNOSIS — Z7952 Long term (current) use of systemic steroids: Secondary | ICD-10-CM | POA: Diagnosis not present

## 2019-09-16 DIAGNOSIS — Z809 Family history of malignant neoplasm, unspecified: Secondary | ICD-10-CM | POA: Diagnosis not present

## 2019-09-16 DIAGNOSIS — C674 Malignant neoplasm of posterior wall of bladder: Secondary | ICD-10-CM | POA: Insufficient documentation

## 2019-09-16 DIAGNOSIS — Z9221 Personal history of antineoplastic chemotherapy: Secondary | ICD-10-CM | POA: Diagnosis not present

## 2019-09-16 DIAGNOSIS — Z8261 Family history of arthritis: Secondary | ICD-10-CM | POA: Diagnosis not present

## 2019-09-16 DIAGNOSIS — D500 Iron deficiency anemia secondary to blood loss (chronic): Secondary | ICD-10-CM | POA: Diagnosis not present

## 2019-09-16 DIAGNOSIS — F17200 Nicotine dependence, unspecified, uncomplicated: Secondary | ICD-10-CM | POA: Diagnosis not present

## 2019-09-16 DIAGNOSIS — E039 Hypothyroidism, unspecified: Secondary | ICD-10-CM | POA: Insufficient documentation

## 2019-09-16 DIAGNOSIS — Z800 Family history of malignant neoplasm of digestive organs: Secondary | ICD-10-CM | POA: Diagnosis not present

## 2019-09-16 DIAGNOSIS — Z8051 Family history of malignant neoplasm of kidney: Secondary | ICD-10-CM | POA: Insufficient documentation

## 2019-09-16 DIAGNOSIS — Z79899 Other long term (current) drug therapy: Secondary | ICD-10-CM | POA: Diagnosis not present

## 2019-09-16 DIAGNOSIS — Z885 Allergy status to narcotic agent status: Secondary | ICD-10-CM | POA: Diagnosis not present

## 2019-09-16 DIAGNOSIS — M1990 Unspecified osteoarthritis, unspecified site: Secondary | ICD-10-CM | POA: Diagnosis not present

## 2019-09-16 DIAGNOSIS — E876 Hypokalemia: Secondary | ICD-10-CM | POA: Diagnosis not present

## 2019-09-16 DIAGNOSIS — D494 Neoplasm of unspecified behavior of bladder: Secondary | ICD-10-CM | POA: Diagnosis not present

## 2019-09-16 DIAGNOSIS — C671 Malignant neoplasm of dome of bladder: Secondary | ICD-10-CM | POA: Diagnosis not present

## 2019-09-16 DIAGNOSIS — Z881 Allergy status to other antibiotic agents status: Secondary | ICD-10-CM | POA: Insufficient documentation

## 2019-09-16 DIAGNOSIS — I10 Essential (primary) hypertension: Secondary | ICD-10-CM | POA: Diagnosis not present

## 2019-09-16 DIAGNOSIS — N920 Excessive and frequent menstruation with regular cycle: Secondary | ICD-10-CM | POA: Diagnosis not present

## 2019-09-16 HISTORY — DX: Other constipation: K59.09

## 2019-09-16 HISTORY — DX: Malignant neoplasm of bladder, unspecified: C67.9

## 2019-09-16 HISTORY — DX: Chronic obstructive pulmonary disease, unspecified: J44.9

## 2019-09-16 HISTORY — PX: CYSTOSCOPY WITH BIOPSY: SHX5122

## 2019-09-16 HISTORY — DX: Nocturia: R35.1

## 2019-09-16 HISTORY — DX: Age-related osteoporosis without current pathological fracture: M81.0

## 2019-09-16 HISTORY — DX: Diverticulosis of large intestine without perforation or abscess without bleeding: K57.30

## 2019-09-16 HISTORY — DX: Personal history of urinary (tract) infections: Z87.440

## 2019-09-16 HISTORY — DX: Personal history of colonic polyps: Z86.010

## 2019-09-16 HISTORY — DX: Autoimmune thyroiditis: E06.3

## 2019-09-16 HISTORY — DX: Dry eye syndrome of unspecified lacrimal gland: H04.129

## 2019-09-16 HISTORY — DX: Personal history of adenomatous and serrated colon polyps: Z86.0101

## 2019-09-16 LAB — POCT I-STAT, CHEM 8
BUN: 9 mg/dL (ref 8–23)
Calcium, Ion: 1.23 mmol/L (ref 1.15–1.40)
Chloride: 106 mmol/L (ref 98–111)
Creatinine, Ser: 0.5 mg/dL (ref 0.44–1.00)
Glucose, Bld: 97 mg/dL (ref 70–99)
HCT: 39 % (ref 36.0–46.0)
Hemoglobin: 13.3 g/dL (ref 12.0–15.0)
Potassium: 3.7 mmol/L (ref 3.5–5.1)
Sodium: 139 mmol/L (ref 135–145)
TCO2: 26 mmol/L (ref 22–32)

## 2019-09-16 SURGERY — CYSTOSCOPY, WITH BIOPSY
Anesthesia: General | Site: Bladder

## 2019-09-16 MED ORDER — GEMCITABINE CHEMO FOR BLADDER INSTILLATION 2000 MG
2000.0000 mg | Freq: Once | INTRAVENOUS | Status: AC
  Administered 2019-09-16: 2000 mg via INTRAVESICAL
  Filled 2019-09-16: qty 2000

## 2019-09-16 MED ORDER — LIDOCAINE 2% (20 MG/ML) 5 ML SYRINGE
INTRAMUSCULAR | Status: AC
  Filled 2019-09-16: qty 5

## 2019-09-16 MED ORDER — FENTANYL CITRATE (PF) 100 MCG/2ML IJ SOLN
INTRAMUSCULAR | Status: AC
  Filled 2019-09-16: qty 2

## 2019-09-16 MED ORDER — MIDAZOLAM HCL 2 MG/2ML IJ SOLN
INTRAMUSCULAR | Status: AC
  Filled 2019-09-16: qty 2

## 2019-09-16 MED ORDER — CIPROFLOXACIN IN D5W 400 MG/200ML IV SOLN
INTRAVENOUS | Status: AC
  Filled 2019-09-16: qty 200

## 2019-09-16 MED ORDER — FENTANYL CITRATE (PF) 100 MCG/2ML IJ SOLN
25.0000 ug | INTRAMUSCULAR | Status: DC | PRN
  Administered 2019-09-16: 50 ug via INTRAVENOUS

## 2019-09-16 MED ORDER — CIPROFLOXACIN IN D5W 400 MG/200ML IV SOLN
400.0000 mg | Freq: Once | INTRAVENOUS | Status: AC
  Administered 2019-09-16: 400 mg via INTRAVENOUS

## 2019-09-16 MED ORDER — OXYCODONE HCL 5 MG PO TABS: 5.0000 mg | ORAL_TABLET | Freq: Once | ORAL | Status: DC | PRN

## 2019-09-16 MED ORDER — FENTANYL CITRATE (PF) 100 MCG/2ML IJ SOLN
INTRAMUSCULAR | Status: DC | PRN
  Administered 2019-09-16 (×2): 25 ug via INTRAVENOUS
  Administered 2019-09-16: 50 ug via INTRAVENOUS

## 2019-09-16 MED ORDER — ONDANSETRON HCL 4 MG/2ML IJ SOLN: 4.0000 mg | Freq: Once | INTRAMUSCULAR | Status: DC | PRN

## 2019-09-16 MED ORDER — MIDAZOLAM HCL 5 MG/5ML IJ SOLN
INTRAMUSCULAR | Status: DC | PRN
  Administered 2019-09-16: 1 mg via INTRAVENOUS

## 2019-09-16 MED ORDER — STERILE WATER FOR IRRIGATION IR SOLN
Status: DC | PRN
  Administered 2019-09-16: 3000 mL via INTRAVESICAL

## 2019-09-16 MED ORDER — LIDOCAINE 2% (20 MG/ML) 5 ML SYRINGE
INTRAMUSCULAR | Status: DC | PRN
  Administered 2019-09-16: 60 mg via INTRAVENOUS

## 2019-09-16 MED ORDER — ONDANSETRON HCL 4 MG/2ML IJ SOLN
INTRAMUSCULAR | Status: AC
  Filled 2019-09-16: qty 2

## 2019-09-16 MED ORDER — PROPOFOL 10 MG/ML IV BOLUS
INTRAVENOUS | Status: DC | PRN
  Administered 2019-09-16: 120 mg via INTRAVENOUS

## 2019-09-16 MED ORDER — OXYCODONE HCL 5 MG/5ML PO SOLN: 5.0000 mg | Freq: Once | ORAL | Status: DC | PRN

## 2019-09-16 MED ORDER — DEXAMETHASONE SODIUM PHOSPHATE 10 MG/ML IJ SOLN
INTRAMUSCULAR | Status: DC | PRN
  Administered 2019-09-16: 5 mg via INTRAVENOUS

## 2019-09-16 MED ORDER — ONDANSETRON HCL 4 MG/2ML IJ SOLN
INTRAMUSCULAR | Status: DC | PRN
  Administered 2019-09-16: 4 mg via INTRAVENOUS

## 2019-09-16 MED ORDER — DEXAMETHASONE SODIUM PHOSPHATE 10 MG/ML IJ SOLN
INTRAMUSCULAR | Status: AC
  Filled 2019-09-16: qty 1

## 2019-09-16 MED ORDER — LACTATED RINGERS IV SOLN: INTRAVENOUS | Status: DC

## 2019-09-16 SURGICAL SUPPLY — 22 items
BAG DRAIN URO-CYSTO SKYTR STRL (DRAIN) ×3 IMPLANT
BAG DRN UROCATH (DRAIN) ×1
CATH ROBINSON RED A/P 14FR (CATHETERS) IMPLANT
CLOTH BEACON ORANGE TIMEOUT ST (SAFETY) ×3 IMPLANT
ELECT REM PT RETURN 9FT ADLT (ELECTROSURGICAL) ×3
ELECTRODE REM PT RTRN 9FT ADLT (ELECTROSURGICAL) ×1 IMPLANT
GLOVE BIO SURGEON STRL SZ7.5 (GLOVE) ×3 IMPLANT
GLOVE BIOGEL PI IND STRL 7.5 (GLOVE) IMPLANT
GLOVE BIOGEL PI INDICATOR 7.5 (GLOVE) ×4
GOWN STRL REUS W/ TWL XL LVL3 (GOWN DISPOSABLE) ×1 IMPLANT
GOWN STRL REUS W/TWL XL LVL3 (GOWN DISPOSABLE) ×6
KIT TURNOVER CYSTO (KITS) ×3 IMPLANT
MANIFOLD NEPTUNE II (INSTRUMENTS) ×3 IMPLANT
NDL SAFETY ECLIPSE 18X1.5 (NEEDLE) IMPLANT
NEEDLE HYPO 18GX1.5 SHARP (NEEDLE)
SYR 20ML LL LF (SYRINGE) IMPLANT
TRAY CYSTO PACK (CUSTOM PROCEDURE TRAY) ×3 IMPLANT
TRAY FOL W/BAG SLVR 16FR STRL (SET/KITS/TRAYS/PACK) IMPLANT
TRAY FOLEY W/BAG SLVR 16FR LF (SET/KITS/TRAYS/PACK) ×3
TUBE CONNECTING 12'X1/4 (SUCTIONS) ×1
TUBE CONNECTING 12X1/4 (SUCTIONS) ×2 IMPLANT
WATER STERILE IRR 3000ML UROMA (IV SOLUTION) ×3 IMPLANT

## 2019-09-16 NOTE — Discharge Instructions (Signed)
Transurethral Resection of Bladder Tumor, Care After This sheet gives you information about how to care for yourself after your procedure. Your health care provider may also give you more specific instructions. If you have problems or questions, contact your health care provider. What can I expect after the procedure? After the procedure, it is common to have:  A small amount of blood in your urine for up to 2 weeks.  Soreness or mild pain from your catheter. After your catheter is removed, you may have mild soreness, especially when urinating.  Pain in your lower abdomen. Follow these instructions at home: Medicines   Take over-the-counter and prescription medicines only as told by your health care provider.  If you were prescribed an antibiotic medicine, take it as told by your health care provider. Do not stop taking the antibiotic even if you start to feel better.  Do not drive for 24 hours if you were given a sedative during your procedure.  Ask your health care provider if the medicine prescribed to you: ? Requires you to avoid driving or using heavy machinery. ? Can cause constipation. You may need to take these actions to prevent or treat constipation:  Take over-the-counter or prescription medicines.  Eat foods that are high in fiber, such as beans, whole grains, and fresh fruits and vegetables.  Limit foods that are high in fat and processed sugars, such as fried or sweet foods. Activity  Return to your normal activities as told by your health care provider. Ask your health care provider what activities are safe for you.  Do not lift anything that is heavier than 10 lb (4.5 kg), or the limit that you are told, until your health care provider says that it is safe.  Avoid intense physical activity for as long as told by your health care provider.  Rest as told by your health care provider.  Avoid sitting for a long time without moving. Get up to take short walks every  1-2 hours. This is important to improve blood flow and breathing. Ask for help if you feel weak or unsteady. General instructions   Do not drink alcohol for as long as told by your health care provider. This is especially important if you are taking prescription pain medicines.  Do not take baths, swim, or use a hot tub until your health care provider approves. Ask your health care provider if you may take showers. You may only be allowed to take sponge baths.  If you have a catheter, follow instructions from your health care provider about caring for your catheter and your drainage bag.  Drink enough fluid to keep your urine pale yellow.  Wear compression stockings as told by your health care provider. These stockings help to prevent blood clots and reduce swelling in your legs.  Keep all follow-up visits as told by your health care provider. This is important. ? You will need to be followed closely with regular checks of your bladder and urethra (cystoscopies) to make sure that the cancer does not come back. Contact a health care provider if:  You have pain that gets worse or does not improve with medicine.  You have blood in your urine for more than 2 weeks.  You have cloudy or bad-smelling urine.  You become constipated. Signs of constipation may include having: ? Fewer than three bowel movements in a week. ? Difficulty having a bowel movement. ? Stools that are dry, hard, or larger than normal.  You have  a fever. Get help right away if:  You have: ? Severe pain. ? Bright red blood in your urine. ? Blood clots in your urine. ? A lot of blood in your urine.  Your catheter has been removed and you are not able to urinate.  You have a catheter in place and the catheter is not draining urine. Summary  After your procedure, it is common to have a small amount of blood in your urine, soreness or mild pain from your catheter, and pain in your lower abdomen.  Take  over-the-counter and prescription medicines only as told by your health care provider.  Rest as told by your health care provider. Follow your health care provider's instructions about returning to normal activities. Ask what activities are safe for you.  If you have a catheter, follow instructions from your health care provider about caring for your catheter and your drainage bag.  Get help right away if you cannot urinate, you have severe pain, or you have bright red blood or blood clots in your urine. This information is not intended to replace advice given to you by your health care provider. Make sure you discuss any questions you have with your health care provider. Document Revised: 11/15/2017 Document Reviewed: 11/15/2017 Elsevier Patient Education  [place] Instructions  Activity: Get plenty of rest for the remainder of the day. A responsible individual must stay with you for 24 hours following the procedure.  For the next 24 hours, DO NOT: -Drive a car -Paediatric nurse -Drink alcoholic beverages -Take any medication unless instructed by your physician -Make any legal decisions or sign important papers.  Meals: Start with liquid foods such as gelatin or soup. Progress to regular foods as tolerated. Avoid greasy, spicy, heavy foods. If nausea and/or vomiting occur, drink only clear liquids until the nausea and/or vomiting subsides. Call your physician if vomiting continues.  Special Instructions/Symptoms: Your throat may feel dry or sore from the anesthesia or the breathing tube placed in your throat during surgery. If this causes discomfort, gargle with warm salt water. The discomfort should disappear within 24 hours.

## 2019-09-16 NOTE — OR Nursing (Signed)
Prior to advancing pt to phase 2 70cc sterile water instilled into pt bladder via foley cathetor tubing.  Foley cath dc'd without difficulty.  Pt assisted to phase 2.  Approx 150 cc emptied from foley cathetor.  Darin Engels rn

## 2019-09-16 NOTE — Transfer of Care (Signed)
Immediate Anesthesia Transfer of Care Note  Patient: Rhonda Cook  Procedure(s) Performed: CYSTOSCOPY WITH BLADDER BIOPSY/ FULGURATION/ AND  INSTILLATION OF GEMCITABINE IN PACU (N/A Bladder)  Patient Location: PACU  Anesthesia Type:General  Level of Consciousness: awake, drowsy and responds to stimulation  Airway & Oxygen Therapy: Patient Spontanous Breathing and Patient connected to nasal cannula oxygen  Post-op Assessment: Report given to RN and Post -op Vital signs reviewed and stable  Post vital signs: Reviewed and stable  Last Vitals:  Vitals Value Taken Time  BP    Temp    Pulse 69 09/16/19 1127  Resp 17 09/16/19 1127  SpO2 100 % 09/16/19 1127  Vitals shown include unvalidated device data.  Last Pain:  Vitals:   09/16/19 0945  TempSrc: Oral         Complications: No apparent anesthesia complications

## 2019-09-16 NOTE — Anesthesia Procedure Notes (Signed)
Procedure Name: LMA Insertion Date/Time: 09/16/2019 10:51 AM Performed by: Lollie Sails, CRNA Pre-anesthesia Checklist: Patient identified, Emergency Drugs available, Suction available, Patient being monitored and Timeout performed Patient Re-evaluated:Patient Re-evaluated prior to induction Oxygen Delivery Method: Circle system utilized Preoxygenation: Pre-oxygenation with 100% oxygen Induction Type: IV induction Ventilation: Mask ventilation without difficulty LMA: LMA inserted LMA Size: 4.0 Number of attempts: 1 Placement Confirmation: positive ETCO2 and breath sounds checked- equal and bilateral Tube secured with: Tape Dental Injury: Teeth and Oropharynx as per pre-operative assessment

## 2019-09-16 NOTE — Anesthesia Postprocedure Evaluation (Signed)
Anesthesia Post Note  Patient: Rhonda Cook  Procedure(s) Performed: CYSTOSCOPY WITH BLADDER BIOPSY/ FULGURATION/ AND  INSTILLATION OF GEMCITABINE IN PACU (N/A Bladder)     Patient location during evaluation: PACU Anesthesia Type: General Level of consciousness: awake and alert Pain management: pain level controlled Vital Signs Assessment: post-procedure vital signs reviewed and stable Respiratory status: spontaneous breathing, nonlabored ventilation and respiratory function stable Cardiovascular status: blood pressure returned to baseline and stable Postop Assessment: no apparent nausea or vomiting Anesthetic complications: no    Last Vitals:  Vitals:   09/16/19 1330 09/16/19 1415  BP: 122/66 (!) 142/64  Pulse: 63 73  Resp: 18 18  Temp:  36.6 C  SpO2: 97% 95%    Last Pain:  Vitals:   09/16/19 1415  TempSrc:   PainSc: 0-No pain                 Lidia Collum

## 2019-09-16 NOTE — Anesthesia Preprocedure Evaluation (Signed)
Anesthesia Evaluation  Patient identified by MRN, date of birth, ID band Patient awake    Reviewed: Allergy & Precautions, NPO status , Patient's Chart, lab work & pertinent test results  History of Anesthesia Complications Negative for: history of anesthetic complications  Airway Mallampati: II  TM Distance: <3 FB Neck ROM: Full    Dental  (+) Teeth Intact   Pulmonary COPD, Current Smoker and Patient abstained from smoking.,    Pulmonary exam normal        Cardiovascular hypertension, Normal cardiovascular exam     Neuro/Psych negative neurological ROS  negative psych ROS   GI/Hepatic negative GI ROS, Neg liver ROS,   Endo/Other  Hypothyroidism   Renal/GU negative Renal ROS   Bladder cancer    Musculoskeletal  (+) Arthritis ,   Abdominal   Peds  Hematology negative hematology ROS (+)   Anesthesia Other Findings   Reproductive/Obstetrics                             Anesthesia Physical Anesthesia Plan  ASA: III  Anesthesia Plan: General   Post-op Pain Management:    Induction: Intravenous  PONV Risk Score and Plan: 3 and Ondansetron, Dexamethasone, Midazolam and Treatment may vary due to age or medical condition  Airway Management Planned: LMA  Additional Equipment: None  Intra-op Plan:   Post-operative Plan: Extubation in OR  Informed Consent: I have reviewed the patients History and Physical, chart, labs and discussed the procedure including the risks, benefits and alternatives for the proposed anesthesia with the patient or authorized representative who has indicated his/her understanding and acceptance.     Dental advisory given  Plan Discussed with:   Anesthesia Plan Comments:         Anesthesia Quick Evaluation

## 2019-09-16 NOTE — Op Note (Signed)
Operative Note  Preoperative diagnosis:  1.  Recurrent urothelial carcinoma of the bladder  Postoperative diagnosis: Same  Procedure(s): 1.  Cystoscopy with bladder biopsy and fulguration of multiple 5 mm papillary bladder tumors 2.  Intravesical instillation of gemcitabine  Surgeon: Ellison Hughs, MD  Assistants:  None  Anesthesia:  General  Complications:  None  EBL: Less than 5 mL  Specimens: 1.  Posterior bladder wall tumor  Drains/Catheters: 1.  None  Intraoperative findings:   1. Multiple 5 mm papillary bladder tumors were seen involving the posterior bladder wall and bladder dome  Indication:  Rhonda Cook is a 74 y.o. female with a history of high-grade T1 urothelial carcinoma the bladder that was initially diagnosed in January 2020.  During surveillance cystoscopy on 09/08/2019, she was found to have multiple 5 mm papillary bladder tumors scattered throughout the posterior bladder wall and bladder dome.  She has been consented for the above procedures, voices understanding and wishes to proceed.  Description of procedure:  After informed consent was obtained, the patient was brought to the operating room and general LMA anesthesia was administered. The patient was then placed in the dorsolithotomy position and prepped and draped in the usual sterile fashion. A timeout was performed. A 23 French rigid cystoscope was then inserted into the urethral meatus and advanced into the bladder under direct vision. A complete bladder survey revealed the findings listed above.  A biopsy was taken of one of the papillary bladder tumors involving the posterior bladder wall and sent to pathology for permanent section.  The remainder of the papillary tumors were then extensively fulgurated with electrocautery.  The area that was biopsied was also extensively fulgurated until hemostasis was achieved.  There were no other abnormalities noted during her cystoscopy.  A 16 French Foley  catheter was placed.  She tolerated the procedure well.  She was transferred to the postanesthesia in stable condition.  While in the recovery room 2000 mg of gemcitabine in 50 mL of water was instilled in the bladder through the catheter and the catheter was plugged. This will remain indwelling for approximately one hour. It will then be drained from the bladder and the catheter will be removed and the patient discharged home.  Plan: Drain bladder and remove Foley catheter 1 hour following gemcitabine instillation.  Follow-up on 10/02/2019 to discuss pathology results

## 2019-09-16 NOTE — H&P (Signed)
PRE-OP H&P  Office Visit Report     09/08/2019   --------------------------------------------------------------------------------   Rhonda Cook  MRN: [id number]  DOB: 1946/04/23, 74 year old Female  SSN:    PRIMARY CARE:  Collene Mares, PA-C  REFERRING:  Redmond School, MD  PROVIDER:  Ellison Hughs, M.D.  LOCATION:  [facility] [phone]     --------------------------------------------------------------------------------   CC/HPI: CC: bladder cancer   HPI: Ms. Rhonda Cook is a 74 year old female with a history of high grade T1 UCC of the bladder, s/p TURBT on 05/31/2018 with subsequent induction BCG that was completed in June 2020.   Bladder tumor pathology 05/31/2018-focally invasive high-grade papillary urothelial carcinoma. The carcinoma invades the lamina propria at the level above muscularis mucosa. Muscularis propria (detrusor muscle) is present and not involved   06/05/2019: The patient is here today for a surveillance cystoscopy. She underwent a fulguration of a superficial papillary tumor at her last visit and had an issue with a syncopal episode during/after the procedure. She denies any residual sequela from that episode. No urinary complaints today. Denies interval UTIs, dysuria or hematuria.   09/08/19: The patient is here today for a routine follow-up and surveillance cystoscopy. No urinary complaints today. She denies interval UTIs, dysuria or hematuria. Doing well.     ALLERGIES: Macrobid CAPS Oxycodone Hcl    MEDICATIONS: Levothyroxine Sodium  Lisinopril  Simvastatin  Aspir 81  Prednisone 10 mg tablet     GU PSH: Bladder Instill AntiCA Agent - 10/25/2018, 10/18/2018, 10/11/2018, 10/04/2018, 09/27/2018, 09/20/2018, 05/31/2018 Cysto Bladder Ureth Biopsy - 07/23/2018 Cystoscopy - 06/05/2019, 02/27/2019, 11/15/2018, 05/17/2018 Cystoscopy Fulguration - 03/07/2019 Cystoscopy TURBT <2 cm - 05/31/2018 Locm 300-399Mg /Ml Iodine,1Ml - 05/13/2018       PSH Notes: R  foot bunion FX repair, steel pins    NON-GU PSH: Remove Gallbladder     GU PMH: Bladder Cancer Lateral, High-grade T1 urothelial carcinoma - 06/14/2018 Bladder tumor/neoplasm - 05/17/2018 Adrenal mass Unspec, Bilateral - 05/08/2018 Chronic cystitis (with hematuria) - 05/08/2018 Gross hematuria - 05/08/2018    NON-GU PMH: Encounter for antineoplastic chemotherapy - 09/20/2018 Diverticulitis Hypercholesterolemia Hypertension    FAMILY HISTORY: 1 Daughter - No Family History Cancer - Brother Colon Cancer - Mother Kidney Cancer - Son rheumatoid arthritis - Daughter   SOCIAL HISTORY: Marital Status: Married Preferred Language: English; Race: White Current Smoking Status: Patient smokes. Smokes 1/2 pack per day.   Tobacco Use Assessment Completed: Used Tobacco in last 30 days? Does not use smokeless tobacco. Has never drank.  Drinks 2 caffeinated drinks per day. Has not had a blood transfusion.    REVIEW OF SYSTEMS:    GU Review Female:   Patient reports get up at night to urinate. Patient denies frequent urination, hard to postpone urination, burning /pain with urination, leakage of urine, stream starts and stops, trouble starting your stream, have to strain to urinate, and being pregnant.  Gastrointestinal (Upper):   Patient denies nausea, vomiting, and indigestion/ heartburn.  Gastrointestinal (Lower):   Patient denies diarrhea and constipation.  Constitutional:   Patient reports night sweats. Patient denies fever, weight loss, and fatigue.  Skin:   Patient denies skin rash/ lesion and itching.  Eyes:   Patient denies blurred vision and double vision.  Ears/ Nose/ Throat:   Patient denies sore throat and sinus problems.  Hematologic/Lymphatic:   Patient denies swollen glands and easy bruising.  Cardiovascular:   Patient denies leg swelling and chest pains.  Respiratory:   Patient denies cough  and shortness of breath.  Endocrine:   Patient denies excessive thirst.  Musculoskeletal:    Patient reports joint pain. Patient denies back pain.  Neurological:   Patient reports headaches. Patient denies dizziness.  Psychologic:   Patient denies depression and anxiety.   VITAL SIGNS:      09/08/2019 10:43 AM  Weight 128 lb / 58.06 kg  Height 63 in / 160.02 cm  BP 164/94 mmHg  Heart Rate 99 /min  Temperature 97.8 F / 36.5 C  BMI 22.7 kg/m   MULTI-SYSTEM PHYSICAL EXAMINATION:    Constitutional: Well-nourished. No physical deformities. Normally developed. Good grooming.  Neurologic / Psychiatric: Oriented to time, oriented to place, oriented to person. No depression, no anxiety, no agitation.  Musculoskeletal: Normal gait and station of head and neck.     PAST DATA REVIEW: None   PROCEDURES:         Flexible Cystoscopy - 52000  Risks, benefits, and some of the potential complications of the procedure were discussed at length with the patient including infection, bleeding, voiding discomfort, urinary retention, fever, chills, sepsis, and others. All questions were answered. Informed consent was obtained. Antibiotic prophylaxis was given. Sterile technique and intraurethral analgesia were used.  Meatus:  Normal size. Normal location. Normal condition.  Urethra:  No hypermobility. No leakage.  Ureteral Orifices:  Normal location. Normal size. Normal shape. Effluxed clear urine.  Bladder:  A right lateral wall tumor. A posterior wall tumor. < 1/2 cm tumor. No trabeculation. Normal mucosa. No stones.      The lower urinary tract was carefully examined. The procedure was well-tolerated and without complications. Antibiotic instructions were given. Instructions were given to call the office immediately for bloody urine, difficulty urinating, urinary retention, painful or frequent urination, fever, chills, nausea, vomiting or other illness. The patient stated that she understood these instructions and would comply with them.         Urinalysis w/Scope Micro  WBC/hpf: 6 - 10/hpf   RBC/hpf: 3 - 10/hpf  Bacteria: Few (10-25/hpf)  Cystals: NS (Not Seen)  Casts: NS (Not Seen)  Trichomonas: Not Present  Mucous: Not Present  Epithelial Cells: 0 - 5/hpf  Yeast: NS (Not Seen)  Sperm: Not Present    ASSESSMENT:      ICD-10 Details  1 GU:   Bladder Cancer Lateral - C67.2 Chronic, Worsening   PLAN:           Orders Labs Urine Culture          Schedule Return Visit/Planned Activity: ASAP - Schedule Surgery          Document Letter(s):  Created for Patient: Clinical Summary   Created for Redmond School, MD         Notes:   -Cystoscopy revealed multiple 5 mm bladder tumors along the posterior and right lateral wall of the bladder.  -The risks, benefits and alternatives of cystoscopy with TURBT and gemcitabine instillation was discussed with the patient. The risks include, but are not limited to, bleeding, urinary tract infection, bladder perforation requiring prolonged catheterization and/or open bladder repair, ureteral obstruction, voiding dysfunction and the inherent risks of general anesthesia. The patient voices understanding and wishes to proceed.

## 2019-09-17 LAB — SURGICAL PATHOLOGY

## 2019-10-02 DIAGNOSIS — C672 Malignant neoplasm of lateral wall of bladder: Secondary | ICD-10-CM | POA: Diagnosis not present

## 2019-10-02 DIAGNOSIS — N3021 Other chronic cystitis with hematuria: Secondary | ICD-10-CM | POA: Diagnosis not present

## 2019-10-13 ENCOUNTER — Other Ambulatory Visit: Payer: Self-pay

## 2019-10-13 ENCOUNTER — Encounter (HOSPITAL_COMMUNITY): Payer: Self-pay

## 2019-10-13 ENCOUNTER — Encounter (HOSPITAL_COMMUNITY)
Admission: RE | Admit: 2019-10-13 | Discharge: 2019-10-13 | Disposition: A | Discharge status: Home / Self Care | Payer: PPO | Source: Ambulatory Visit | Attending: Physician Assistant | Admitting: Physician Assistant

## 2019-10-13 DIAGNOSIS — M810 Age-related osteoporosis without current pathological fracture: Secondary | ICD-10-CM | POA: Diagnosis not present

## 2019-10-13 MED ORDER — DENOSUMAB 60 MG/ML ~~LOC~~ SOSY
60.0000 mg | PREFILLED_SYRINGE | Freq: Once | SUBCUTANEOUS | Status: AC
  Administered 2019-10-13: 60 mg via SUBCUTANEOUS

## 2019-10-29 DIAGNOSIS — I10 Essential (primary) hypertension: Secondary | ICD-10-CM | POA: Diagnosis not present

## 2019-10-29 DIAGNOSIS — E7849 Other hyperlipidemia: Secondary | ICD-10-CM | POA: Diagnosis not present

## 2019-10-29 DIAGNOSIS — E063 Autoimmune thyroiditis: Secondary | ICD-10-CM | POA: Diagnosis not present

## 2019-11-05 DIAGNOSIS — L820 Inflamed seborrheic keratosis: Secondary | ICD-10-CM | POA: Diagnosis not present

## 2019-11-05 DIAGNOSIS — C44529 Squamous cell carcinoma of skin of other part of trunk: Secondary | ICD-10-CM | POA: Diagnosis not present

## 2019-11-13 DIAGNOSIS — C672 Malignant neoplasm of lateral wall of bladder: Secondary | ICD-10-CM | POA: Diagnosis not present

## 2019-11-13 DIAGNOSIS — Z5111 Encounter for antineoplastic chemotherapy: Secondary | ICD-10-CM | POA: Diagnosis not present

## 2019-11-20 DIAGNOSIS — C672 Malignant neoplasm of lateral wall of bladder: Secondary | ICD-10-CM | POA: Diagnosis not present

## 2019-11-20 DIAGNOSIS — Z5111 Encounter for antineoplastic chemotherapy: Secondary | ICD-10-CM | POA: Diagnosis not present

## 2019-11-27 DIAGNOSIS — Z5111 Encounter for antineoplastic chemotherapy: Secondary | ICD-10-CM | POA: Diagnosis not present

## 2019-11-27 DIAGNOSIS — C672 Malignant neoplasm of lateral wall of bladder: Secondary | ICD-10-CM | POA: Diagnosis not present

## 2019-11-28 DIAGNOSIS — E7849 Other hyperlipidemia: Secondary | ICD-10-CM | POA: Diagnosis not present

## 2019-11-28 DIAGNOSIS — I10 Essential (primary) hypertension: Secondary | ICD-10-CM | POA: Diagnosis not present

## 2019-11-28 DIAGNOSIS — E063 Autoimmune thyroiditis: Secondary | ICD-10-CM | POA: Diagnosis not present

## 2019-12-04 DIAGNOSIS — Z5111 Encounter for antineoplastic chemotherapy: Secondary | ICD-10-CM | POA: Diagnosis not present

## 2019-12-04 DIAGNOSIS — C672 Malignant neoplasm of lateral wall of bladder: Secondary | ICD-10-CM | POA: Diagnosis not present

## 2019-12-11 DIAGNOSIS — C672 Malignant neoplasm of lateral wall of bladder: Secondary | ICD-10-CM | POA: Diagnosis not present

## 2019-12-11 DIAGNOSIS — Z5111 Encounter for antineoplastic chemotherapy: Secondary | ICD-10-CM | POA: Diagnosis not present

## 2019-12-22 DIAGNOSIS — Z20822 Contact with and (suspected) exposure to covid-19: Secondary | ICD-10-CM | POA: Diagnosis not present

## 2019-12-25 DIAGNOSIS — C672 Malignant neoplasm of lateral wall of bladder: Secondary | ICD-10-CM | POA: Diagnosis not present

## 2019-12-25 DIAGNOSIS — Z5111 Encounter for antineoplastic chemotherapy: Secondary | ICD-10-CM | POA: Diagnosis not present

## 2020-02-16 ENCOUNTER — Ambulatory Visit (INDEPENDENT_AMBULATORY_CARE_PROVIDER_SITE_OTHER): Payer: PPO

## 2020-02-16 ENCOUNTER — Ambulatory Visit
Admission: EM | Admit: 2020-02-16 | Discharge: 2020-02-16 | Disposition: A | Discharge status: Home / Self Care | Payer: PPO | Attending: Emergency Medicine | Admitting: Emergency Medicine

## 2020-02-16 ENCOUNTER — Other Ambulatory Visit: Payer: Self-pay

## 2020-02-16 ENCOUNTER — Encounter: Payer: Self-pay | Admitting: Emergency Medicine

## 2020-02-16 DIAGNOSIS — M79672 Pain in left foot: Secondary | ICD-10-CM | POA: Diagnosis not present

## 2020-02-16 NOTE — ED Triage Notes (Signed)
Pain to top of LT foot x 1 week. Denies any injury.  Pt states she just started having pain all of a sudden.  Pt is limping when she walks.

## 2020-02-16 NOTE — Discharge Instructions (Addendum)
Take OTC Tylenol as needed for pain Follow RICE instruction that is attached Follow-up with PCP Return or go to ED for worsening of symptoms

## 2020-02-16 NOTE — ED Provider Notes (Signed)
[place]   [id number] 02/16/20 Arrival Time: 4481   Chief Complaint  Patient presents with  . Foot Pain     SUBJECTIVE: History from: patient.  Rhonda Cook is a 74 y.o. female who presented to the urgent care for complaint of left foot pain for the past 1 week.  Denies any precipitating event, trauma or injury.  She localizes the pain to the left foot.  She describes the pain as constant and achy.  She has tried OTC medications without relief.  Her symptoms are made worse with ROM.  She denies similar symptoms in the past.  Denies chills, fever, nausea, vomiting, diarrhea.   ROS: As per HPI.  All other pertinent ROS negative.     Past Medical History:  Diagnosis Date  . Arthritis   . Autoimmune thyroiditis    followed by pcp  . Bladder cancer [facility])    urologist-- dr winter---  s/p TURBT 05-31-2018; 07-23-2018  . Chronic constipation   . Chronically dry eyes   . COPD (chronic obstructive pulmonary disease) ([place]) (09-11-2019 denies sob or difficulty breathing,  no URI in past month)   followed by pcp--- per pt never used oxygen, has rescue inhaler and nebulizer prn  . Diverticulosis of colon   . History of adenomatous polyp of colon 08/ 16/ 2017  . History of recurrent UTIs   . Hyperlipidemia   . Hypertension    followed by pcp   (09-11-2019 pt stated never had a stress test)  . Hypothyroidism   . Nocturia   . Osteoporosis   . PONV (postoperative nausea and vomiting)    slow to awaken, shakes if under too long   Past Surgical History:  Procedure Laterality Date  . COLONOSCOPY N/A 12/15/2015   Procedure: COLONOSCOPY;  Surgeon: Rogene Houston, MD;  Location: [facility];  Service: Endoscopy;  Laterality: N/A;  730 - moved to 8/16 @ 7:30  . CYSTOSCOPY WITH BIOPSY N/A 07/23/2018   Procedure: CYSTOSCOPY WITH BLADDER BIOPSY;  Surgeon: Ceasar Mons, MD;  Location: [facility];  Service: Urology;  Laterality: N/A;  . CYSTOSCOPY WITH BIOPSY N/A  09/16/2019   Procedure: CYSTOSCOPY WITH BLADDER BIOPSY/ FULGURATION/ AND  INSTILLATION OF GEMCITABINE IN PACU;  Surgeon: Ceasar Mons, MD;  Location: [facility];  Service: Urology;  Laterality: N/A;  . FOOT SURGERY Right 2018  . LAPAROSCOPIC CHOLECYSTECTOMY  2000  . TRANSURETHRAL RESECTION OF BLADDER TUMOR N/A 05/31/2018   Procedure: TRANSURETHRAL RESECTION OF BLADDER TUMOR (TURBT) WITH CYSTOSCOPY, INSTILLATION OF GEMCITABINE IN PACU;  Surgeon: Ceasar Mons, MD;  Location: [facility];  Service: Urology;  Laterality: N/A;   Allergies  Allergen Reactions  . Oxycodone Itching and Nausea And Vomiting  . Adhesive [Tape] Rash  . Penicillins Nausea And Vomiting and Rash    Did it involve swelling of the face/tongue/throat, SOB, or low BP? no Did it involve sudden or severe rash/hives, skin peeling, or any reaction on the inside of your mouth or nose? yes Did you need to seek medical attention at a hospital or doctor's office? no When did it last happen?20 years ago If all above answers are "NO", may proceed with cephalosporin use.   No current facility-administered medications on file prior to encounter.   Current Outpatient Medications on File Prior to Encounter  Medication Sig Dispense Refill  . albuterol (PROAIR HFA) 108 (90 Base) MCG/ACT inhaler Inhale 2 puffs into the lungs every 4 (four) hours as  needed for wheezing or shortness of breath.     Marland Kitchen albuterol (PROVENTIL) (2.5 MG/3ML) 0.083% nebulizer solution Take 2.5 mg by nebulization every 6 (six) hours as needed for wheezing or shortness of breath.     Marland Kitchen aspirin EC 81 MG tablet Take 1 tablet (81 mg total) by mouth daily.    . AZO-CRANBERRY PO Take 2 tablets by mouth daily as needed.     Marland Kitchen denosumab (PROLIA) 60 MG/ML SOSY injection Inject 60 mg into the skin every 6 (six) months.     . docusate sodium (COLACE) 100 MG capsule Take 100 mg by mouth at bedtime.    Marland Kitchen ibuprofen  (ADVIL,MOTRIN) 200 MG tablet Take 800 mg by mouth daily as needed for headache or moderate pain.    Marland Kitchen levothyroxine (SYNTHROID, LEVOTHROID) 75 MCG tablet Take 75 mcg by mouth daily.     Marland Kitchen lisinopril-hydrochlorothiazide (PRINZIDE,ZESTORETIC) 10-12.5 MG tablet Take 1 tablet by mouth daily.     . Probiotic Product (PROBIOTIC-10 PO) Take 1 capsule by mouth daily.    . simvastatin (ZOCOR) 20 MG tablet Take 20 mg by mouth at bedtime.   0  . Tetrahydrozoline HCl (VISINE OP) Place 1 drop into both eyes daily as needed (dry eyes).     Social History   Socioeconomic History  . Marital status: Married    Spouse name: Not on file  . Number of children: Not on file  . Years of education: Not on file  . Highest education level: Not on file  Occupational History  . Not on file  Tobacco Use  . Smoking status: Current Every Day Smoker    Packs/day: 0.25    Types: Cigarettes  . Smokeless tobacco: Never Used  . Tobacco comment: 09-11-2019  per pt trying to quit,  down to 1/2 pp3d  Vaping Use  . Vaping Use: Never used  Substance and Sexual Activity  . Alcohol use: No  . Drug use: Never  . Sexual activity: Not on file  Other Topics Concern  . Not on file  Social History Narrative  . Not on file   Social Determinants of Health   Financial Resource Strain:   . Difficulty of Paying Living Expenses: Not on file  Food Insecurity:   . Worried About Charity fundraiser in the Last Year: Not on file  . Ran Out of Food in the Last Year: Not on file  Transportation Needs:   . Lack of Transportation (Medical): Not on file  . Lack of Transportation (Non-Medical): Not on file  Physical Activity:   . Days of Exercise per Week: Not on file  . Minutes of Exercise per Session: Not on file  Stress:   . Feeling of Stress : Not on file  Social Connections:   . Frequency of Communication with Friends and Family: Not on file  . Frequency of Social Gatherings with Friends and Family: Not on file  . Attends  Religious Services: Not on file  . Active Member of Clubs or Organizations: Not on file  . Attends Archivist Meetings: Not on file  . Marital Status: Not on file  Intimate Partner Violence:   . Fear of Current or Ex-Partner: Not on file  . Emotionally Abused: Not on file  . Physically Abused: Not on file  . Sexually Abused: Not on file   No family history on file.  OBJECTIVE:  Vitals:   02/16/20 0905 02/16/20 0906  BP:  125/77  Pulse:  89  Resp:  18  Temp:  98 F (36.7 C)  TempSrc:  Oral  SpO2:  95%  Weight: 125 lb (56.7 kg)   Height: 5\' 3"  (1.6 m)      Physical Exam Vitals and nursing note reviewed.  Constitutional:      General: She is not in acute distress.    Appearance: Normal appearance. She is normal weight. She is not ill-appearing, toxic-appearing or diaphoretic.  HENT:     Head: Normocephalic.  Cardiovascular:     Rate and Rhythm: Normal rate and regular rhythm.     Pulses: Normal pulses.     Heart sounds: Normal heart sounds. No murmur heard.  No friction rub. No gallop.   Pulmonary:     Effort: Pulmonary effort is normal. No respiratory distress.     Breath sounds: Normal breath sounds. No stridor. No wheezing, rhonchi or rales.  Chest:     Chest wall: No tenderness.  Musculoskeletal:        General: Tenderness present.     Right foot: Normal.     Left foot: Tenderness present.     Comments: The left foot is without any obvious asymmetry or deformity when compared to the right foot.  There is no ecchymosis, open wound, lesion, warmth, or surface trauma.  Limited range of motion due to pain.  Neurovascular status intact.  Neurological:     Mental Status: She is alert and oriented to person, place, and time.     LABS:  No results found for this or any previous visit (from the past 24 hour(s)).   RADIOLOGY:  DG Foot Complete Left  Result Date: 02/16/2020 CLINICAL DATA:  Left foot pain for 1 week EXAM: LEFT FOOT - COMPLETE 3+ VIEW  COMPARISON:  None. FINDINGS: There is no evidence of fracture or dislocation. Hallux valgus and bunion. IMPRESSION: No acute or erosive finding. Electronically Signed   By: Monte Fantasia M.D.   On: 02/16/2020 09:31   The left foot x-ray is negative for bony abnormality including fracture or dislocation.  I have reviewed the x-ray myself and the radiologist interpretation.  I am in agreement with the radiologist interpretation.   ASSESSMENT & PLAN:  1. Left foot pain     No orders of the defined types were placed in this encounter.  Patient is stable at discharge.  Symptom is likely from degenerative changes likely arthritis.  Was advised to take OTC Tylenol as needed for pain and to follow-up with PCP Discharge instructions  Take OTC Tylenol as needed for pain Follow RICE instruction that is attached Follow-up with PCP Return or go to ED for worsening of symptoms  Reviewed expectations re: course of current medical issues. Questions answered. Outlined signs and symptoms indicating need for more acute intervention. Patient verbalized understanding. After Visit Summary given.         Emerson Monte, [place] 02/16/20 [phone]

## 2020-02-19 DIAGNOSIS — Z8551 Personal history of malignant neoplasm of bladder: Secondary | ICD-10-CM | POA: Diagnosis not present

## 2020-02-24 DIAGNOSIS — Z6822 Body mass index (BMI) 22.0-22.9, adult: Secondary | ICD-10-CM | POA: Diagnosis not present

## 2020-02-24 DIAGNOSIS — M7742 Metatarsalgia, left foot: Secondary | ICD-10-CM | POA: Diagnosis not present

## 2020-02-28 DIAGNOSIS — E063 Autoimmune thyroiditis: Secondary | ICD-10-CM | POA: Diagnosis not present

## 2020-02-28 DIAGNOSIS — E7849 Other hyperlipidemia: Secondary | ICD-10-CM | POA: Diagnosis not present

## 2020-02-28 DIAGNOSIS — I10 Essential (primary) hypertension: Secondary | ICD-10-CM | POA: Diagnosis not present

## 2020-03-04 DIAGNOSIS — M79672 Pain in left foot: Secondary | ICD-10-CM | POA: Diagnosis not present

## 2020-03-04 DIAGNOSIS — M79675 Pain in left toe(s): Secondary | ICD-10-CM | POA: Diagnosis not present

## 2020-03-04 DIAGNOSIS — S92325A Nondisplaced fracture of second metatarsal bone, left foot, initial encounter for closed fracture: Secondary | ICD-10-CM | POA: Diagnosis not present

## 2020-03-30 DIAGNOSIS — I10 Essential (primary) hypertension: Secondary | ICD-10-CM | POA: Diagnosis not present

## 2020-03-30 DIAGNOSIS — E7849 Other hyperlipidemia: Secondary | ICD-10-CM | POA: Diagnosis not present

## 2020-03-30 DIAGNOSIS — E063 Autoimmune thyroiditis: Secondary | ICD-10-CM | POA: Diagnosis not present

## 2020-04-01 DIAGNOSIS — M79675 Pain in left toe(s): Secondary | ICD-10-CM | POA: Diagnosis not present

## 2020-04-01 DIAGNOSIS — M25579 Pain in unspecified ankle and joints of unspecified foot: Secondary | ICD-10-CM | POA: Diagnosis not present

## 2020-04-01 DIAGNOSIS — M79671 Pain in right foot: Secondary | ICD-10-CM | POA: Diagnosis not present

## 2020-04-01 DIAGNOSIS — M79672 Pain in left foot: Secondary | ICD-10-CM | POA: Diagnosis not present

## 2020-04-01 DIAGNOSIS — M79674 Pain in right toe(s): Secondary | ICD-10-CM | POA: Diagnosis not present

## 2020-04-13 ENCOUNTER — Other Ambulatory Visit: Payer: Self-pay

## 2020-04-13 ENCOUNTER — Encounter (HOSPITAL_COMMUNITY)
Admission: RE | Admit: 2020-04-13 | Discharge: 2020-04-13 | Disposition: A | Discharge status: Home / Self Care | Payer: PPO | Source: Ambulatory Visit | Attending: Physician Assistant | Admitting: Physician Assistant

## 2020-04-13 ENCOUNTER — Encounter (HOSPITAL_COMMUNITY): Payer: Self-pay

## 2020-04-13 DIAGNOSIS — M810 Age-related osteoporosis without current pathological fracture: Secondary | ICD-10-CM | POA: Diagnosis not present

## 2020-04-13 MED ORDER — DENOSUMAB 60 MG/ML ~~LOC~~ SOSY
60.0000 mg | PREFILLED_SYRINGE | Freq: Once | SUBCUTANEOUS | Status: AC
  Administered 2020-04-13: 60 mg via SUBCUTANEOUS

## 2020-04-30 DIAGNOSIS — E7849 Other hyperlipidemia: Secondary | ICD-10-CM | POA: Diagnosis not present

## 2020-04-30 DIAGNOSIS — E063 Autoimmune thyroiditis: Secondary | ICD-10-CM | POA: Diagnosis not present

## 2020-04-30 DIAGNOSIS — I10 Essential (primary) hypertension: Secondary | ICD-10-CM | POA: Diagnosis not present

## 2020-05-06 DIAGNOSIS — Z20822 Contact with and (suspected) exposure to covid-19: Secondary | ICD-10-CM | POA: Diagnosis not present

## 2020-05-29 DIAGNOSIS — E063 Autoimmune thyroiditis: Secondary | ICD-10-CM | POA: Diagnosis not present

## 2020-05-29 DIAGNOSIS — E7849 Other hyperlipidemia: Secondary | ICD-10-CM | POA: Diagnosis not present

## 2020-05-29 DIAGNOSIS — I10 Essential (primary) hypertension: Secondary | ICD-10-CM | POA: Diagnosis not present

## 2020-06-24 DIAGNOSIS — Z8551 Personal history of malignant neoplasm of bladder: Secondary | ICD-10-CM | POA: Diagnosis not present

## 2020-06-28 DIAGNOSIS — E7849 Other hyperlipidemia: Secondary | ICD-10-CM | POA: Diagnosis not present

## 2020-06-28 DIAGNOSIS — I10 Essential (primary) hypertension: Secondary | ICD-10-CM | POA: Diagnosis not present

## 2020-06-28 DIAGNOSIS — E063 Autoimmune thyroiditis: Secondary | ICD-10-CM | POA: Diagnosis not present

## 2020-07-08 ENCOUNTER — Ambulatory Visit (HOSPITAL_COMMUNITY)
Admission: RE | Admit: 2020-07-08 | Discharge: 2020-07-08 | Disposition: A | Discharge status: Home / Self Care | Payer: PPO | Source: Ambulatory Visit | Attending: Family Medicine | Admitting: Family Medicine

## 2020-07-08 ENCOUNTER — Other Ambulatory Visit: Payer: Self-pay

## 2020-07-08 ENCOUNTER — Other Ambulatory Visit (HOSPITAL_COMMUNITY): Payer: Self-pay | Admitting: Family Medicine

## 2020-07-08 DIAGNOSIS — M545 Low back pain, unspecified: Secondary | ICD-10-CM

## 2020-07-08 DIAGNOSIS — M5416 Radiculopathy, lumbar region: Secondary | ICD-10-CM | POA: Diagnosis not present

## 2020-07-08 DIAGNOSIS — M25551 Pain in right hip: Secondary | ICD-10-CM | POA: Diagnosis not present

## 2020-07-08 DIAGNOSIS — Z6822 Body mass index (BMI) 22.0-22.9, adult: Secondary | ICD-10-CM | POA: Diagnosis not present

## 2020-07-08 DIAGNOSIS — Z1331 Encounter for screening for depression: Secondary | ICD-10-CM | POA: Diagnosis not present

## 2020-07-20 DIAGNOSIS — H52203 Unspecified astigmatism, bilateral: Secondary | ICD-10-CM | POA: Diagnosis not present

## 2020-07-20 DIAGNOSIS — Z85828 Personal history of other malignant neoplasm of skin: Secondary | ICD-10-CM | POA: Diagnosis not present

## 2020-07-20 DIAGNOSIS — Z961 Presence of intraocular lens: Secondary | ICD-10-CM | POA: Diagnosis not present

## 2020-07-20 DIAGNOSIS — H524 Presbyopia: Secondary | ICD-10-CM | POA: Diagnosis not present

## 2020-07-20 DIAGNOSIS — Z08 Encounter for follow-up examination after completed treatment for malignant neoplasm: Secondary | ICD-10-CM | POA: Diagnosis not present

## 2020-07-20 DIAGNOSIS — H5213 Myopia, bilateral: Secondary | ICD-10-CM | POA: Diagnosis not present

## 2020-07-20 DIAGNOSIS — L820 Inflamed seborrheic keratosis: Secondary | ICD-10-CM | POA: Diagnosis not present

## 2020-07-28 DIAGNOSIS — I10 Essential (primary) hypertension: Secondary | ICD-10-CM | POA: Diagnosis not present

## 2020-07-28 DIAGNOSIS — E7849 Other hyperlipidemia: Secondary | ICD-10-CM | POA: Diagnosis not present

## 2020-07-28 DIAGNOSIS — E063 Autoimmune thyroiditis: Secondary | ICD-10-CM | POA: Diagnosis not present

## 2020-08-03 DIAGNOSIS — M5416 Radiculopathy, lumbar region: Secondary | ICD-10-CM | POA: Diagnosis not present

## 2020-08-03 DIAGNOSIS — M5136 Other intervertebral disc degeneration, lumbar region: Secondary | ICD-10-CM | POA: Diagnosis not present

## 2020-08-03 DIAGNOSIS — M5459 Other low back pain: Secondary | ICD-10-CM | POA: Diagnosis not present

## 2020-08-26 DIAGNOSIS — M545 Low back pain, unspecified: Secondary | ICD-10-CM | POA: Diagnosis not present

## 2020-08-28 DIAGNOSIS — E063 Autoimmune thyroiditis: Secondary | ICD-10-CM | POA: Diagnosis not present

## 2020-08-28 DIAGNOSIS — E7849 Other hyperlipidemia: Secondary | ICD-10-CM | POA: Diagnosis not present

## 2020-08-28 DIAGNOSIS — I10 Essential (primary) hypertension: Secondary | ICD-10-CM | POA: Diagnosis not present

## 2020-09-03 DIAGNOSIS — M5459 Other low back pain: Secondary | ICD-10-CM | POA: Diagnosis not present

## 2020-09-23 DIAGNOSIS — Z8551 Personal history of malignant neoplasm of bladder: Secondary | ICD-10-CM | POA: Diagnosis not present

## 2020-09-30 DIAGNOSIS — M5416 Radiculopathy, lumbar region: Secondary | ICD-10-CM | POA: Diagnosis not present

## 2020-10-12 ENCOUNTER — Encounter (HOSPITAL_COMMUNITY): Admission: RE | Admit: 2020-10-12 | Payer: PPO | Source: Ambulatory Visit

## 2020-10-21 ENCOUNTER — Other Ambulatory Visit: Payer: Self-pay

## 2020-10-21 ENCOUNTER — Encounter (HOSPITAL_COMMUNITY)
Admission: RE | Admit: 2020-10-21 | Discharge: 2020-10-21 | Disposition: A | Discharge status: Home / Self Care | Payer: PPO | Source: Ambulatory Visit | Attending: Physician Assistant | Admitting: Physician Assistant

## 2020-10-21 DIAGNOSIS — M810 Age-related osteoporosis without current pathological fracture: Secondary | ICD-10-CM | POA: Insufficient documentation

## 2020-10-21 LAB — BASIC METABOLIC PANEL
Anion gap: 8 (ref 5–15)
BUN: 14 mg/dL (ref 8–23)
CO2: 27 mmol/L (ref 22–32)
Calcium: 9.5 mg/dL (ref 8.9–10.3)
Chloride: 100 mmol/L (ref 98–111)
Creatinine, Ser: 0.66 mg/dL (ref 0.44–1.00)
GFR, Estimated: >60 mL/min (ref >60)
Glucose, Bld: 98 mg/dL (ref 70–99)
Potassium: 3.9 mmol/L (ref 3.5–5.1)
Sodium: 135 mmol/L (ref 135–145)

## 2020-10-21 MED ORDER — DENOSUMAB 60 MG/ML ~~LOC~~ SOSY
60.0000 mg | PREFILLED_SYRINGE | Freq: Once | SUBCUTANEOUS | Status: AC
  Administered 2020-10-21: 60 mg via SUBCUTANEOUS
  Filled 2020-10-21: qty 1

## 2020-10-25 ENCOUNTER — Other Ambulatory Visit: Payer: Self-pay

## 2020-10-25 ENCOUNTER — Ambulatory Visit (HOSPITAL_COMMUNITY): Payer: PPO | Attending: Orthopedic Surgery | Admitting: Physical Therapy

## 2020-10-25 DIAGNOSIS — R262 Difficulty in walking, not elsewhere classified: Secondary | ICD-10-CM | POA: Diagnosis not present

## 2020-10-25 DIAGNOSIS — M6281 Muscle weakness (generalized): Secondary | ICD-10-CM | POA: Diagnosis not present

## 2020-10-25 NOTE — Therapy (Signed)
[place], [place], [address] Phone: [phone]   Fax:  [phone]  Physical Therapy Evaluation  Patient Details  Name: GINI CAPUTO MRN: [id number] Date of Birth: [birth year] Referring Provider (PT): Melina Schools MD   Encounter Date: 10/25/2020   PT End of Session - 10/25/20 1533     Visit Number 1    Number of Visits 8    Date for PT Re-Evaluation 12/20/20    Authorization Type [employer], no VL or auth    Progress Note Due on Visit 10    PT Start Time 7591    PT Stop Time 1605    PT Time Calculation (min) 34 min    Activity Tolerance Patient limited by pain             Past Medical History:  Diagnosis Date   Arthritis    Autoimmune thyroiditis    followed by pcp   Bladder cancer [facility])    urologist-- dr winter---  s/p TURBT 05-31-2018; 07-23-2018   Chronic constipation    Chronically dry eyes    COPD (chronic obstructive pulmonary disease) ([place]) (09-11-2019 denies sob or difficulty breathing,  no URI in past month)   followed by pcp--- per pt never used oxygen, has rescue inhaler and nebulizer prn   Diverticulosis of colon    History of adenomatous polyp of colon 08/ 16/ 2017   History of recurrent UTIs    Hyperlipidemia    Hypertension    followed by pcp   (09-11-2019 pt stated never had a stress test)   Hypothyroidism    Nocturia    Osteoporosis    PONV (postoperative nausea and vomiting)    slow to awaken, shakes if under too long    Past Surgical History:  Procedure Laterality Date   COLONOSCOPY N/A 12/15/2015   Procedure: COLONOSCOPY;  Surgeon: Rogene Houston, MD;  [facility];  Service: Endoscopy;  Laterality: N/A;  730 - moved to 8/16 @ 7:30   CYSTOSCOPY WITH BIOPSY N/A 07/23/2018   Procedure: CYSTOSCOPY WITH BLADDER BIOPSY;  Surgeon: Ceasar Mons, MD;  Location: [facility];  Service: Urology;  Laterality: N/A;   CYSTOSCOPY WITH BIOPSY N/A  09/16/2019   Procedure: CYSTOSCOPY WITH BLADDER BIOPSY/ FULGURATION/ AND  INSTILLATION OF GEMCITABINE IN PACU;  Surgeon: Ceasar Mons, MD;  Location: [facility];  Service: Urology;  Laterality: N/A;   FOOT SURGERY Right 2018   LAPAROSCOPIC CHOLECYSTECTOMY  2000   TRANSURETHRAL RESECTION OF BLADDER TUMOR N/A 05/31/2018   Procedure: TRANSURETHRAL RESECTION OF BLADDER TUMOR (TURBT) WITH CYSTOSCOPY, INSTILLATION OF GEMCITABINE IN PACU;  Surgeon: Ceasar Mons, MD;  Location: [facility];  Service: Urology;  Laterality: N/A;    There were no vitals filed for this visit.    Subjective Assessment - 10/25/20 1540     Subjective States that she is having lower back pain and into her right hip and leg pain. States this has been going on for a couple of years. States that they gave her a shot in her back about a month ago and that didn't help. Current pain is 9/10 and described as throbbing. States that bending and gardening aggravating. States that night time hurts. Nothing makes it feel better. States she occasionally takes tramadol but it doesn't get rid of the pain.  States that she would like to be able to walk and get around without any pain.  Pain  currently radiates down right side of leg and to the ankle.    Pertinent History COPD, bladder cancer in remission    Limitations Walking;Sitting;Standing    How long can you walk comfortably? 30 minutes    Patient Stated Goals to be able to walk better    Currently in Pain? Yes    Pain Score 9     Pain Location Back    Pain Orientation Right    Pain Descriptors / Indicators Throbbing    Pain Radiating Towards to right hip                [facility] PT Assessment - 10/25/20 0001       Assessment   Medical Diagnosis LBP    Referring Provider (PT) Melina Schools MD    Prior Therapy for arm a while ago      Balance Screen   Has the patient fallen in the past 6 months No      Prior Function    Level of Independence Independent      Cognition   Overall Cognitive Status Within Functional Limits for tasks assessed      Observation/Other Assessments   Focus on Therapeutic Outcomes (FOTO)  58% function      ROM / Strength   AROM / PROM / Strength AROM;Strength      AROM   Overall AROM Comments trialed active and passive hip ROM on right - guarding in right hip unable to get measurements or movmenets during session    AROM Assessment Site Lumbar    Lumbar Flexion 50% limited   increased right hip pain   Lumbar Extension 75% limited   increased right hip pain   Lumbar - Right Side Bend 100% limited   increased right hip pain   Lumbar - Left Side Bend 75% limited   no change in symptoms     Strength   Overall Strength Comments painful - active gaurding throuhgout session unable to get measurements    Strength Assessment Site Hip;Knee;Ankle    Right/Left Hip Right;Left      Palpation   Palpation comment increased resting tone in right hip musculature      Bed Mobility   Bed Mobility Rolling Right;Rolling Left;Supine to Sit;Sit to Supine    Rolling Right --   assists right LE, painful   Rolling Left --   assists right LE, painful   Supine to Sit --   assists right LE, painful   Sit to Supine --   assists right LE, painful     Transfers   Transfers Sit to Stand;Stand to Sit    Sit to Stand With upper extremity assist   favors left with tr                       Objective measurements completed on examination: See above findings.       [facility] Adult PT Treatment/Exercise - 10/25/20 0001       Exercises   Exercises Lumbar      Lumbar Exercises: Supine   Glut Set 10 reps;5 seconds    Other Supine Lumbar Exercises hip abd/clams in supine x15 5" holds                    PT Education - 10/25/20 1610     Education Details on current condition, on HEP, POC and log rolling out of bed    Person(s) Educated Patient    Methods  Explanation     Comprehension Verbalized understanding              PT Short Term Goals - 10/25/20 1607       PT SHORT TERM GOAL #1   Title Patient will be able to transition from supine to sit without increased pain    Time 4    Period Weeks    Status New    Target Date 11/22/20      PT SHORT TERM GOAL #2   Title Patient will report at least 25% improvement in overall symptoms and/or function to demonstrate improved functional mobility    Time 4    Period Weeks    Status New    Target Date 11/22/20      PT SHORT TERM GOAL #3   Title Patient will be independent in self management strategies to improve quality of life and functional outcomes.    Time 4    Period Weeks    Status New    Target Date 11/22/20               PT Long Term Goals - 10/25/20 1608       PT LONG TERM GOAL #1   Title Patient will report at least 50% improvement in overall symptoms and/or function to demonstrate improved functional mobility    Time 8    Period Weeks    Status New    Target Date 12/20/20      PT LONG TERM GOAL #2   Title Patient will be able to demonstrate at least 50% of functional lumbar ROM    Time 8    Period Weeks    Status New    Target Date 12/20/20      PT LONG TERM GOAL #3   Title Patient will improve on FOTO score to meet predicted outcomes to demonstrate improved functional mobility.    Time 8    Period Weeks    Status New                    Plan - 10/25/20 1605     Clinical Impression Statement Patient is a49 y.o. female who presents to physical therapy with complaint of right low back and leg pain. Patient currently getting active treatment for bladder cancer and has a history of multiple surgeries on her bladder which are likely contributing to current condition. Patient with intense muscle guarding on this date and poor tolerance to ROM or interventions. Patient demonstrates decreased strength, ROM restriction, and gait abnormalities which are likely  contributing to symptoms of pain and are negatively impacting patient ability to perform ADLs and functional mobility tasks. Patient will benefit from skilled physical therapy services to address these deficits to reduce pain, improve level of function with ADLs, functional mobility tasks, and reduce risk for falls.    Personal Factors and Comorbidities Comorbidity 1;Comorbidity 2;Comorbidity 3+    Comorbidities bladder cancer - active treatments with history of multiple surgeries, COPD    Examination-Activity Limitations Bed Mobility;Squat;Sleep;Sit;Bend;Stairs;Stand;Transfers;Locomotion Level    Examination-Participation Restrictions Yard Work;Community Activity;Cleaning    Stability/Clinical Decision Making Evolving/Moderate complexity    Clinical Decision Making Moderate    Rehab Potential Fair    PT Frequency 1x / week    PT Duration 8 weeks    PT Treatment/Interventions ADLs/Self Care Home Management;Cryotherapy;Electrical Stimulation;Moist Heat;Traction;Therapeutic exercise;Therapeutic activities;Gait training;Patient/family education;Neuromuscular re-education;Manual techniques;Spinal Manipulations    PT Next Visit Plan pain managements strategies, decreased muscle guard, isoemtrics, gentle  ROM    PT Home Exercise Plan glute squeeze, supine butterfly    Consulted and Agree with Plan of Care Patient             Patient will benefit from skilled therapeutic intervention in order to improve the following deficits and impairments:  Decreased activity tolerance, Pain, Decreased mobility, Decreased range of motion, Decreased strength  Visit Diagnosis: Muscle weakness (generalized)  Difficulty in walking, not elsewhere classified     Problem List Patient Active Problem List   Diagnosis Date Noted   HAND, ARTHRITIS, DEGEN./OSTEO 01/18/2009   HEMANGIOMA OF UNSPECIFIED SITE 07/22/2007   CERVICAL SPONDYLOSIS WITHOUT MYELOPATHY 07/22/2007   CERVICALGIA 07/22/2007   SPINAL STENOSIS  07/22/2007    4:11 PM, 10/25/20 Jerene Pitch, DPT Physical Therapy with [facility]  [phone] office   [facility] [address] [place], [place], [address] Phone: [phone]   Fax:  [phone]  Name: BIRTHA HATLER MRN: [id number] Date of Birth: 09-Jun-1945

## 2020-10-28 DIAGNOSIS — Z5111 Encounter for antineoplastic chemotherapy: Secondary | ICD-10-CM | POA: Diagnosis not present

## 2020-10-28 DIAGNOSIS — C672 Malignant neoplasm of lateral wall of bladder: Secondary | ICD-10-CM | POA: Diagnosis not present

## 2020-11-03 ENCOUNTER — Ambulatory Visit (HOSPITAL_COMMUNITY): Payer: PPO | Admitting: Physical Therapy

## 2020-11-04 DIAGNOSIS — C672 Malignant neoplasm of lateral wall of bladder: Secondary | ICD-10-CM | POA: Diagnosis not present

## 2020-11-04 DIAGNOSIS — Z5111 Encounter for antineoplastic chemotherapy: Secondary | ICD-10-CM | POA: Diagnosis not present

## 2020-11-10 ENCOUNTER — Ambulatory Visit (HOSPITAL_COMMUNITY): Payer: PPO | Admitting: Physical Therapy

## 2020-11-11 DIAGNOSIS — C672 Malignant neoplasm of lateral wall of bladder: Secondary | ICD-10-CM | POA: Diagnosis not present

## 2020-11-11 DIAGNOSIS — Z5111 Encounter for antineoplastic chemotherapy: Secondary | ICD-10-CM | POA: Diagnosis not present

## 2020-11-17 ENCOUNTER — Ambulatory Visit (HOSPITAL_COMMUNITY): Payer: PPO | Attending: Orthopedic Surgery | Admitting: Physical Therapy

## 2020-11-17 ENCOUNTER — Encounter (HOSPITAL_COMMUNITY): Payer: Self-pay | Admitting: Physical Therapy

## 2020-11-17 ENCOUNTER — Other Ambulatory Visit: Payer: Self-pay

## 2020-11-17 DIAGNOSIS — M6281 Muscle weakness (generalized): Secondary | ICD-10-CM | POA: Insufficient documentation

## 2020-11-17 DIAGNOSIS — R262 Difficulty in walking, not elsewhere classified: Secondary | ICD-10-CM | POA: Diagnosis not present

## 2020-11-17 NOTE — Therapy (Signed)
[place], [place], [address] Phone: [phone]   Fax:  [phone]  Physical Therapy Treatment  Patient Details  Name: Rhonda Cook MRN: [id number] Date of Birth: 1946-02-10 Referring Provider (PT): Melina Schools MD   Encounter Date: 11/17/2020   PT End of Session - 11/17/20 1046     Visit Number 2    Number of Visits 8    Date for PT Re-Evaluation 12/20/20    Authorization Type [employer], no VL or auth    Progress Note Due on Visit 10    PT Start Time 2094    PT Stop Time 1125    PT Time Calculation (min) 40 min    Activity Tolerance Patient limited by pain             Past Medical History:  Diagnosis Date   Arthritis    Autoimmune thyroiditis    followed by pcp   Bladder cancer [facility])    urologist-- dr winter---  s/p TURBT 05-31-2018; 07-23-2018   Chronic constipation    Chronically dry eyes    COPD (chronic obstructive pulmonary disease) ([place]) (09-11-2019 denies sob or difficulty breathing,  no URI in past month)   followed by pcp--- per pt never used oxygen, has rescue inhaler and nebulizer prn   Diverticulosis of colon    History of adenomatous polyp of colon 08/ 16/ 2017   History of recurrent UTIs    Hyperlipidemia    Hypertension    followed by pcp   (09-11-2019 pt stated never had a stress test)   Hypothyroidism    Nocturia    Osteoporosis    PONV (postoperative nausea and vomiting)    slow to awaken, shakes if under too long    Past Surgical History:  Procedure Laterality Date   COLONOSCOPY N/A 12/15/2015   Procedure: COLONOSCOPY;  Surgeon: Rogene Houston, MD;  [facility];  Service: Endoscopy;  Laterality: N/A;  730 - moved to 8/16 @ 7:30   CYSTOSCOPY WITH BIOPSY N/A 07/23/2018   Procedure: CYSTOSCOPY WITH BLADDER BIOPSY;  Surgeon: Ceasar Mons, MD;  [facility];  Service: Urology;  Laterality: N/A;   CYSTOSCOPY WITH BIOPSY N/A 09/16/2019    Procedure: CYSTOSCOPY WITH BLADDER BIOPSY/ FULGURATION/ AND  INSTILLATION OF GEMCITABINE IN PACU;  Surgeon: Ceasar Mons, MD;  Location: [facility];  Service: Urology;  Laterality: N/A;   FOOT SURGERY Right 2018   LAPAROSCOPIC CHOLECYSTECTOMY  2000   TRANSURETHRAL RESECTION OF BLADDER TUMOR N/A 05/31/2018   Procedure: TRANSURETHRAL RESECTION OF BLADDER TUMOR (TURBT) WITH CYSTOSCOPY, INSTILLATION OF GEMCITABINE IN PACU;  Surgeon: Ceasar Mons, MD;  Location: [facility];  Service: Urology;  Laterality: N/A;    There were no vitals filed for this visit.   Subjective Assessment - 11/17/20 1049     Subjective States that her back is terrible. States that she has had 3 weeks of treatment so she couldn't come because she was so sick. Current pain is 9/10. States she can't sweep or do anything without it hurting. States pain is currently going down the right leg.    Pertinent History COPD, bladder cancer in remission    Limitations Walking;Sitting;Standing    How long can you walk comfortably? 30 minutes    Patient Stated Goals to be able to walk better    Currently in Pain? Yes    Pain Score 9  Pain Location Back    Pain Orientation Right    Pain Descriptors / Indicators Shooting;Burning    Pain Type Chronic pain    Pain Radiating Towards to right leg                [facility] PT Assessment - 11/17/20 0001       Assessment   Medical Diagnosis LBP    Referring Provider (PT) Melina Schools MD                           [facility] Adult PT Treatment/Exercise - 11/17/20 0001       Lumbar Exercises: Stretches   Single Knee to Chest Stretch Left;Right;10 seconds   x10 each   Lower Trunk Rotation --   2 minutes     Lumbar Exercises: Supine   Bridge 2 seconds   x25 with rest breaks as needed   Other Supine Lumbar Exercises hip add isometric x30 5" holds with ball; clams supine 2 minutes; bent knee fall ins x2 of 2  minutes bilateral                      PT Short Term Goals - 10/25/20 1607       PT SHORT TERM GOAL #1   Title Patient will be able to transition from supine to sit without increased pain    Time 4    Period Weeks    Status New    Target Date 11/22/20      PT SHORT TERM GOAL #2   Title Patient will report at least 25% improvement in overall symptoms and/or function to demonstrate improved functional mobility    Time 4    Period Weeks    Status New    Target Date 11/22/20      PT SHORT TERM GOAL #3   Title Patient will be independent in self management strategies to improve quality of life and functional outcomes.    Time 4    Period Weeks    Status New    Target Date 11/22/20               PT Long Term Goals - 10/25/20 1608       PT LONG TERM GOAL #1   Title Patient will report at least 50% improvement in overall symptoms and/or function to demonstrate improved functional mobility    Time 8    Period Weeks    Status New    Target Date 12/20/20      PT LONG TERM GOAL #2   Title Patient will be able to demonstrate at least 50% of functional lumbar ROM    Time 8    Period Weeks    Status New    Target Date 12/20/20      PT LONG TERM GOAL #3   Title Patient will improve on FOTO score to meet predicted outcomes to demonstrate improved functional mobility.    Time 8    Period Weeks    Status New                   Plan - 11/17/20 1129     Clinical Impression Statement Session focused on gentle Rom and strengthening which was tolerated moderately well. fatigue noted but overall slight decrease in pain in back and leg noted. Added new exercises to HEP and encouraged patient to perform daily. Will  follow up with patient once  she returns from the beach.    Personal Factors and Comorbidities Comorbidity 1;Comorbidity 2;Comorbidity 3+    Comorbidities bladder cancer - active treatments with history of multiple surgeries, COPD     Examination-Activity Limitations Bed Mobility;Squat;Sleep;Sit;Bend;Stairs;Stand;Transfers;Locomotion Level    Examination-Participation Restrictions Yard Work;Community Activity;Cleaning    Stability/Clinical Decision Making Evolving/Moderate complexity    Rehab Potential Fair    PT Frequency 1x / week    PT Duration 8 weeks    PT Treatment/Interventions ADLs/Self Care Home Management;Cryotherapy;Electrical Stimulation;Moist Heat;Traction;Therapeutic exercise;Therapeutic activities;Gait training;Patient/family education;Neuromuscular re-education;Manual techniques;Spinal Manipulations    PT Next Visit Plan pain managements strategies, decreased muscle guard, isoemtrics, gentle ROM    PT Home Exercise Plan glute squeeze, supine butterfly, SKC, LTR, bridge, hip IR    Consulted and Agree with Plan of Care Patient             Patient will benefit from skilled therapeutic intervention in order to improve the following deficits and impairments:  Decreased activity tolerance, Pain, Decreased mobility, Decreased range of motion, Decreased strength  Visit Diagnosis: Muscle weakness (generalized)  Difficulty in walking, not elsewhere classified     Problem List Patient Active Problem List   Diagnosis Date Noted   HAND, ARTHRITIS, DEGEN./OSTEO 01/18/2009   HEMANGIOMA OF UNSPECIFIED SITE 07/22/2007   CERVICAL SPONDYLOSIS WITHOUT MYELOPATHY 07/22/2007   CERVICALGIA 07/22/2007   SPINAL STENOSIS 07/22/2007   11:29 AM, 11/17/20 Jerene Pitch, DPT Physical Therapy with [facility]  [phone] office   [place], [place], [address] Phone: [phone]   Fax:  [phone]  Name: Rhonda Cook MRN: [id number] Date of Birth: March 17, 1946

## 2020-11-25 ENCOUNTER — Encounter (HOSPITAL_COMMUNITY): Payer: PPO | Admitting: Physical Therapy

## 2020-11-28 DIAGNOSIS — E782 Mixed hyperlipidemia: Secondary | ICD-10-CM | POA: Diagnosis not present

## 2020-11-28 DIAGNOSIS — I10 Essential (primary) hypertension: Secondary | ICD-10-CM | POA: Diagnosis not present

## 2020-11-28 DIAGNOSIS — E063 Autoimmune thyroiditis: Secondary | ICD-10-CM | POA: Diagnosis not present

## 2020-12-01 ENCOUNTER — Ambulatory Visit (HOSPITAL_COMMUNITY): Payer: PPO | Admitting: Physical Therapy

## 2020-12-08 ENCOUNTER — Ambulatory Visit (HOSPITAL_COMMUNITY): Payer: PPO | Attending: Orthopedic Surgery | Admitting: Physical Therapy

## 2020-12-08 ENCOUNTER — Other Ambulatory Visit: Payer: Self-pay

## 2020-12-08 DIAGNOSIS — M6281 Muscle weakness (generalized): Secondary | ICD-10-CM | POA: Insufficient documentation

## 2020-12-08 DIAGNOSIS — R262 Difficulty in walking, not elsewhere classified: Secondary | ICD-10-CM | POA: Diagnosis not present

## 2020-12-08 NOTE — Therapy (Addendum)
[facility] [address] [place], [place], [address] Phone: [phone]   Fax:  [phone]  Physical Therapy Treatment and Discharge Note   Patient Details  Name: Rhonda Cook MRN: [id number] Date of Birth: Mar 09, 1946 Referring Provider (PT): Melina Schools MD  PHYSICAL THERAPY DISCHARGE SUMMARY  Visits from Start of Care: 3  Current functional level related to goals / functional outcomes: Unable to assess due to unplanned discharge   Remaining deficits: Unable to assess due to unplanned discharge  Education / Equipment: See below  Patient agrees to discharge. Patient goals were not met. Patient is being discharged due to not returning since the last visit.  3:08 PM, 04/15/21 Jerene Pitch, DPT Physical Therapy with [facility]  [phone] office    Encounter Date: 12/08/2020   PT End of Session - 12/08/20 1151     Visit Number 3    Number of Visits 8    Date for PT Re-Evaluation 12/20/20    Authorization Type [employer], no VL or auth    Progress Note Due on Visit 10    PT Start Time 1135    PT Stop Time 1215    PT Time Calculation (min) 40 min    Activity Tolerance Patient limited by pain             Past Medical History:  Diagnosis Date   Arthritis    Autoimmune thyroiditis    followed by pcp   Bladder cancer [facility])    urologist-- dr winter---  s/p TURBT 05-31-2018; 07-23-2018   Chronic constipation    Chronically dry eyes    COPD (chronic obstructive pulmonary disease) ([place]) (09-11-2019 denies sob or difficulty breathing,  no URI in past month)   followed by pcp--- per pt never used oxygen, has rescue inhaler and nebulizer prn   Diverticulosis of colon    History of adenomatous polyp of colon 08/ 16/ 2017   History of recurrent UTIs    Hyperlipidemia    Hypertension    followed by pcp   (09-11-2019 pt stated never had a stress test)   Hypothyroidism    Nocturia     Osteoporosis    PONV (postoperative nausea and vomiting)    slow to awaken, shakes if under too long    Past Surgical History:  Procedure Laterality Date   COLONOSCOPY N/A 12/15/2015   Procedure: COLONOSCOPY;  Surgeon: Rogene Houston, MD;  [facility];  Service: Endoscopy;  Laterality: N/A;  730 - moved to 8/16 @ 7:30   CYSTOSCOPY WITH BIOPSY N/A 07/23/2018   Procedure: CYSTOSCOPY WITH BLADDER BIOPSY;  Surgeon: Ceasar Mons, MD;  Location: [facility];  Service: Urology;  Laterality: N/A;   CYSTOSCOPY WITH BIOPSY N/A 09/16/2019   Procedure: CYSTOSCOPY WITH BLADDER BIOPSY/ FULGURATION/ AND  INSTILLATION OF GEMCITABINE IN PACU;  Surgeon: Ceasar Mons, MD;  Location: [facility];  Service: Urology;  Laterality: N/A;   FOOT SURGERY Right 2018   LAPAROSCOPIC CHOLECYSTECTOMY  2000   TRANSURETHRAL RESECTION OF BLADDER TUMOR N/A 05/31/2018   Procedure: TRANSURETHRAL RESECTION OF BLADDER TUMOR (TURBT) WITH CYSTOSCOPY, INSTILLATION OF GEMCITABINE IN PACU;  Surgeon: Ceasar Mons, MD;  Location: [facility];  Service: Urology;  Laterality: N/A;    There were no vitals filed for this visit.   Subjective Assessment - 12/08/20 1137     Subjective Rhonda Cook states that her pain is not that bad today maybe  a 2-3 ; she only has pain on the Rt side which radiates to her foot.    Pertinent History COPD, bladder cancer in remission    Limitations Walking;Sitting;Standing    How long can you walk comfortably? 30 minutes    Patient Stated Goals to be able to walk better    Currently in Pain? Yes    Pain Score 3     Pain Location Back    Pain Orientation Right    Pain Descriptors / Indicators Burning    Pain Type Chronic pain    Pain Radiating Towards to foot    Pain Onset More than a month ago    Pain Frequency Constant    Aggravating Factors  activity,coughing    Pain Relieving Factors sitting    Effect of Pain on Daily  Activities limits                               [facility] Adult PT Treatment/Exercise - 12/08/20 0001       Exercises   Exercises Lumbar      Lumbar Exercises: Stretches   Active Hamstring Stretch Right;Left;3 reps;30 seconds    Prone on Elbows Stretch 60 seconds;2 reps      Lumbar Exercises: Seated   Other Seated Lumbar Exercises tall posture x 10      Lumbar Exercises: Supine   Other Supine Lumbar Exercises decompression ex 1-5 ;  star abdominal strengthening.      Lumbar Exercises: Prone   Straight Leg Raise 5 reps                      PT Short Term Goals - 12/08/20 1212       PT SHORT TERM GOAL #1   Title Patient will be able to transition from supine to sit without increased pain    Time 4    Period Weeks    Status On-going    Target Date 11/22/20      PT SHORT TERM GOAL #2   Title Patient will report at least 25% improvement in overall symptoms and/or function to demonstrate improved functional mobility    Time 4    Period Weeks    Status On-going    Target Date 11/22/20      PT SHORT TERM GOAL #3   Title Patient will be independent in self management strategies to improve quality of life and functional outcomes.    Time 4    Period Weeks    Status On-going    Target Date 11/22/20               PT Long Term Goals - 12/08/20 1212       PT LONG TERM GOAL #1   Title Patient will report at least 50% improvement in overall symptoms and/or function to demonstrate improved functional mobility    Time 8    Period Weeks    Status On-going      PT LONG TERM GOAL #2   Title Patient will be able to demonstrate at least 50% of functional lumbar ROM    Time 8    Period Weeks    Status On-going      PT LONG TERM GOAL #3   Title Patient will improve on FOTO score to meet predicted outcomes to demonstrate improved functional mobility.    Time 8    Period Weeks    Status  On-going                   Plan - 12/08/20  1151     Clinical Impression Statement PT has not been able to come to therapy on a consistent basis due to having chemo for her cancer.  Pt has improved in her back pain, she is not consistent but is trying to complete her HEP.    Personal Factors and Comorbidities Comorbidity 1;Comorbidity 2;Comorbidity 3+    Comorbidities bladder cancer - active treatments with history of multiple surgeries, COPD    Examination-Activity Limitations Bed Mobility;Squat;Sleep;Sit;Bend;Stairs;Stand;Transfers;Locomotion Level    Examination-Participation Restrictions Yard Work;Community Activity;Cleaning    Stability/Clinical Decision Making Evolving/Moderate complexity    Rehab Potential Fair    PT Frequency 1x / week    PT Duration 8 weeks    PT Treatment/Interventions ADLs/Self Care Home Management;Cryotherapy;Electrical Stimulation;Moist Heat;Traction;Therapeutic exercise;Therapeutic activities;Gait training;Patient/family education;Neuromuscular re-education;Manual techniques;Spinal Manipulations    PT Next Visit Plan pain managements strategies, decreased muscle guard, isoemtrics, gentle ROM    PT Home Exercise Plan glute squeeze, supine butterfly, SKC, LTR, bridge, hip IR; 8/10:  decompression ex, abdominal star exercises.    Consulted and Agree with Plan of Care Patient             Patient will benefit from skilled therapeutic intervention in order to improve the following deficits and impairments:  Decreased activity tolerance, Pain, Decreased mobility, Decreased range of motion, Decreased strength  Visit Diagnosis: Muscle weakness (generalized)  Difficulty in walking, not elsewhere classified     Problem List Patient Active Problem List   Diagnosis Date Noted   HAND, ARTHRITIS, DEGEN./OSTEO 01/18/2009   HEMANGIOMA OF UNSPECIFIED SITE 07/22/2007   CERVICAL SPONDYLOSIS WITHOUT MYELOPATHY 07/22/2007   CERVICALGIA 07/22/2007   SPINAL STENOSIS 07/22/2007  Rayetta Humphrey, PT  CLT [phone]  12/08/2020, 12:13 PM  [place], [place], [address] Phone: [phone]   Fax:  [phone]  Name: Rhonda Cook MRN: [id number] Date of Birth: 1945/06/21

## 2020-12-15 ENCOUNTER — Encounter (HOSPITAL_COMMUNITY): Payer: PPO | Admitting: Physical Therapy

## 2020-12-15 ENCOUNTER — Telehealth (HOSPITAL_COMMUNITY): Payer: Self-pay | Admitting: Physical Therapy

## 2020-12-15 NOTE — Telephone Encounter (Signed)
No Show. Called patient about missed appointment but could not leave VM as VM box was full.   11:20 AM, 12/15/20 Jerene Pitch, DPT Physical Therapy with [facility]  [phone] office

## 2020-12-16 DIAGNOSIS — I10 Essential (primary) hypertension: Secondary | ICD-10-CM | POA: Diagnosis not present

## 2020-12-16 DIAGNOSIS — M1991 Primary osteoarthritis, unspecified site: Secondary | ICD-10-CM | POA: Diagnosis not present

## 2020-12-16 DIAGNOSIS — Z1389 Encounter for screening for other disorder: Secondary | ICD-10-CM | POA: Diagnosis not present

## 2020-12-16 DIAGNOSIS — Z1331 Encounter for screening for depression: Secondary | ICD-10-CM | POA: Diagnosis not present

## 2020-12-16 DIAGNOSIS — Z719 Counseling, unspecified: Secondary | ICD-10-CM | POA: Diagnosis not present

## 2020-12-16 DIAGNOSIS — D1802 Hemangioma of intracranial structures: Secondary | ICD-10-CM | POA: Diagnosis not present

## 2020-12-16 DIAGNOSIS — M810 Age-related osteoporosis without current pathological fracture: Secondary | ICD-10-CM | POA: Diagnosis not present

## 2020-12-16 DIAGNOSIS — C679 Malignant neoplasm of bladder, unspecified: Secondary | ICD-10-CM | POA: Diagnosis not present

## 2020-12-16 DIAGNOSIS — E782 Mixed hyperlipidemia: Secondary | ICD-10-CM | POA: Diagnosis not present

## 2020-12-16 DIAGNOSIS — Z6822 Body mass index (BMI) 22.0-22.9, adult: Secondary | ICD-10-CM | POA: Diagnosis not present

## 2020-12-16 DIAGNOSIS — Z0001 Encounter for general adult medical examination with abnormal findings: Secondary | ICD-10-CM | POA: Diagnosis not present

## 2020-12-29 ENCOUNTER — Ambulatory Visit (HOSPITAL_COMMUNITY): Payer: PPO | Admitting: Physical Therapy

## 2021-01-24 DIAGNOSIS — Z8551 Personal history of malignant neoplasm of bladder: Secondary | ICD-10-CM | POA: Diagnosis not present

## 2021-02-28 DIAGNOSIS — E063 Autoimmune thyroiditis: Secondary | ICD-10-CM | POA: Diagnosis not present

## 2021-02-28 DIAGNOSIS — E782 Mixed hyperlipidemia: Secondary | ICD-10-CM | POA: Diagnosis not present

## 2021-02-28 DIAGNOSIS — I10 Essential (primary) hypertension: Secondary | ICD-10-CM | POA: Diagnosis not present

## 2021-03-10 DIAGNOSIS — Z5111 Encounter for antineoplastic chemotherapy: Secondary | ICD-10-CM | POA: Diagnosis not present

## 2021-03-10 DIAGNOSIS — C672 Malignant neoplasm of lateral wall of bladder: Secondary | ICD-10-CM | POA: Diagnosis not present

## 2021-03-17 DIAGNOSIS — C672 Malignant neoplasm of lateral wall of bladder: Secondary | ICD-10-CM | POA: Diagnosis not present

## 2021-03-17 DIAGNOSIS — Z5111 Encounter for antineoplastic chemotherapy: Secondary | ICD-10-CM | POA: Diagnosis not present

## 2021-03-31 DIAGNOSIS — Z5111 Encounter for antineoplastic chemotherapy: Secondary | ICD-10-CM | POA: Diagnosis not present

## 2021-03-31 DIAGNOSIS — C672 Malignant neoplasm of lateral wall of bladder: Secondary | ICD-10-CM | POA: Diagnosis not present

## 2021-04-26 ENCOUNTER — Encounter (HOSPITAL_COMMUNITY): Admission: RE | Admit: 2021-04-26 | Payer: PPO | Source: Ambulatory Visit

## 2021-05-12 DIAGNOSIS — Z8551 Personal history of malignant neoplasm of bladder: Secondary | ICD-10-CM | POA: Diagnosis not present

## 2021-05-31 DIAGNOSIS — E063 Autoimmune thyroiditis: Secondary | ICD-10-CM | POA: Diagnosis not present

## 2021-05-31 DIAGNOSIS — I10 Essential (primary) hypertension: Secondary | ICD-10-CM | POA: Diagnosis not present

## 2021-05-31 DIAGNOSIS — E7849 Other hyperlipidemia: Secondary | ICD-10-CM | POA: Diagnosis not present

## 2021-06-13 DIAGNOSIS — Z6822 Body mass index (BMI) 22.0-22.9, adult: Secondary | ICD-10-CM | POA: Diagnosis not present

## 2021-06-13 DIAGNOSIS — E663 Overweight: Secondary | ICD-10-CM | POA: Diagnosis not present

## 2021-06-13 DIAGNOSIS — M1611 Unilateral primary osteoarthritis, right hip: Secondary | ICD-10-CM | POA: Diagnosis not present

## 2021-07-11 DIAGNOSIS — M25551 Pain in right hip: Secondary | ICD-10-CM | POA: Diagnosis not present

## 2021-07-19 DIAGNOSIS — M545 Low back pain, unspecified: Secondary | ICD-10-CM | POA: Diagnosis not present

## 2021-07-21 DIAGNOSIS — H52203 Unspecified astigmatism, bilateral: Secondary | ICD-10-CM | POA: Diagnosis not present

## 2021-07-21 DIAGNOSIS — H5213 Myopia, bilateral: Secondary | ICD-10-CM | POA: Diagnosis not present

## 2021-07-21 DIAGNOSIS — Z961 Presence of intraocular lens: Secondary | ICD-10-CM | POA: Diagnosis not present

## 2021-07-21 DIAGNOSIS — H524 Presbyopia: Secondary | ICD-10-CM | POA: Diagnosis not present

## 2021-08-03 DIAGNOSIS — M533 Sacrococcygeal disorders, not elsewhere classified: Secondary | ICD-10-CM | POA: Diagnosis not present

## 2021-08-14 ENCOUNTER — Ambulatory Visit
Admission: EM | Admit: 2021-08-14 | Discharge: 2021-08-14 | Disposition: A | Discharge status: Home / Self Care | Payer: PPO | Attending: Urgent Care | Admitting: Urgent Care

## 2021-08-14 ENCOUNTER — Ambulatory Visit (INDEPENDENT_AMBULATORY_CARE_PROVIDER_SITE_OTHER): Payer: PPO

## 2021-08-14 DIAGNOSIS — M5136 Other intervertebral disc degeneration, lumbar region: Secondary | ICD-10-CM

## 2021-08-14 DIAGNOSIS — W19XXXA Unspecified fall, initial encounter: Secondary | ICD-10-CM

## 2021-08-14 DIAGNOSIS — M25552 Pain in left hip: Secondary | ICD-10-CM

## 2021-08-14 MED ORDER — PREDNISONE 20 MG PO TABS: ORAL_TABLET | ORAL | 0 refills | Status: DC

## 2021-08-14 NOTE — ED Provider Notes (Signed)
?[facility] ? ? ?MRN: [id number] DOB: 1945-09-15 ? ?Subjective:  ? ?Rhonda Cook is a 76 y.o. female presenting for 2-3 week history of persistent left hip pain. Symptoms started after she fell accidentally and her symptoms started thereafter.  She actually fell face down and cannot remember exactly if she hit her left hip at all but everything else has felt fine except for the hip.  She has tried multiple medications including ibuprofen, Tylenol, tramadol and nothing is providing relief.  She does have a history of arthritis, osteoporosis.  A diagnosis of arthritis has not been established for the hip.  No history of diabetes. ? ?No current facility-administered medications for this encounter. ? ?Current Outpatient Medications:  ?  albuterol (PROAIR HFA) 108 (90 Base) MCG/ACT inhaler, Inhale 2 puffs into the lungs every 4 (four) hours as needed for wheezing or shortness of breath. , Disp: , Rfl:  ?  albuterol (PROVENTIL) (2.5 MG/3ML) 0.083% nebulizer solution, Take 2.5 mg by nebulization every 6 (six) hours as needed for wheezing or shortness of breath. , Disp: , Rfl:  ?  aspirin EC 81 MG tablet, Take 1 tablet (81 mg total) by mouth daily., Disp: , Rfl:  ?  AZO-CRANBERRY PO, Take 2 tablets by mouth daily as needed. , Disp: , Rfl:  ?  denosumab (PROLIA) 60 MG/ML SOSY injection, Inject 60 mg into the skin every 6 (six) months. , Disp: , Rfl:  ?  docusate sodium (COLACE) 100 MG capsule, Take 100 mg by mouth at bedtime., Disp: , Rfl:  ?  ibuprofen (ADVIL,MOTRIN) 200 MG tablet, Take 800 mg by mouth daily as needed for headache or moderate pain., Disp: , Rfl:  ?  levothyroxine (SYNTHROID, LEVOTHROID) 75 MCG tablet, Take 75 mcg by mouth daily. , Disp: , Rfl:  ?  lisinopril-hydrochlorothiazide (PRINZIDE,ZESTORETIC) 10-12.5 MG tablet, Take 1 tablet by mouth daily. , Disp: , Rfl:  ?  Probiotic Product (PROBIOTIC-10 PO), Take 1 capsule by mouth daily., Disp: , Rfl:  ?  simvastatin (ZOCOR) 20 MG tablet,  Take 20 mg by mouth at bedtime. , Disp: , Rfl: 0 ?  Tetrahydrozoline HCl (VISINE OP), Place 1 drop into both eyes daily as needed (dry eyes)., Disp: , Rfl:   ? ?Allergies  ?Allergen Reactions  ? Oxycodone Itching and Nausea And Vomiting  ? Adhesive [Tape] Rash  ? Penicillins Nausea And Vomiting and Rash  ?  Did it involve swelling of the face/tongue/throat, SOB, or low BP? no ?Did it involve sudden or severe rash/hives, skin peeling, or any reaction on the inside of your mouth or nose? yes ?Did you need to seek medical attention at a hospital or doctor's office? no ?When did it last happen?  20 years ago     ?If all above answers are "NO", may proceed with cephalosporin use.  ? ? ?Past Medical History:  ?Diagnosis Date  ? Arthritis   ? Autoimmune thyroiditis   ? followed by pcp  ? Bladder cancer ([place])   ? urologist-- dr winter---  s/p TURBT 05-31-2018; 07-23-2018  ? Chronic constipation   ? Chronically dry eyes   ? COPD (chronic obstructive pulmonary disease) ([place]) (09-11-2019 denies sob or difficulty breathing,  no URI in past month)  ? followed by pcp--- per pt never used oxygen, has rescue inhaler and nebulizer prn  ? Diverticulosis of colon   ? History of adenomatous polyp of colon 08/ 16/ 2017  ? History of recurrent UTIs   ? Hyperlipidemia   ?  Hypertension   ? followed by pcp   (09-11-2019 pt stated never had a stress test)  ? Hypothyroidism   ? Nocturia   ? Osteoporosis   ? PONV (postoperative nausea and vomiting)   ? slow to awaken, shakes if under too long  ?  ? ?Past Surgical History:  ?Procedure Laterality Date  ? COLONOSCOPY N/A 12/15/2015  ? Procedure: COLONOSCOPY;  Surgeon: Rogene Houston, MD;  [facility];  Service: Endoscopy;  Laterality: N/A;  730 - moved to 8/16 @ 7:30  ? CYSTOSCOPY WITH BIOPSY N/A 07/23/2018  ? Procedure: CYSTOSCOPY WITH BLADDER BIOPSY;  Surgeon: Ceasar Mons, MD;  [facility];  Service: Urology;  Laterality: N/A;  ? CYSTOSCOPY WITH BIOPSY N/A  09/16/2019  ? Procedure: CYSTOSCOPY WITH BLADDER BIOPSY/ FULGURATION/ AND  INSTILLATION OF GEMCITABINE IN PACU;  Surgeon: Ceasar Mons, MD;  Location: [facility];  Service: Urology;  Laterality: N/A;  ? FOOT SURGERY Right 2018  ? LAPAROSCOPIC CHOLECYSTECTOMY  2000  ? TRANSURETHRAL RESECTION OF BLADDER TUMOR N/A 05/31/2018  ? Procedure: TRANSURETHRAL RESECTION OF BLADDER TUMOR (TURBT) WITH CYSTOSCOPY, INSTILLATION OF GEMCITABINE IN PACU;  Surgeon: Ceasar Mons, MD;  Location: [facility];  Service: Urology;  Laterality: N/A;  ? ? ?History reviewed. No pertinent family history. ? ?Social History  ? ?Tobacco Use  ? Smoking status: Every Day  ?  Packs/day: 0.25  ?  Types: Cigarettes  ? Smokeless tobacco: Never  ? Tobacco comments:  ?  09-11-2019  per pt trying to quit,  down to 1/2 pp3d  ?Vaping Use  ? Vaping Use: Never used  ?Substance Use Topics  ? Alcohol use: No  ? Drug use: Never  ? ? ?ROS ? ? ?Objective:  ? ?Vitals: ?BP (!) 153/86 (BP Location: Right Arm)   Pulse 83   Temp 97.7 ?F (36.5 ?C) (Oral)   Resp 18   SpO2 96%  ? ?Physical Exam ?Constitutional:   ?   General: She is not in acute distress. ?   Appearance: Normal appearance. She is well-developed. She is not ill-appearing, toxic-appearing or diaphoretic.  ?HENT:  ?   Head: Normocephalic and atraumatic.  ?   Nose: Nose normal.  ?   Mouth/Throat:  ?   Mouth: Mucous membranes are moist.  ?Eyes:  ?   General: No scleral icterus.    ?   Right eye: No discharge.     ?   Left eye: No discharge.  ?   Extraocular Movements: Extraocular movements intact.  ?Cardiovascular:  ?   Rate and Rhythm: Normal rate.  ?Pulmonary:  ?   Effort: Pulmonary effort is normal.  ?Musculoskeletal:  ?   Left hip: Tenderness (Throughout worst over the anterior lateral portion) and bony tenderness present. No deformity, lacerations or crepitus. Normal range of motion.  ?   Comments: Movement pain with internal and external rotation  of the left hip.  ?Skin: ?   General: Skin is warm and dry.  ?Neurological:  ?   General: No focal deficit present.  ?   Mental Status: She is alert and oriented to person, place, and time.  ?Psychiatric:     ?   Mood and Affect: Mood normal.     ?   Behavior: Behavior normal.  ? ? ?DG Hip Unilat With Pelvis 2-3 Views Left ? ?Result Date: 08/14/2021 ?CLINICAL DATA:  Fall 3 weeks ago.  Persistent left hip pain. EXAM: DG HIP (WITH OR  WITHOUT PELVIS) 2-3V LEFT COMPARISON:  None. FINDINGS: There is no evidence of hip fracture or dislocation. There is no evidence of arthropathy or other focal bone abnormality. IMPRESSION: Negative. Electronically Signed   By: Marlaine Hind M.D.   On: 08/14/2021 10:38   ? ? ?Assessment and Plan :  ? ?PDMP not reviewed this encounter. ? ?1. Left hip pain   ?2. Accidental fall, initial encounter   ?3. Degenerative disc disease, lumbar   ? ?X-rays completely negative.  However patient does have a history of spinal stenosis, degenerative disc disease and has had to work with a spine specialist.  Has undergone 2 injections to her back but is not helping.  Plan is to go back and see them again.  For now, I offered patient an oral prednisone course as I suspect she may have radicular symptoms from her spinal stenosis and degenerative disc disease of the lumbar region.  Patient was agreeable to this.  Keep follow-up with the back specialist and orthopedist. Counseled patient on potential for adverse effects with medications prescribed/recommended today, ER and return-to-clinic precautions discussed, patient verbalized understanding. ? ?  ?Jaynee Eagles, PA-C ?08/14/21 1047 ? ?

## 2021-08-14 NOTE — ED Triage Notes (Signed)
Pt reports she fell facing down on the floor in her house 2-3 weeks af hit the hip. Pt reports soreness, burning sensation and shooting pain in the left and left hip. Pain is worse when walking. Tramadol and ibuprofen gives no relief.  ?

## 2021-08-28 DIAGNOSIS — E782 Mixed hyperlipidemia: Secondary | ICD-10-CM | POA: Diagnosis not present

## 2021-08-28 DIAGNOSIS — E063 Autoimmune thyroiditis: Secondary | ICD-10-CM | POA: Diagnosis not present

## 2021-08-28 DIAGNOSIS — I10 Essential (primary) hypertension: Secondary | ICD-10-CM | POA: Diagnosis not present

## 2021-09-06 DIAGNOSIS — M5416 Radiculopathy, lumbar region: Secondary | ICD-10-CM | POA: Diagnosis not present

## 2021-09-19 DIAGNOSIS — M5416 Radiculopathy, lumbar region: Secondary | ICD-10-CM | POA: Diagnosis not present

## 2021-09-21 DIAGNOSIS — N390 Urinary tract infection, site not specified: Secondary | ICD-10-CM | POA: Diagnosis not present

## 2021-09-21 DIAGNOSIS — Z6822 Body mass index (BMI) 22.0-22.9, adult: Secondary | ICD-10-CM | POA: Diagnosis not present

## 2021-10-06 DIAGNOSIS — M5416 Radiculopathy, lumbar region: Secondary | ICD-10-CM | POA: Diagnosis not present

## 2021-10-10 ENCOUNTER — Emergency Department (HOSPITAL_COMMUNITY): Payer: PPO

## 2021-10-10 ENCOUNTER — Telehealth (HOSPITAL_COMMUNITY): Payer: Self-pay | Admitting: Emergency Medicine

## 2021-10-10 ENCOUNTER — Emergency Department (HOSPITAL_COMMUNITY)
Admission: EM | Admit: 2021-10-10 | Discharge: 2021-10-10 | Disposition: A | Discharge status: Home / Self Care | Payer: PPO | Attending: Emergency Medicine | Admitting: Emergency Medicine

## 2021-10-10 ENCOUNTER — Other Ambulatory Visit: Payer: Self-pay

## 2021-10-10 ENCOUNTER — Encounter (HOSPITAL_COMMUNITY): Payer: Self-pay | Admitting: Emergency Medicine

## 2021-10-10 DIAGNOSIS — M545 Low back pain, unspecified: Secondary | ICD-10-CM | POA: Diagnosis not present

## 2021-10-10 DIAGNOSIS — J449 Chronic obstructive pulmonary disease, unspecified: Secondary | ICD-10-CM | POA: Insufficient documentation

## 2021-10-10 DIAGNOSIS — Z7982 Long term (current) use of aspirin: Secondary | ICD-10-CM | POA: Diagnosis not present

## 2021-10-10 DIAGNOSIS — E039 Hypothyroidism, unspecified: Secondary | ICD-10-CM | POA: Diagnosis not present

## 2021-10-10 DIAGNOSIS — M5442 Lumbago with sciatica, left side: Secondary | ICD-10-CM | POA: Diagnosis not present

## 2021-10-10 DIAGNOSIS — Z7952 Long term (current) use of systemic steroids: Secondary | ICD-10-CM | POA: Insufficient documentation

## 2021-10-10 DIAGNOSIS — Z79899 Other long term (current) drug therapy: Secondary | ICD-10-CM | POA: Insufficient documentation

## 2021-10-10 DIAGNOSIS — I10 Essential (primary) hypertension: Secondary | ICD-10-CM | POA: Insufficient documentation

## 2021-10-10 LAB — URINALYSIS, ROUTINE W REFLEX MICROSCOPIC
Bacteria, UA: NONE SEEN
Bilirubin Urine: NEGATIVE
Glucose, UA: NEGATIVE mg/dL
Hgb urine dipstick: NEGATIVE
Ketones, ur: NEGATIVE mg/dL
Nitrite: NEGATIVE
Protein, ur: NEGATIVE mg/dL
Specific Gravity, Urine: 1.013 (ref 1.005–1.030)
pH: 6 (ref 5.0–8.0)

## 2021-10-10 LAB — CBC WITH DIFFERENTIAL/PLATELET
Abs Immature Granulocytes: 0.04 10*3/uL (ref 0.00–0.07)
Basophils Absolute: 0.1 10*3/uL (ref 0.0–0.1)
Basophils Relative: 1 %
Eosinophils Absolute: 0.2 10*3/uL (ref 0.0–0.5)
Eosinophils Relative: 2 %
HCT: 38.1 % (ref 36.0–46.0)
Hemoglobin: 12.9 g/dL (ref 12.0–15.0)
Immature Granulocytes: 1 %
Lymphocytes Relative: 5 %
Lymphs Abs: 0.4 10*3/uL — ABNORMAL LOW (ref 0.7–4.0)
MCH: 31.2 pg (ref 26.0–34.0)
MCHC: 33.9 g/dL (ref 30.0–36.0)
MCV: 92 fL (ref 80.0–100.0)
Monocytes Absolute: 0.6 10*3/uL (ref 0.1–1.0)
Monocytes Relative: 7 %
Neutro Abs: 7.1 10*3/uL (ref 1.7–7.7)
Neutrophils Relative %: 84 %
Platelets: 400 10*3/uL (ref 150–400)
RBC: 4.14 MIL/uL (ref 3.87–5.11)
RDW: 13.3 % (ref 11.5–15.5)
WBC: 8.4 10*3/uL (ref 4.0–10.5)
nRBC: 0 % (ref 0.0–0.2)

## 2021-10-10 LAB — BASIC METABOLIC PANEL
Anion gap: 10 (ref 5–15)
BUN: 12 mg/dL (ref 8–23)
CO2: 26 mmol/L (ref 22–32)
Calcium: 9.3 mg/dL (ref 8.9–10.3)
Chloride: 97 mmol/L — ABNORMAL LOW (ref 98–111)
Creatinine, Ser: 0.62 mg/dL (ref 0.44–1.00)
GFR, Estimated: >60 mL/min (ref >60)
Glucose, Bld: 116 mg/dL — ABNORMAL HIGH (ref 70–99)
Potassium: 3.4 mmol/L — ABNORMAL LOW (ref 3.5–5.1)
Sodium: 133 mmol/L — ABNORMAL LOW (ref 135–145)

## 2021-10-10 MED ORDER — ONDANSETRON HCL 4 MG PO TABS: 4.0000 mg | ORAL_TABLET | Freq: Four times a day (QID) | ORAL | 0 refills | Status: AC

## 2021-10-10 MED ORDER — ONDANSETRON HCL 4 MG PO TABS: 4.0000 mg | ORAL_TABLET | Freq: Four times a day (QID) | ORAL | 0 refills | Status: DC

## 2021-10-10 MED ORDER — FENTANYL CITRATE PF 50 MCG/ML IJ SOSY
25.0000 ug | PREFILLED_SYRINGE | Freq: Once | INTRAMUSCULAR | Status: AC
  Administered 2021-10-10: 25 ug via INTRAVENOUS
  Filled 2021-10-10: qty 1

## 2021-10-10 MED ORDER — ONDANSETRON HCL 4 MG/2ML IJ SOLN
4.0000 mg | Freq: Once | INTRAMUSCULAR | Status: AC
  Administered 2021-10-10: 4 mg via INTRAVENOUS
  Filled 2021-10-10: qty 2

## 2021-10-10 MED ORDER — OXYCODONE-ACETAMINOPHEN 5-325 MG PO TABS: 1.0000 | ORAL_TABLET | ORAL | 0 refills | Status: DC | PRN

## 2021-10-10 NOTE — ED Notes (Signed)
Also states that she is being treated for bladder cancer; (treatment goes directly into bladder) Pt has had 3 treatments

## 2021-10-10 NOTE — ED Notes (Signed)
Pt states that the meds she was given for the pain in her leg- oxycodone- makes her nauseated and vomits it up, so unable to take it. She takes tramadol and ibuprofen, but it is not touching the pain in her leg. She has received 2 steroid injections and 1 nerve block, but the pain is unbearable. She has used ice and heat and it does not help. Can't hardly eat or sleep due to the pain

## 2021-10-10 NOTE — ED Triage Notes (Signed)
Pt to the ED with leg and back pain and has been receiving bone density shots. Pt states the pain is unbearable and she cannot wait until her appointment near the end of the month.

## 2021-10-10 NOTE — ED Provider Notes (Signed)
[facility] EMERGENCY DEPARTMENT Provider Note   CSN: [id number] Arrival date & time: 10/10/21  0745     History  Chief Complaint  Patient presents with   Leg Pain    Rhonda Cook is a 76 y.o. female.   Leg Pain Associated symptoms: back pain   Associated symptoms: no fever and no neck pain        Rhonda Cook is a 76 y.o. female with past medical history significant for hypothyroidism, COPD, hyperlipidemia, low back pain and hypertension who presents to the Emergency Department complaining of worsening pain of her left lower back and pain radiating into her left leg.  She states that she is under the care of neurosurgery and a physiatrist.  She received a "nerve block" of her right lower back 1 month ago.  Symptoms of her right back and right leg have improved since having the nerve block.  She is continued to have pain of the left side since the nerve block.  She has an appointment to see her physiatrist later this month, she was prescribed 10 mg hydrocodone's and tramadol for her pain.  States that she cannot tolerate the hydrocodone as it causes nausea and vomiting.  Tramadol is not helping with her pain.  She describes a sharp stabbing pain that radiates from her left lower back into her buttock and down her leg.  Pain intermittently travels to the level of her foot.  She denies any weakness or numbness of her leg, abdominal pain, urine or bowel changes, fever or chills.  Pain is constant and unrelieved with position change or at rest.  She denies any new injury.   Home Medications Prior to Admission medications   Medication Sig Start Date End Date Taking? Authorizing Provider  albuterol (PROAIR HFA) 108 (90 Base) MCG/ACT inhaler Inhale 2 puffs into the lungs every 4 (four) hours as needed for wheezing or shortness of breath.  03/26/17   [provider]  albuterol (PROVENTIL) (2.5 MG/3ML) 0.083% nebulizer solution Take 2.5 mg by nebulization every 6 (six) hours as  needed for wheezing or shortness of breath.  03/09/17   [provider]  aspirin EC 81 MG tablet Take 1 tablet (81 mg total) by mouth daily. 12/23/15   Rehman, Mechele Dawley, MD  AZO-CRANBERRY PO Take 2 tablets by mouth daily as needed.     [provider]  celecoxib (CELEBREX) 200 MG capsule Take 200 mg by mouth 2 (two) times daily. 09/20/21   [provider]  denosumab (PROLIA) 60 MG/ML SOSY injection Inject 60 mg into the skin every 6 (six) months.     [provider]  docusate sodium (COLACE) 100 MG capsule Take 100 mg by mouth at bedtime.    [provider]  famotidine (PEPCID) 40 MG tablet Take 1 tablet by mouth daily. 09/20/21   [provider]  gabapentin (NEURONTIN) 300 MG capsule Take 300 mg by mouth at bedtime. 09/20/21   [provider]  HYDROcodone-acetaminophen (NORCO) 10-325 MG tablet Take 1 tablet by mouth 3 (three) times daily as needed. 10/07/21   [provider]  ibuprofen (ADVIL,MOTRIN) 200 MG tablet Take 800 mg by mouth daily as needed for headache or moderate pain.    [provider]  levothyroxine (SYNTHROID, LEVOTHROID) 75 MCG tablet Take 75 mcg by mouth daily.  04/18/18   [provider]  lisinopril-hydrochlorothiazide (PRINZIDE,ZESTORETIC) 10-12.5 MG tablet Take 1 tablet by mouth daily.  04/06/18   [provider]  predniSONE (DELTASONE) 20 MG tablet Take 2 tablets daily with breakfast. 08/14/21   Jaynee Eagles, PA-C  Probiotic Product (PROBIOTIC-10 PO) Take 1 capsule by mouth daily.    [provider]  simvastatin (ZOCOR) 20 MG tablet Take 20 mg by mouth at bedtime.  11/29/15   [provider]  Tetrahydrozoline HCl (VISINE OP) Place 1 drop into both eyes daily as needed (dry eyes).    [provider]  traMADol (ULTRAM) 50 MG tablet Take 50 mg by mouth 4 (four) times daily as needed. 10/07/21   [provider]      Allergies    Oxycodone, Adhesive [tape],  and Penicillins    Review of Systems   Review of Systems  Constitutional:  Positive for appetite change. Negative for chills and fever.  Eyes:  Negative for visual disturbance.  Respiratory:  Negative for shortness of breath.   Cardiovascular:  Negative for chest pain.  Gastrointestinal:  Negative for abdominal pain, nausea and vomiting.  Genitourinary:  Negative for decreased urine volume, difficulty urinating, dysuria and urgency.  Musculoskeletal:  Positive for back pain. Negative for joint swelling and neck pain.  Skin:  Negative for rash and wound.  Neurological:  Positive for headaches. Negative for dizziness, syncope, weakness and numbness.    Physical Exam Updated Vital Signs BP (!) 130/59   Pulse 88   Temp 97.8 F (36.6 C) (Oral)   Resp 16   Ht 5\' 3"  (1.6 m)   Wt 59 kg   SpO2 95%   BMI 23.03 kg/m  Physical Exam Vitals and nursing note reviewed.  Constitutional:      Appearance: Normal appearance.     Comments: She is uncomfortable appearing, nontoxic  HENT:     Mouth/Throat:     Mouth: Mucous membranes are moist.  Cardiovascular:     Rate and Rhythm: Normal rate and regular rhythm.     Pulses: Normal pulses.  Pulmonary:     Effort: Pulmonary effort is normal.     Breath sounds: Normal breath sounds.  Chest:     Chest wall: No tenderness.  Abdominal:     Palpations: Abdomen is soft.     Tenderness: There is no abdominal tenderness.  Musculoskeletal:        General: Tenderness present. No swelling or signs of injury.     Cervical back: Normal range of motion. No rigidity or tenderness.     Lumbar back: Tenderness and bony tenderness present. No swelling or deformity. Normal range of motion. Positive left straight leg raise test.     Comments: Tender to palpation along the lower lumbar spine and left lumbar paraspinal muscles.  Pain reproduced with straight leg raise on the left.  Hip flexors extensors intact.  Skin:    General: Skin is warm.     Capillary  Refill: Capillary refill takes less than 2 seconds.     Findings: No rash.  Neurological:     General: No focal deficit present.     Mental Status: She is alert.     Sensory: No sensory deficit.     Motor: No weakness.     ED Results / Procedures / Treatments   Labs (all labs ordered are listed, but only abnormal results are displayed) Labs Reviewed  CBC WITH DIFFERENTIAL/PLATELET - Abnormal; Notable for the following components:      Result Value   Lymphs Abs 0.4 (*)    All other components within normal limits  BASIC METABOLIC PANEL -  Abnormal; Notable for the following components:   Sodium 133 (*)    Potassium 3.4 (*)    Chloride 97 (*)    Glucose, Bld 116 (*)    All other components within normal limits  URINALYSIS, ROUTINE W REFLEX MICROSCOPIC - Abnormal; Notable for the following components:   Leukocytes,Ua TRACE (*)    All other components within normal limits    EKG None  Radiology DG Lumbar Spine Complete  Result Date: 10/10/2021 CLINICAL DATA:  Left low back pain EXAM: LUMBAR SPINE - COMPLETE 4 VIEW COMPARISON:  Lumbar spine radiograph dated July 08, 2020 FINDINGS: Vertebral body heights are well-maintained. Mild-to-moderate multilevel degenerative disc disease, most pronounced at L2-L3 where there is disc space height loss. Moderate facet arthropathy of the lower lumbar spine. Aortoiliac atherosclerotic calcifications. IMPRESSION: Mild-to-moderate degenerative disc disease, unchanged when compared to prior exam. Electronically Signed   By: Yetta Glassman M.D.   On: 10/10/2021 10:55    Procedures Procedures    Medications Ordered in ED Medications - No data to display  ED Course/ Medical Decision Making/ A&P                           Medical Decision Making Patient with history of low back pain, under the care of of neurosurgery and physiatry.  Had nerve block 1 month ago.  No recent injury, had improvement of right-sided symptoms after the nerve block but  continued to have left-sided symptoms that have worsened.  She has been unable to tolerate prescribed pain medication.  On exam, patient uncomfortable appearing but nontoxic.  Vital signs reviewed viewed.  She has tenderness of the lower lumbar spine and left paraspinal muscles.  No focal neurodeficits on exam and patient denies any urine/bowel incontinence or retention.  Differential diagnosis would include cauda equina, sciatica, spinal abscess.  Will obtain labs and imaging and pain control.  Amount and/or Complexity of Data Reviewed Labs: ordered.    Details: Labs interpreted by me, no evidence of leukocytosis, urinalysis without evidence of infection hemoglobin unremarkable.  Chemistries without significant derangement. Radiology: ordered.    Details: L-spine Film shows mild to moderate degenerative disc disease most pronounced at L2 and 3.  This is unchanged from prior exam. Discussion of management or test interpretation with external provider(s): Patient here with likely acute on chronic pain.  No red flags on exam to suggest cauda equina or spinal abscess.  Patient is given pain medication here and symptoms have improved.  She is requesting to go home.  She is ambulated in the department with steady gait.  She did not require assistance. We will have her discontinue her 10 mg hydrocodone and tramadol.  Will prescribe short course of 5 mg Percocet and she will use over-the-counter 4% lidocaine patches to the affected area.  She is agreeable to close follow-up with her neurosurgery group.    Risk Prescription drug management.           Final Clinical Impression(s) / ED Diagnoses Final diagnoses:  Acute left-sided low back pain with left-sided sciatica    Rx / DC Orders ED Discharge Orders     None         Kem Parkinson, PA-C 10/10/21 1155    Godfrey Pick, MD 10/12/21 0840

## 2021-10-10 NOTE — Discharge Instructions (Signed)
Stop taking the hydrocodone and the tramadol.  Take the Percocet as directed.  You may take the Zofran 20 to 30 minutes before your pain medication.  Also, try using a over-the-counter 4% lidocaine patch as directed and apply to the affected area of your back.  Follow-up with your neurosurgeon for recheck.  Return emergency department for any new or worsening symptoms.

## 2021-10-10 NOTE — Telephone Encounter (Signed)
Patient was prescribed Zofran upon hospital discharge.  Prescription was sent to patient's pharmacy of choice.  She called back later stating that medication was too expensive and was not covered by her insurance plan.  Prescription sent to [facility] and patient will use good Rx to fill prescription.

## 2021-10-10 NOTE — ED Notes (Signed)
Dc instructions and scripts reviewed with pt no questions or concerns at this time. Will follow up with pcp and neurosurgeon.

## 2021-10-12 DIAGNOSIS — Z5111 Encounter for antineoplastic chemotherapy: Secondary | ICD-10-CM | POA: Diagnosis not present

## 2021-10-12 DIAGNOSIS — C672 Malignant neoplasm of lateral wall of bladder: Secondary | ICD-10-CM | POA: Diagnosis not present

## 2021-10-13 DIAGNOSIS — M5416 Radiculopathy, lumbar region: Secondary | ICD-10-CM | POA: Diagnosis not present

## 2021-10-19 ENCOUNTER — Other Ambulatory Visit: Payer: Self-pay

## 2021-10-19 ENCOUNTER — Inpatient Hospital Stay (HOSPITAL_COMMUNITY): Payer: PPO

## 2021-10-19 ENCOUNTER — Inpatient Hospital Stay (HOSPITAL_COMMUNITY)
Admission: EM | Admit: 2021-10-19 | Discharge: 2021-10-30 | DRG: 180 | Disposition: A | Discharge status: Home Health | Payer: PPO | Attending: Internal Medicine | Admitting: Internal Medicine

## 2021-10-19 ENCOUNTER — Encounter (HOSPITAL_COMMUNITY): Payer: Self-pay

## 2021-10-19 ENCOUNTER — Emergency Department (HOSPITAL_COMMUNITY): Payer: PPO

## 2021-10-19 DIAGNOSIS — I10 Essential (primary) hypertension: Secondary | ICD-10-CM | POA: Diagnosis not present

## 2021-10-19 DIAGNOSIS — C771 Secondary and unspecified malignant neoplasm of intrathoracic lymph nodes: Secondary | ICD-10-CM | POA: Diagnosis present

## 2021-10-19 DIAGNOSIS — J910 Malignant pleural effusion: Secondary | ICD-10-CM | POA: Diagnosis present

## 2021-10-19 DIAGNOSIS — Z978 Presence of other specified devices: Secondary | ICD-10-CM | POA: Diagnosis not present

## 2021-10-19 DIAGNOSIS — Z9049 Acquired absence of other specified parts of digestive tract: Secondary | ICD-10-CM | POA: Diagnosis not present

## 2021-10-19 DIAGNOSIS — Z79890 Hormone replacement therapy (postmenopausal): Secondary | ICD-10-CM | POA: Diagnosis not present

## 2021-10-19 DIAGNOSIS — R911 Solitary pulmonary nodule: Secondary | ICD-10-CM | POA: Diagnosis not present

## 2021-10-19 DIAGNOSIS — J90 Pleural effusion, not elsewhere classified: Principal | ICD-10-CM | POA: Diagnosis present

## 2021-10-19 DIAGNOSIS — C3490 Malignant neoplasm of unspecified part of unspecified bronchus or lung: Secondary | ICD-10-CM | POA: Diagnosis not present

## 2021-10-19 DIAGNOSIS — C3492 Malignant neoplasm of unspecified part of left bronchus or lung: Principal | ICD-10-CM | POA: Diagnosis present

## 2021-10-19 DIAGNOSIS — M7989 Other specified soft tissue disorders: Secondary | ICD-10-CM | POA: Diagnosis not present

## 2021-10-19 DIAGNOSIS — J439 Emphysema, unspecified: Secondary | ICD-10-CM | POA: Diagnosis not present

## 2021-10-19 DIAGNOSIS — C679 Malignant neoplasm of bladder, unspecified: Secondary | ICD-10-CM | POA: Diagnosis present

## 2021-10-19 DIAGNOSIS — R0902 Hypoxemia: Secondary | ICD-10-CM | POA: Diagnosis present

## 2021-10-19 DIAGNOSIS — Z7189 Other specified counseling: Secondary | ICD-10-CM | POA: Diagnosis not present

## 2021-10-19 DIAGNOSIS — K219 Gastro-esophageal reflux disease without esophagitis: Secondary | ICD-10-CM | POA: Diagnosis present

## 2021-10-19 DIAGNOSIS — R188 Other ascites: Secondary | ICD-10-CM | POA: Diagnosis not present

## 2021-10-19 DIAGNOSIS — R590 Localized enlarged lymph nodes: Secondary | ICD-10-CM | POA: Diagnosis not present

## 2021-10-19 DIAGNOSIS — R0602 Shortness of breath: Secondary | ICD-10-CM | POA: Diagnosis not present

## 2021-10-19 DIAGNOSIS — R918 Other nonspecific abnormal finding of lung field: Secondary | ICD-10-CM | POA: Diagnosis not present

## 2021-10-19 DIAGNOSIS — R0600 Dyspnea, unspecified: Secondary | ICD-10-CM | POA: Diagnosis present

## 2021-10-19 DIAGNOSIS — F419 Anxiety disorder, unspecified: Secondary | ICD-10-CM | POA: Diagnosis present

## 2021-10-19 DIAGNOSIS — C779 Secondary and unspecified malignant neoplasm of lymph node, unspecified: Secondary | ICD-10-CM | POA: Diagnosis not present

## 2021-10-19 DIAGNOSIS — R059 Cough, unspecified: Secondary | ICD-10-CM | POA: Diagnosis not present

## 2021-10-19 DIAGNOSIS — Z87891 Personal history of nicotine dependence: Secondary | ICD-10-CM

## 2021-10-19 DIAGNOSIS — Z66 Do not resuscitate: Secondary | ICD-10-CM | POA: Diagnosis not present

## 2021-10-19 DIAGNOSIS — E43 Unspecified severe protein-calorie malnutrition: Secondary | ICD-10-CM | POA: Diagnosis present

## 2021-10-19 DIAGNOSIS — J441 Chronic obstructive pulmonary disease with (acute) exacerbation: Secondary | ICD-10-CM | POA: Diagnosis present

## 2021-10-19 DIAGNOSIS — Z86010 Personal history of colonic polyps: Secondary | ICD-10-CM | POA: Diagnosis not present

## 2021-10-19 DIAGNOSIS — Z515 Encounter for palliative care: Secondary | ICD-10-CM

## 2021-10-19 DIAGNOSIS — Z20822 Contact with and (suspected) exposure to covid-19: Secondary | ICD-10-CM | POA: Diagnosis not present

## 2021-10-19 DIAGNOSIS — Z6823 Body mass index (BMI) 23.0-23.9, adult: Secondary | ICD-10-CM

## 2021-10-19 DIAGNOSIS — J9811 Atelectasis: Secondary | ICD-10-CM | POA: Diagnosis not present

## 2021-10-19 DIAGNOSIS — J449 Chronic obstructive pulmonary disease, unspecified: Secondary | ICD-10-CM | POA: Diagnosis not present

## 2021-10-19 DIAGNOSIS — M810 Age-related osteoporosis without current pathological fracture: Secondary | ICD-10-CM | POA: Diagnosis present

## 2021-10-19 DIAGNOSIS — R599 Enlarged lymph nodes, unspecified: Secondary | ICD-10-CM | POA: Diagnosis not present

## 2021-10-19 DIAGNOSIS — Z7982 Long term (current) use of aspirin: Secondary | ICD-10-CM | POA: Diagnosis not present

## 2021-10-19 DIAGNOSIS — R0781 Pleurodynia: Secondary | ICD-10-CM | POA: Diagnosis not present

## 2021-10-19 DIAGNOSIS — N3289 Other specified disorders of bladder: Secondary | ICD-10-CM | POA: Diagnosis present

## 2021-10-19 DIAGNOSIS — Z885 Allergy status to narcotic agent status: Secondary | ICD-10-CM

## 2021-10-19 DIAGNOSIS — Z880 Allergy status to penicillin: Secondary | ICD-10-CM

## 2021-10-19 DIAGNOSIS — Z79899 Other long term (current) drug therapy: Secondary | ICD-10-CM | POA: Diagnosis not present

## 2021-10-19 DIAGNOSIS — Z87440 Personal history of urinary (tract) infections: Secondary | ICD-10-CM

## 2021-10-19 DIAGNOSIS — E785 Hyperlipidemia, unspecified: Secondary | ICD-10-CM | POA: Diagnosis not present

## 2021-10-19 DIAGNOSIS — E440 Moderate protein-calorie malnutrition: Secondary | ICD-10-CM | POA: Diagnosis present

## 2021-10-19 DIAGNOSIS — J939 Pneumothorax, unspecified: Secondary | ICD-10-CM | POA: Diagnosis not present

## 2021-10-19 DIAGNOSIS — C801 Malignant (primary) neoplasm, unspecified: Secondary | ICD-10-CM | POA: Diagnosis not present

## 2021-10-19 DIAGNOSIS — R846 Abnormal cytological findings in specimens from respiratory organs and thorax: Secondary | ICD-10-CM | POA: Diagnosis not present

## 2021-10-19 DIAGNOSIS — R301 Vesical tenesmus: Secondary | ICD-10-CM | POA: Diagnosis not present

## 2021-10-19 DIAGNOSIS — R000 Tachycardia, unspecified: Secondary | ICD-10-CM | POA: Diagnosis not present

## 2021-10-19 DIAGNOSIS — M549 Dorsalgia, unspecified: Secondary | ICD-10-CM | POA: Diagnosis not present

## 2021-10-19 DIAGNOSIS — R0603 Acute respiratory distress: Secondary | ICD-10-CM | POA: Diagnosis present

## 2021-10-19 DIAGNOSIS — R54 Age-related physical debility: Secondary | ICD-10-CM | POA: Diagnosis present

## 2021-10-19 DIAGNOSIS — M1990 Unspecified osteoarthritis, unspecified site: Secondary | ICD-10-CM | POA: Diagnosis not present

## 2021-10-19 LAB — CBC WITH DIFFERENTIAL/PLATELET
Abs Immature Granulocytes: 0.04 10*3/uL (ref 0.00–0.07)
Basophils Absolute: 0 10*3/uL (ref 0.0–0.1)
Basophils Relative: 0 %
Eosinophils Absolute: 0.3 10*3/uL (ref 0.0–0.5)
Eosinophils Relative: 3 %
HCT: 36.1 % (ref 36.0–46.0)
Hemoglobin: 12.1 g/dL (ref 12.0–15.0)
Immature Granulocytes: 1 %
Lymphocytes Relative: 5 %
Lymphs Abs: 0.4 10*3/uL — ABNORMAL LOW (ref 0.7–4.0)
MCH: 31 pg (ref 26.0–34.0)
MCHC: 33.5 g/dL (ref 30.0–36.0)
MCV: 92.6 fL (ref 80.0–100.0)
Monocytes Absolute: 0.7 10*3/uL (ref 0.1–1.0)
Monocytes Relative: 8 %
Neutro Abs: 7 10*3/uL (ref 1.7–7.7)
Neutrophils Relative %: 83 %
Platelets: 384 10*3/uL (ref 150–400)
RBC: 3.9 MIL/uL (ref 3.87–5.11)
RDW: 13.5 % (ref 11.5–15.5)
WBC: 8.4 10*3/uL (ref 4.0–10.5)
nRBC: 0 % (ref 0.0–0.2)

## 2021-10-19 LAB — URINALYSIS, ROUTINE W REFLEX MICROSCOPIC
Bilirubin Urine: NEGATIVE
Glucose, UA: NEGATIVE mg/dL
Hgb urine dipstick: NEGATIVE
Ketones, ur: 20 mg/dL — AB
Leukocytes,Ua: NEGATIVE
Nitrite: NEGATIVE
Protein, ur: NEGATIVE mg/dL
Specific Gravity, Urine: 1.043 — ABNORMAL HIGH (ref 1.005–1.030)
pH: 7 (ref 5.0–8.0)

## 2021-10-19 LAB — BASIC METABOLIC PANEL
Anion gap: 11 (ref 5–15)
BUN: 12 mg/dL (ref 8–23)
CO2: 25 mmol/L (ref 22–32)
Calcium: 8.7 mg/dL — ABNORMAL LOW (ref 8.9–10.3)
Chloride: 100 mmol/L (ref 98–111)
Creatinine, Ser: 0.49 mg/dL (ref 0.44–1.00)
GFR, Estimated: >60 mL/min (ref >60)
Glucose, Bld: 111 mg/dL — ABNORMAL HIGH (ref 70–99)
Potassium: 3.6 mmol/L (ref 3.5–5.1)
Sodium: 136 mmol/L (ref 135–145)

## 2021-10-19 LAB — LACTIC ACID, PLASMA: Lactic Acid, Venous: 1.1 mmol/L (ref 0.5–1.9)

## 2021-10-19 LAB — RESP PANEL BY RT-PCR (FLU A&B, COVID) ARPGX2
Influenza A by PCR: NEGATIVE
Influenza B by PCR: NEGATIVE
SARS Coronavirus 2 by RT PCR: NEGATIVE

## 2021-10-19 MED ORDER — DOCUSATE SODIUM 100 MG PO CAPS
100.0000 mg | ORAL_CAPSULE | Freq: Every day | ORAL | Status: DC
  Administered 2021-10-19 – 2021-10-29 (×10): 100 mg via ORAL
  Filled 2021-10-19 (×10): qty 1

## 2021-10-19 MED ORDER — ACETAMINOPHEN 325 MG PO TABS: 650.0000 mg | ORAL_TABLET | Freq: Four times a day (QID) | ORAL | Status: DC | PRN

## 2021-10-19 MED ORDER — METHYLPREDNISOLONE SODIUM SUCC 40 MG IJ SOLR
40.0000 mg | Freq: Two times a day (BID) | INTRAMUSCULAR | Status: DC
  Administered 2021-10-19 – 2021-10-23 (×8): 40 mg via INTRAVENOUS
  Filled 2021-10-19 (×8): qty 1

## 2021-10-19 MED ORDER — LACTATED RINGERS IV SOLN: INTRAVENOUS | Status: DC

## 2021-10-19 MED ORDER — LISINOPRIL-HYDROCHLOROTHIAZIDE 10-12.5 MG PO TABS: 1.0000 | ORAL_TABLET | Freq: Every day | ORAL | Status: DC

## 2021-10-19 MED ORDER — METHYLPREDNISOLONE SODIUM SUCC 125 MG IJ SOLR
125.0000 mg | Freq: Once | INTRAMUSCULAR | Status: AC
  Administered 2021-10-19: 125 mg via INTRAVENOUS
  Filled 2021-10-19: qty 2

## 2021-10-19 MED ORDER — ENOXAPARIN SODIUM 40 MG/0.4ML IJ SOSY
40.0000 mg | PREFILLED_SYRINGE | INTRAMUSCULAR | Status: DC
  Administered 2021-10-20 – 2021-10-30 (×8): 40 mg via SUBCUTANEOUS
  Filled 2021-10-19 (×10): qty 0.4

## 2021-10-19 MED ORDER — SIMVASTATIN 20 MG PO TABS
20.0000 mg | ORAL_TABLET | Freq: Every day | ORAL | Status: DC
  Administered 2021-10-19 – 2021-10-29 (×11): 20 mg via ORAL
  Filled 2021-10-19 (×11): qty 1

## 2021-10-19 MED ORDER — FAMOTIDINE 20 MG PO TABS
40.0000 mg | ORAL_TABLET | Freq: Every day | ORAL | Status: DC
  Administered 2021-10-19 – 2021-10-30 (×11): 40 mg via ORAL
  Filled 2021-10-19 (×12): qty 2

## 2021-10-19 MED ORDER — ALBUTEROL SULFATE (2.5 MG/3ML) 0.083% IN NEBU
3.0000 mL | INHALATION_SOLUTION | RESPIRATORY_TRACT | Status: DC
  Administered 2021-10-19: 3 mL via RESPIRATORY_TRACT

## 2021-10-19 MED ORDER — IOHEXOL 300 MG/ML  SOLN
75.0000 mL | Freq: Once | INTRAMUSCULAR | Status: AC | PRN
  Administered 2021-10-19: 75 mL via INTRAVENOUS

## 2021-10-19 MED ORDER — MORPHINE SULFATE (PF) 4 MG/ML IV SOLN
4.0000 mg | Freq: Once | INTRAVENOUS | Status: AC
  Administered 2021-10-19: 4 mg via INTRAVENOUS
  Filled 2021-10-19: qty 1

## 2021-10-19 MED ORDER — HYDROCHLOROTHIAZIDE 12.5 MG PO TABS
12.5000 mg | ORAL_TABLET | Freq: Every day | ORAL | Status: DC
  Administered 2021-10-19 – 2021-10-30 (×10): 12.5 mg via ORAL
  Filled 2021-10-19 (×10): qty 1

## 2021-10-19 MED ORDER — IPRATROPIUM-ALBUTEROL 0.5-2.5 (3) MG/3ML IN SOLN
3.0000 mL | RESPIRATORY_TRACT | Status: DC
  Administered 2021-10-19 – 2021-10-22 (×19): 3 mL via RESPIRATORY_TRACT
  Filled 2021-10-19 (×18): qty 3

## 2021-10-19 MED ORDER — GUAIFENESIN ER 600 MG PO TB12
1200.0000 mg | ORAL_TABLET | Freq: Two times a day (BID) | ORAL | Status: DC
  Administered 2021-10-19 – 2021-10-20 (×3): 1200 mg via ORAL
  Filled 2021-10-19 (×3): qty 2

## 2021-10-19 MED ORDER — ACETAMINOPHEN 650 MG RE SUPP: 650.0000 mg | Freq: Four times a day (QID) | RECTAL | Status: DC | PRN

## 2021-10-19 MED ORDER — BISACODYL 5 MG PO TBEC
5.0000 mg | DELAYED_RELEASE_TABLET | Freq: Every day | ORAL | Status: DC | PRN
  Administered 2021-10-24 – 2021-10-26 (×2): 5 mg via ORAL
  Filled 2021-10-19 (×2): qty 1

## 2021-10-19 MED ORDER — ONDANSETRON HCL 4 MG/2ML IJ SOLN
4.0000 mg | Freq: Four times a day (QID) | INTRAMUSCULAR | Status: DC | PRN
  Administered 2021-10-23 – 2021-10-30 (×4): 4 mg via INTRAVENOUS
  Filled 2021-10-19 (×4): qty 2

## 2021-10-19 MED ORDER — LEVOTHYROXINE SODIUM 75 MCG PO TABS
75.0000 ug | ORAL_TABLET | Freq: Every day | ORAL | Status: DC
  Administered 2021-10-20 – 2021-10-30 (×10): 75 ug via ORAL
  Filled 2021-10-19 (×10): qty 1

## 2021-10-19 MED ORDER — FENTANYL CITRATE PF 50 MCG/ML IJ SOSY
25.0000 ug | PREFILLED_SYRINGE | INTRAMUSCULAR | Status: DC | PRN
  Administered 2021-10-20 – 2021-10-22 (×2): 25 ug via INTRAVENOUS
  Filled 2021-10-19 (×2): qty 1

## 2021-10-19 MED ORDER — LEVOFLOXACIN IN D5W 750 MG/150ML IV SOLN
750.0000 mg | Freq: Once | INTRAVENOUS | Status: AC
  Administered 2021-10-19: 750 mg via INTRAVENOUS
  Filled 2021-10-19: qty 150

## 2021-10-19 MED ORDER — ASPIRIN 81 MG PO TBEC
81.0000 mg | DELAYED_RELEASE_TABLET | Freq: Every day | ORAL | Status: DC
  Administered 2021-10-20 – 2021-10-30 (×9): 81 mg via ORAL
  Filled 2021-10-19 (×9): qty 1

## 2021-10-19 MED ORDER — LISINOPRIL 10 MG PO TABS
10.0000 mg | ORAL_TABLET | Freq: Every day | ORAL | Status: DC
  Administered 2021-10-19 – 2021-10-20 (×2): 10 mg via ORAL
  Filled 2021-10-19 (×2): qty 1

## 2021-10-19 MED ORDER — ONDANSETRON HCL 4 MG PO TABS: 4.0000 mg | ORAL_TABLET | Freq: Four times a day (QID) | ORAL | Status: DC | PRN

## 2021-10-19 MED ORDER — MAGNESIUM SULFATE 2 GM/50ML IV SOLN
2.0000 g | Freq: Once | INTRAVENOUS | Status: AC
  Administered 2021-10-19: 2 g via INTRAVENOUS
  Filled 2021-10-19: qty 50

## 2021-10-19 MED ORDER — DOXYCYCLINE HYCLATE 100 MG PO TABS
100.0000 mg | ORAL_TABLET | Freq: Two times a day (BID) | ORAL | Status: AC
  Administered 2021-10-20 – 2021-10-23 (×8): 100 mg via ORAL
  Filled 2021-10-19 (×8): qty 1

## 2021-10-19 MED ORDER — TRAZODONE HCL 50 MG PO TABS
25.0000 mg | ORAL_TABLET | Freq: Every evening | ORAL | Status: DC | PRN
  Administered 2021-10-20 – 2021-10-27 (×6): 25 mg via ORAL
  Filled 2021-10-19 (×6): qty 1

## 2021-10-19 MED ORDER — IOHEXOL 300 MG/ML  SOLN
100.0000 mL | Freq: Once | INTRAMUSCULAR | Status: AC | PRN
  Administered 2021-10-19: 75 mL via INTRAVENOUS

## 2021-10-19 MED ORDER — GABAPENTIN 300 MG PO CAPS
300.0000 mg | ORAL_CAPSULE | Freq: Every day | ORAL | Status: DC
  Administered 2021-10-19 – 2021-10-29 (×11): 300 mg via ORAL
  Filled 2021-10-19 (×11): qty 1

## 2021-10-19 MED ORDER — HYDROCODONE-ACETAMINOPHEN 5-325 MG PO TABS
1.0000 | ORAL_TABLET | Freq: Four times a day (QID) | ORAL | Status: DC | PRN
  Administered 2021-10-19 – 2021-10-20 (×4): 2 via ORAL
  Filled 2021-10-19 (×4): qty 2

## 2021-10-19 NOTE — ED Notes (Signed)
Dr. Sabra Heck at bedside for [facility] and patient verbalized understanding.

## 2021-10-19 NOTE — Assessment & Plan Note (Addendum)
--   presenting symptoms c/w acute COPD exacerbation -- continue IV steroids, antibiotics and bronchodilator treatments -- also planning left thoracentesis to assist with symptomatic relief -- follow up results of fluid testing, still pending at this time -- pulmonology consult requested. -- pt feeling a little better after left thoracentesis -- cytology pending

## 2021-10-19 NOTE — ED Notes (Signed)
Attempted IV for CT chest X2 without success

## 2021-10-19 NOTE — Plan of Care (Signed)
  Problem: Education: Goal: Knowledge of General Education information will improve Description Including pain rating scale, medication(s)/side effects and non-pharmacologic comfort measures Outcome: Progressing   Problem: Health Behavior/Discharge Planning: Goal: Ability to manage health-related needs will improve Outcome: Progressing   

## 2021-10-19 NOTE — H&P (Signed)
History and Physical  [place] GGY:[id number] DOB: 18-Jun-1945 DOA: 10/19/2021  PCP: [place], [place]  Patient coming from: Home  Level of care: Med-Surg  I have personally briefly reviewed patient's old medical records in [place]  Chief Complaint: SOB  HPI: Rhonda Cook is a 76 year old female longtime smoker currently dealing with bladder cancer treated by Dr. Lovena Neighbours with urology undergoing regular chemotherapy treatments most recently 1 week ago presented to the emergency department with several days of increasing shortness of breath.  Patient reports cough but denies fever.  She does have chills.  Patient also reports she has been getting some type of steroid injections in her back due to pain.  She was evaluated in the emergency department and found to have bilateral pleural effusions with findings of a very large left pleural effusion.  She also had evidence of mediastinal adenopathy.  There are some concern for paraspinal metastatic disease and additional CT abdomen with contrast imaging was recommended.  Patient was started on supportive measures and admission was requested.  Review of Systems: Review of Systems  Constitutional:  Positive for chills, malaise/fatigue and weight loss. Negative for diaphoresis and fever.  HENT: Negative.    Eyes: Negative.   Respiratory:  Positive for cough, sputum production, shortness of breath and wheezing. Negative for hemoptysis.   Cardiovascular:  Positive for palpitations. Negative for chest pain, leg swelling and PND.  Gastrointestinal:  Positive for abdominal pain and heartburn. Negative for blood in stool, constipation, diarrhea, melena, nausea and vomiting.  Genitourinary: Negative.   Musculoskeletal:  Positive for back pain, joint pain, myalgias and neck pain. Negative for falls.  Skin: Negative.   Neurological:  Positive for weakness. Negative for dizziness and headaches.   Endo/Heme/Allergies: Negative.   Psychiatric/Behavioral: Negative.       Past Medical History:  Diagnosis Date   Arthritis    Autoimmune thyroiditis    followed by pcp   Bladder cancer [facility])    urologist-- dr winter---  s/p TURBT 05-31-2018; 07-23-2018   Chronic constipation    Chronically dry eyes    COPD (chronic obstructive pulmonary disease) ([place]) (09-11-2019 denies sob or difficulty breathing,  no URI in past month)   followed by pcp--- per pt never used oxygen, has rescue inhaler and nebulizer prn   Diverticulosis of colon    History of adenomatous polyp of colon 08/ 16/ 2017   History of recurrent UTIs    Hyperlipidemia    Hypertension    followed by pcp   (09-11-2019 pt stated never had a stress test)   Hypothyroidism    Nocturia    Osteoporosis    PONV (postoperative nausea and vomiting)    slow to awaken, shakes if under too long    Past Surgical History:  Procedure Laterality Date   COLONOSCOPY N/A 12/15/2015   Procedure: COLONOSCOPY;  Surgeon: Rogene Houston, MD;  Location: [facility];  Service: Endoscopy;  Laterality: N/A;  730 - moved to 8/16 @ 7:30   CYSTOSCOPY WITH BIOPSY N/A 07/23/2018   Procedure: CYSTOSCOPY WITH BLADDER BIOPSY;  Surgeon: Ceasar Mons, MD;  Location: [facility];  Service: Urology;  Laterality: N/A;   CYSTOSCOPY WITH BIOPSY N/A 09/16/2019   Procedure: CYSTOSCOPY WITH BLADDER BIOPSY/ FULGURATION/ AND  INSTILLATION OF GEMCITABINE IN PACU;  Surgeon: Ceasar Mons, MD;  [facility];  Service: Urology;  Laterality: N/A;   FOOT SURGERY Right 2018   LAPAROSCOPIC  CHOLECYSTECTOMY  2000   TRANSURETHRAL RESECTION OF BLADDER TUMOR N/A 05/31/2018   Procedure: TRANSURETHRAL RESECTION OF BLADDER TUMOR (TURBT) WITH CYSTOSCOPY, INSTILLATION OF GEMCITABINE IN PACU;  Surgeon: Ceasar Mons, MD;  Location: [facility];  Service: Urology;  Laterality: N/A;     reports that she has been  smoking cigarettes. She has been smoking an average of .25 packs per day. She has never used smokeless tobacco. She reports that she does not drink alcohol and does not use drugs.  Allergies  Allergen Reactions   Oxycodone Itching and Nausea And Vomiting   Adhesive [Tape] Rash   Penicillins Nausea And Vomiting and Rash    Did it involve swelling of the face/tongue/throat, SOB, or low BP? no Did it involve sudden or severe rash/hives, skin peeling, or any reaction on the inside of your mouth or nose? yes Did you need to seek medical attention at a hospital or doctor's office? no When did it last happen?  20 years ago     If all above answers are "NO", may proceed with cephalosporin use.    History reviewed. No pertinent family history.  Prior to Admission medications   Medication Sig Start Date End Date Taking? Authorizing Provider  albuterol (PROAIR HFA) 108 (90 Base) MCG/ACT inhaler Inhale 2 puffs into the lungs every 4 (four) hours as needed for wheezing or shortness of breath.  03/26/17  Yes [provider]  albuterol (PROVENTIL) (2.5 MG/3ML) 0.083% nebulizer solution Take 2.5 mg by nebulization every 6 (six) hours as needed for wheezing or shortness of breath.  03/09/17  Yes [provider]  aspirin EC 81 MG tablet Take 1 tablet (81 mg total) by mouth daily. 12/23/15  Yes Rehman, Mechele Dawley, MD  CALCIUM PO Take 1 tablet by mouth daily.   Yes [provider]  celecoxib (CELEBREX) 200 MG capsule Take 200 mg by mouth 2 (two) times daily. 09/20/21  Yes [provider]  cholecalciferol (VITAMIN D3) 25 MCG (1000 UNIT) tablet Take 1,000 Units by mouth daily.   Yes [provider]  docusate sodium (COLACE) 100 MG capsule Take 100 mg by mouth at bedtime.   Yes [provider]  famotidine (PEPCID) 40 MG tablet Take 1 tablet by mouth daily. 09/20/21  Yes [provider]  Ferrous Sulfate (IRON PO) Take 1 tablet by mouth daily.   Yes [provider]  gabapentin (NEURONTIN) 300 MG capsule Take 300 mg by mouth at bedtime. 09/20/21  Yes [provider]  ibuprofen (ADVIL,MOTRIN) 200 MG tablet Take 800 mg by mouth daily as needed for headache or moderate pain.   Yes [provider]  levothyroxine (SYNTHROID, LEVOTHROID) 75 MCG tablet Take 75 mcg by mouth daily.  04/18/18  Yes [provider]  lisinopril-hydrochlorothiazide (PRINZIDE,ZESTORETIC) 10-12.5 MG tablet Take 1 tablet by mouth daily.  04/06/18  Yes [provider]  ondansetron (ZOFRAN) 4 MG tablet Take 1 tablet (4 mg total) by mouth every 6 (six) hours. As needed for nausea and vomiting 10/10/21  Yes Triplett, Tammy, PA-C  Probiotic Product (PROBIOTIC-10 PO) Take 1 capsule by mouth daily.   Yes [provider]  promethazine (PHENERGAN) 12.5 MG tablet Take 12.5 mg by mouth 3 (three) times daily as needed. 10/11/21  Yes [provider]  simvastatin (ZOCOR) 20 MG tablet Take 20 mg by mouth at bedtime.  11/29/15  Yes [provider]  Tetrahydrozoline HCl (VISINE OP) Place 1 drop into both eyes daily as  needed (dry eyes).   Yes [provider]    Physical Exam: Vitals:   10/19/21 1230 10/19/21 1300 10/19/21 1330 10/19/21 1404  BP: (!) 148/84 (!) 158/84 (!) 144/67   Pulse: 96 99 100 (!) 104  Resp: (!) 24 (!) 27 (!) 26 17  Temp:      TempSrc:      SpO2: 92% 98% 100% 99%  Weight:      Height:        Constitutional: NAD, calm, comfortable.  Eyes: PERRL, lids and conjunctivae normal ENMT: Mucous membranes are moist. Posterior pharynx clear of any exudate or lesions.Normal dentition.  Neck: normal, supple, no masses, no thyromegaly Respiratory: diminished BS LLL, diffuse expiratory wheezing heard bilateral.  Cardiovascular: normal s1, s2 sounds, no murmurs / rubs / gallops. No extremity edema. 2+ pedal pulses. No carotid bruits.  Abdomen: no tenderness, no masses palpated. No hepatosplenomegaly. Bowel sounds  positive.  Musculoskeletal: no clubbing / cyanosis. No joint deformity upper and lower extremities. Good ROM, no contractures. Normal muscle tone.  Skin: no rashes, lesions, ulcers. No induration Neurologic: CN 2-12 grossly intact. Sensation intact, DTR normal. Strength 5/5 in all 4.  Psychiatric: Normal judgment and insight. Alert and oriented x 3. Normal mood.   Labs on Admission: I have personally reviewed following labs and imaging studies  CBC: Recent Labs  Lab 10/19/21 0813  WBC 8.4  NEUTROABS 7.0  HGB 12.1  HCT 36.1  MCV 92.6  PLT 638   Basic Metabolic Panel: Recent Labs  Lab 10/19/21 0813  NA 136  K 3.6  CL 100  CO2 25  GLUCOSE 111*  BUN 12  CREATININE 0.49  CALCIUM 8.7*   GFR: Estimated Creatinine Clearance: 50.3 mL/min (by C-G formula based on SCr of 0.49 mg/dL). Liver Function Tests: No results for input(s): "AST", "ALT", "ALKPHOS", "BILITOT", "PROT", "ALBUMIN" in the last 168 hours. No results for input(s): "LIPASE", "AMYLASE" in the last 168 hours. No results for input(s): "AMMONIA" in the last 168 hours. Coagulation Profile: No results for input(s): "INR", "PROTIME" in the last 168 hours. Cardiac Enzymes: No results for input(s): "CKTOTAL", "CKMB", "CKMBINDEX", "TROPONINI" in the last 168 hours. BNP (last 3 results) No results for input(s): "PROBNP" in the last 8760 hours. HbA1C: No results for input(s): "HGBA1C" in the last 72 hours. CBG: No results for input(s): "GLUCAP" in the last 168 hours. Lipid Profile: No results for input(s): "CHOL", "HDL", "LDLCALC", "TRIG", "CHOLHDL", "LDLDIRECT" in the last 72 hours. Thyroid Function Tests: No results for input(s): "TSH", "T4TOTAL", "FREET4", "T3FREE", "THYROIDAB" in the last 72 hours. Anemia Panel: No results for input(s): "VITAMINB12", "FOLATE", "FERRITIN", "TIBC", "IRON", "RETICCTPCT" in the last 72 hours. Urine analysis:    Component Value Date/Time   COLORURINE YELLOW 10/10/2021 0911    APPEARANCEUR CLEAR 10/10/2021 0911   LABSPEC 1.013 10/10/2021 0911   PHURINE 6.0 10/10/2021 0911   GLUCOSEU NEGATIVE 10/10/2021 0911   HGBUR NEGATIVE 10/10/2021 0911   BILIRUBINUR NEGATIVE 10/10/2021 0911   KETONESUR NEGATIVE 10/10/2021 0911   PROTEINUR NEGATIVE 10/10/2021 0911   NITRITE NEGATIVE 10/10/2021 0911   LEUKOCYTESUR TRACE (A) 10/10/2021 0911    Radiological Exams on Admission: CT ABDOMEN PELVIS W CONTRAST  Result Date: 10/19/2021 CLINICAL DATA:  Metastatic disease evaluation, abnormal chest CT, known bladder cancer * Tracking Code: BO * EXAM: CT ABDOMEN AND PELVIS WITH CONTRAST TECHNIQUE: Multidetector CT imaging of the abdomen and pelvis was performed using the standard protocol following bolus administration of intravenous contrast. RADIATION DOSE REDUCTION:  This exam was performed according to the departmental dose-optimization program which includes automated exposure control, adjustment of the mA and/or kV according to patient size and/or use of iterative reconstruction technique. CONTRAST:  63mL OMNIPAQUE IOHEXOL 300 MG/ML  SOLN COMPARISON:  CT chest, 10/19/2021, CT abdomen pelvis, 05/13/2018 FINDINGS: Lower chest: Large left, moderate right pleural effusions and associated pleural thickening and nodularity. Please see prior same day CT chest. Hepatobiliary: No focal liver abnormality is seen. Status post cholecystectomy. Mild postop biliary dilatation. Pancreas: Atrophic appearance of the pancreatic tail with some adjacent fat stranding (series 2, image 27). No pancreatic ductal dilatation. Spleen: Normal in size without significant abnormality. Adrenals/Urinary Tract: Stable, definitively benign left adrenal adenomata (series 2, image 23). Kidneys are normal, without renal calculi, solid lesion, or hydronephrosis. Bladder is unremarkable. Stomach/Bowel: Stomach is within normal limits. Appendix is not clearly visualized. No evidence of bowel wall thickening, distention, or  inflammatory changes. Descending and sigmoid diverticulosis. Vascular/Lymphatic: Aortic atherosclerosis. 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 No enlarged abdominal or pelvic lymph nodes. Reproductive: No mass or other significant abnormality. Other: No abdominal wall hernia or abnormality. Small volume ascites in the low pelvis (series 2, image 70). Musculoskeletal: No acute or significant osseous findings. IMPRESSION: 1. No evidence of metastatic disease in the abdomen or pelvis. 2. Stable, definitively benign left adrenal adenomata, unchanged in comparison to examination dated 05/13/2018 and corresponding to queried paraspinous soft tissue thickening seen by prior examination of the chest. 3. Atrophic appearance of the pancreatic tail with some adjacent fat stranding, most consistent with sequelae of pancreatitis seen acutely on prior examination dated 05/13/2018. 4. 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 5. Small volume ascites in the low pelvis. 6. Please see separately reported same day examination of the chest for complete findings. Aortic Atherosclerosis (ICD10-I70.0). Electronically Signed   By: Delanna Ahmadi M.D.   On: 10/19/2021 13:46   CT Chest W Contrast  Result Date: 10/19/2021 CLINICAL DATA:  Shortness breath EXAM: CT CHEST WITH CONTRAST TECHNIQUE: Multidetector CT imaging of the chest was performed during intravenous contrast administration. RADIATION DOSE REDUCTION: This exam was performed according to the departmental dose-optimization program which includes automated exposure control, adjustment of the mA and/or kV according to patient size and/or use of iterative reconstruction technique. CONTRAST:  60mL OMNIPAQUE IOHEXOL 300 MG/ML  SOLN COMPARISON:  Chest x-ray dated October 19, 2021; CT of the abdomen and pelvis dated May 03, 2018. FINDINGS: Cardiovascular: Normal heart size. No pericardial  effusion. Atherosclerotic disease of the thoracic aorta. No definite coronary artery calcifications. Mediastinum/Nodes: Esophagus and thyroid are unremarkable. Enlarged mediastinal lymph nodes. Reference precarinal lymph node measuring 1.4 cm in short axis on series 2, image 84. Lungs/Pleura: Central airways are patent. Centrilobular emphysema. Bilateral nodular pleural thickening and perifissural nodules. Reference right pleural nodule measuring 9 mm on series 2, image 44. Irregular juxtapleural solid nodule of the left upper lobe measuring 1.5 x 1.0 cm. Additional subpleural nodule of the inferior left upper lobe measuring 1.7 x 1.2 cm on series 4, image 63. Small right pleural effusion and loculated moderate left pleural effusion. Near complete collapse of the left lower lobe. Upper Abdomen: Fullness of the pancreatic tail with adjacent fluid and numerous perisplenic varices. Left adrenal gland nodule measuring up to 1.5 cm, unchanged in size when compared with prior CT. Left paraspinal soft tissue thickening, measuring up to 1.6 cm. Musculoskeletal: No chest wall abnormality. No acute or significant osseous findings. IMPRESSION:  1. Small right pleural effusion and loculated moderate left pleural effusion with associated pleural thickening, numerous pleural and perifissural nodules and left upper lobe subpleural parenchymal nodules. Differential considerations include metastatic disease related to patient's bladder cancer versus primary lung malignancy. 2. Enlarged mediastinal lymph nodes, likely due to nodal metastatic disease. 3. Asymmetric left paraspinous soft tissue thickening, concerning for additional site of metastatic disease. Recommend contrast-enhanced CT of the abdomen and pelvis for further evaluation. 4. 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 Recommend attention on CT. 5.  Aortic Atherosclerosis (ICD10-I70.0) and Emphysema (ICD10-J43.9). Electronically Signed   By: Yetta Glassman M.D.   On: 10/19/2021 12:10   DG Chest Port 1 View  Result Date: 10/19/2021 CLINICAL DATA:  Cough, shortness of breath, history COPD, hypertension, smoker, bladder cancer EXAM: PORTABLE CHEST 1 VIEW COMPARISON:  Portable exam 0832 hours compared to 03/09/2017 FINDINGS: Normal heart size, mediastinal contours, and pulmonary vascularity. Atherosclerotic calcification aorta. New BILATERAL pleural effusions, small RIGHT, moderate LEFT. Bibasilar atelectasis greater on LEFT. No definite infiltrate or pneumothorax. Questionable nodular foci RIGHT lung. No acute osseous findings. IMPRESSION: BILATERAL pleural effusions and bibasilar atelectasis much greater on LEFT. Question nodular foci RIGHT lung. Further evaluation by CT chest with contrast recommended. Aortic Atherosclerosis (ICD10-I70.0). Electronically Signed   By: Lavonia Dana M.D.   On: 10/19/2021 08:49    EKG: Independently reviewed. Sinus tachycardia  Assessment/Plan Principal Problem:   Acute respiratory distress Active Problems:   Large Pleural effusion on left   Pleural effusion, right   Acute dyspnea   Bladder cancer on chemotherapy   Paraspinal soft tissue mass   Assessment and Plan: * Acute respiratory distress -- multifactorial given severe COPD, ongoing tobacco abuse, large left pleural effusion, right pleural effusion -- Treating supportively and planning for thoracentesis  Paraspinal soft tissue mass -- this is very concerning for metastatic disease  -- CT abdomen/pelvis with contrast ordered per radiologist recommendations  Bladder cancer on chemotherapy -- she now is presenting with findings concerning for metastatic disease  -- Thoracentesis ordered for left-sided pleural effusion and will have cytology studies performed -- palliative medicine consultation for goals of care discussions --my conversation with  patient and husband was that patient expressed that she did not want "life support" especially if no chance of meaningful improvement or recovery and does not want to take chance of being on ventilator long-term.   --they are agreeable to speak with chaplain about getting some advance directive paperwork completed   Acute dyspnea -- presenting symptoms c/w acute COPD exacerbation -- continue IV steroids, antibiotics and bronchodilator treatments -- also planning left thoracentesis to assist with symptomatic relief  Pleural effusion, right -- see orders for left sided US guided thoracentesis  Large Pleural effusion on left -- requesting US guided thoracentesis with fluid sent for cytology and other testing   DVT prophylaxis: enoxaparin   Code Status: DNR  Family Communication: husband at bedside 6/21  Disposition Plan: anticipating home   Consults called: palliative care   Admission status: INP  Level of care: Med-Surg Irwin Brakeman MD [facility] How to contact the [facility] Attending or Consulting provider [place] or covering provider during after hours [employer], for this patient?  Check the care team in [facility] and look for a) attending/consulting [facility] provider listed and b) the [facility] team listed Log into [email or web address] and use [place]'s universal password to access.  If you do not have the password, please contact the hospital operator. Locate the [facility] provider you are looking for under [facility] and page to a number that you can be directly reached. If you still have difficulty reaching the provider, please page the [facility] (Director on Call) for the Hospitalists listed on amion for assistance.   If 7PM-7AM, please contact night-coverage [email or web address] Password [facility]  10/19/2021, 2:08 PM

## 2021-10-19 NOTE — Assessment & Plan Note (Addendum)
--   this is very concerning for metastatic disease  -- CT abdomen/pelvis with contrast ordered per radiologist recommendations and was reassuring, no evidence of metastases seen

## 2021-10-19 NOTE — Assessment & Plan Note (Signed)
--   requesting US guided thoracentesis with fluid sent for cytology and other testing

## 2021-10-19 NOTE — ED Provider Notes (Signed)
[facility] EMERGENCY DEPARTMENT Provider Note   CSN: [id number] Arrival date & time: 10/19/21  0801     History  Chief Complaint  Patient presents with   Shortness of Breath    Rhonda Cook is a 76 y.o. female.  HPI  This patient is a 76 year old female, she has a history of longtime tobacco use infectious states she is smoked since her teenage years and continues to smoke however the last several days she has had increasing shortness of breath, increased work of breathing, increasing cough but has not had any fevers.  She does have some shaking chills.  The patient reports to me that she is actively treated for bladder cancer, she has had a couple of different surgeries and has been on several different rounds of chemotherapy.  Her last dose was on Wednesday approximately 1 week ago.  She also reports that she has been getting frequent injections in her back, she has had a total of 4 injections which she thinks is a steroid the most recent as well was 1 week ago.  She has ongoing back pain.  The reason that she came by ambulance today was because of increasing shortness of breath.  She was noted to be tachycardic to 115 and a sinus tachycardia, tachypneic at around 22 breaths/min, hypoxic at about 93% on room air with noted prolonged expiratory wheezing.  Home Medications Prior to Admission medications   Medication Sig Start Date End Date Taking? Authorizing Provider  albuterol (PROAIR HFA) 108 (90 Base) MCG/ACT inhaler Inhale 2 puffs into the lungs every 4 (four) hours as needed for wheezing or shortness of breath.  03/26/17   [provider]  albuterol (PROVENTIL) (2.5 MG/3ML) 0.083% nebulizer solution Take 2.5 mg by nebulization every 6 (six) hours as needed for wheezing or shortness of breath.  03/09/17   [provider]  aspirin EC 81 MG tablet Take 1 tablet (81 mg total) by mouth daily. 12/23/15   Rogene Houston, MD  celecoxib (CELEBREX) 200 MG capsule Take 200  mg by mouth 2 (two) times daily. 09/20/21   [provider]  docusate sodium (COLACE) 100 MG capsule Take 100 mg by mouth at bedtime.    [provider]  famotidine (PEPCID) 40 MG tablet Take 1 tablet by mouth daily. 09/20/21   [provider]  gabapentin (NEURONTIN) 300 MG capsule Take 300 mg by mouth at bedtime. 09/20/21   [provider]  ibuprofen (ADVIL,MOTRIN) 200 MG tablet Take 800 mg by mouth daily as needed for headache or moderate pain.    [provider]  levothyroxine (SYNTHROID, LEVOTHROID) 75 MCG tablet Take 75 mcg by mouth daily.  04/18/18   [provider]  lisinopril-hydrochlorothiazide (PRINZIDE,ZESTORETIC) 10-12.5 MG tablet Take 1 tablet by mouth daily.  04/06/18   [provider]  ondansetron (ZOFRAN) 4 MG tablet Take 1 tablet (4 mg total) by mouth every 6 (six) hours. 10/10/21   Triplett, Tammy, PA-C  ondansetron (ZOFRAN) 4 MG tablet Take 1 tablet (4 mg total) by mouth every 6 (six) hours. As needed for nausea and vomiting 10/10/21   Triplett, Tammy, PA-C  oxyCODONE-acetaminophen (PERCOCET/ROXICET) 5-325 MG tablet Take 1 tablet by mouth every 4 (four) hours as needed. 10/10/21   Triplett, Tammy, PA-C  predniSONE (DELTASONE) 20 MG tablet Take 2 tablets daily with breakfast. Patient not taking: Reported on 10/10/2021 08/14/21   Jaynee Eagles, PA-C  Probiotic Product (PROBIOTIC-10 PO) Take 1 capsule by mouth daily.  [provider]  simvastatin (ZOCOR) 20 MG tablet Take 20 mg by mouth at bedtime.  11/29/15   [provider]  Tetrahydrozoline HCl (VISINE OP) Place 1 drop into both eyes daily as needed (dry eyes).    [provider]      Allergies    Oxycodone, Adhesive [tape], and Penicillins    Review of Systems   Review of Systems  All other systems reviewed and are negative.   Physical Exam Updated Vital Signs BP (!) 148/69   Pulse 98   Temp 98.2 F (36.8 C) (Oral)   Resp 20   Ht 1.6 m  (5\' 3" )   Wt 59 kg   SpO2 92%   BMI 23.04 kg/m  Physical Exam Vitals and nursing note reviewed.  Constitutional:      General: She is not in acute distress.    Appearance: She is well-developed.  HENT:     Head: Normocephalic and atraumatic.     Mouth/Throat:     Pharynx: No oropharyngeal exudate.  Eyes:     General: No scleral icterus.       Right eye: No discharge.        Left eye: No discharge.     Conjunctiva/sclera: Conjunctivae normal.     Pupils: Pupils are equal, round, and reactive to light.  Neck:     Thyroid: No thyromegaly.     Vascular: No JVD.  Cardiovascular:     Rate and Rhythm: Regular rhythm. Tachycardia present.     Heart sounds: Normal heart sounds. No murmur heard.    No friction rub. No gallop.  Pulmonary:     Effort: Respiratory distress present.     Breath sounds: Wheezing present. No rales.     Comments: The patient has slight increased work of breathing, she speaks in just shortened sentences, she has prolonged expiratory phase and expiratory wheezing in all lung fields.  Scattered rales at the bases that seem to clear with deep breathing Abdominal:     General: Bowel sounds are normal. There is no distension.     Palpations: Abdomen is soft. There is no mass.     Tenderness: There is no abdominal tenderness.  Musculoskeletal:        General: No tenderness. Normal range of motion.     Cervical back: Normal range of motion and neck supple.     Right lower leg: No edema.     Left lower leg: No edema.  Lymphadenopathy:     Cervical: No cervical adenopathy.  Skin:    General: Skin is warm and dry.     Findings: No erythema or rash.  Neurological:     Mental Status: She is alert.     Coordination: Coordination normal.  Psychiatric:        Behavior: Behavior normal.     ED Results / Procedures / Treatments   Labs (all labs ordered are listed, but only abnormal results are displayed) Labs Reviewed  CBC WITH DIFFERENTIAL/PLATELET - Abnormal;  Notable for the following components:      Result Value   Lymphs Abs 0.4 (*)    All other components within normal limits  BASIC METABOLIC PANEL - Abnormal; Notable for the following components:   Glucose, Bld 111 (*)    Calcium 8.7 (*)    All other components within normal limits  CULTURE, BLOOD (ROUTINE X 2)  CULTURE, BLOOD (ROUTINE X 2)  RESP PANEL BY RT-PCR (FLU A&B, COVID) [id number]  URINE  CULTURE  LACTIC ACID, PLASMA  URINALYSIS, ROUTINE W REFLEX MICROSCOPIC    EKG EKG Interpretation  Date/Time:  Wednesday October 19 2021 08:10:51 EDT Ventricular Rate:  109 PR Interval:  109 QRS Duration: 71 QT Interval:  313 QTC Calculation: 422 R Axis:   45 Text Interpretation: Sinus tachycardia Borderline repolarization abnormality Confirmed by Noemi Chapel [phone]) on 10/19/2021 9:59:05 AM  Radiology CT Chest W Contrast  Result Date: 10/19/2021 CLINICAL DATA:  Shortness breath EXAM: CT CHEST WITH CONTRAST TECHNIQUE: Multidetector CT imaging of the chest was performed during intravenous contrast administration. RADIATION DOSE REDUCTION: This exam was performed according to the departmental dose-optimization program which includes automated exposure control, adjustment of the mA and/or kV according to patient size and/or use of iterative reconstruction technique. CONTRAST:  74mL OMNIPAQUE IOHEXOL 300 MG/ML  SOLN COMPARISON:  Chest x-ray dated October 19, 2021; CT of the abdomen and pelvis dated May 03, 2018. FINDINGS: Cardiovascular: Normal heart size. No pericardial effusion. Atherosclerotic disease of the thoracic aorta. No definite coronary artery calcifications. Mediastinum/Nodes: Esophagus and thyroid are unremarkable. Enlarged mediastinal lymph nodes. Reference precarinal lymph node measuring 1.4 cm in short axis on series 2, image 84. Lungs/Pleura: Central airways are patent. Centrilobular emphysema. Bilateral nodular pleural thickening and perifissural nodules. Reference right pleural nodule  measuring 9 mm on series 2, image 44. Irregular juxtapleural solid nodule of the left upper lobe measuring 1.5 x 1.0 cm. Additional subpleural nodule of the inferior left upper lobe measuring 1.7 x 1.2 cm on series 4, image 63. Small right pleural effusion and loculated moderate left pleural effusion. Near complete collapse of the left lower lobe. Upper Abdomen: Fullness of the pancreatic tail with adjacent fluid and numerous perisplenic varices. Left adrenal gland nodule measuring up to 1.5 cm, unchanged in size when compared with prior CT. Left paraspinal soft tissue thickening, measuring up to 1.6 cm. Musculoskeletal: No chest wall abnormality. No acute or significant osseous findings. IMPRESSION: 1. Small right pleural effusion and loculated moderate left pleural effusion with associated pleural thickening, numerous pleural and perifissural nodules and left upper lobe subpleural parenchymal nodules. Differential considerations include metastatic disease related to patient's bladder cancer versus primary lung malignancy. 2. Enlarged mediastinal lymph nodes, likely due to nodal metastatic disease. 3. Asymmetric left paraspinous soft tissue thickening, concerning for additional site of metastatic disease. Recommend contrast-enhanced CT of the abdomen and pelvis for further evaluation. 4. 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 Recommend attention on CT. 5. Aortic Atherosclerosis (ICD10-I70.0) and Emphysema (ICD10-J43.9). Electronically Signed   By: Yetta Glassman M.D.   On: 10/19/2021 12:10   DG Chest Port 1 View  Result Date: 10/19/2021 CLINICAL DATA:  Cough, shortness of breath, history COPD, hypertension, smoker, bladder cancer EXAM: PORTABLE CHEST 1 VIEW COMPARISON:  Portable exam 0832 hours compared to 03/09/2017 FINDINGS: Normal heart size, mediastinal contours, and pulmonary  vascularity. Atherosclerotic calcification aorta. New BILATERAL pleural effusions, small RIGHT, moderate LEFT. Bibasilar atelectasis greater on LEFT. No definite infiltrate or pneumothorax. Questionable nodular foci RIGHT lung. No acute osseous findings. IMPRESSION: BILATERAL pleural effusions and bibasilar atelectasis much greater on LEFT. Question nodular foci RIGHT lung. Further evaluation by CT chest with contrast recommended. Aortic Atherosclerosis (ICD10-I70.0). Electronically Signed   By: Lavonia Dana M.D.   On: 10/19/2021 08:49    Procedures Procedures    Medications Ordered in ED Medications  albuterol (PROVENTIL) (2.5 MG/3ML) 0.083% nebulizer  solution 3 mL (0 mLs Nebulization Stopped 10/19/21 1005)  lactated ringers infusion ( Intravenous New Bag/Given 10/19/21 0835)  levofloxacin (LEVAQUIN) IVPB 750 mg (has no administration in time range)  methylPREDNISolone sodium succinate (SOLU-MEDROL) 125 mg/2 mL injection 125 mg (125 mg Intravenous Given 10/19/21 0859)  magnesium sulfate IVPB 2 g 50 mL (0 g Intravenous Stopped 10/19/21 1005)  morphine (PF) 4 MG/ML injection 4 mg (4 mg Intravenous Given 10/19/21 0918)  iohexol (OMNIPAQUE) 300 MG/ML solution 100 mL (75 mLs Intravenous Contrast Given 10/19/21 1135)    ED Course/ Medical Decision Making/ A&P                           Medical Decision Making Amount and/or Complexity of Data Reviewed Labs: ordered. Radiology: ordered. ECG/medicine tests: ordered.  Risk Prescription drug management. Decision regarding hospitalization.   This patient presents to the ED for concern of increasing shortness of breath, this involves an extensive number of treatment options, and is a complaint that carries with it a high risk of complications and morbidity.  The differential diagnosis includes sepsis, pneumonia, pneumothorax, COPD exacerbation, less likely to be pulmonary embolism given the diffuse wheezing and history of COPD   Co morbidities that  complicate the patient evaluation  History of chronic tobacco use, history of cancer, could be immunosuppressed   Additional history obtained:  Additional history obtained from electronic medical record External records from outside source obtained and reviewed including had been seen for low back pain June 12, was at the urgent care for hip pain in April, followed by oncology because of bladder wall malignant neoplasm. Notes from an office visit in April show that she had a dexamethasone injection in her sacroiliac joint No notes from oncology seen recently regarding chemo   Lab Tests:  I Ordered, and personally interpreted labs.  The pertinent results include: No leukocytosis no anemia and normal platelets.  The metabolic panel is also unremarkable with only a slight hyperglycemia at 111.  Lactic acid of 1.1.  COVID and flu test are negative.   Imaging Studies ordered:  I ordered imaging studies including portable chest x-ray I independently visualized and interpreted imaging which showed pleural effusions bilaterally left more than right.  CT scan was ordered to further clarify showing what appears to be larger potentially loculated effusions left greater than right with some pulmonary abnormalities and nodules as well I agree with the radiologist interpretation   Cardiac Monitoring: / EKG:  The patient was maintained on a cardiac monitor.  I personally viewed and interpreted the cardiac monitored which showed an underlying rhythm of: Sinus tachycardia   Consultations Obtained:  I requested consultation with the hospitalist Dr. Wynetta Emery,  and discussed lab and imaging findings as well as pertinent plan - they recommend: Admission to the hospital and will facilitate further evaluation with potential thoracentesis   Problem List / ED Course / Critical interventions / Medication management  The patient is short of breath likely multifactorial but certainly involving pulmonary  inflammation and wheezing which was treated with albuterol, pleural effusion which will be treated once a formal diagnosis is made with thoracentesis I ordered medication including albuterol and Solu-Medrol as well as magnesium sulfate.  Morphine was also given for chronic back pain Reevaluation of the patient after these medicines showed that the patient slightly improved I have reviewed the patients home medicines and have made adjustments as needed   Social Determinants of Health:  Bladder cancer  Lung disease   Test / Admission - Considered:  Will be admitted to the hospital         Final Clinical Impression(s) / ED Diagnoses Final diagnoses:  Pleural effusion  Lung nodules  Respiratory distress    Rx / DC Orders ED Discharge Orders     None         Noemi Chapel, MD 10/19/21 1307

## 2021-10-19 NOTE — Assessment & Plan Note (Addendum)
--   multifactorial given severe COPD, ongoing tobacco abuse, large left pleural effusion, right pleural effusion -- Treating supportively and planning for thoracentesis -- hopefully dyspnea will improve after thoracentesis

## 2021-10-19 NOTE — ED Triage Notes (Signed)
Patient states increased SHOB for 2 days with cough. Patient states chills but denies fever. Patient has hx of kidney cancer with chemo and sx. Patient complaining of severe back pain.

## 2021-10-19 NOTE — Assessment & Plan Note (Signed)
--   see orders for left sided US guided thoracentesis

## 2021-10-19 NOTE — Assessment & Plan Note (Addendum)
--   she now is presenting with findings concerning for metastatic disease  -- Thoracentesis ordered for left-sided pleural effusion and will have cytology studies performed -- palliative medicine consultation for goals of care discussions --my conversation with patient and husband was that patient expressed that she did not want "life support" especially if no chance of meaningful improvement or recovery and does not want to take chance of being on ventilator long-term.   --they are agreeable to speak with chaplain about getting some advance directive paperwork completed

## 2021-10-19 NOTE — Hospital Course (Addendum)
76 year old female with past medical history of bladder cancer undergoing chemotherapy presented to the hospital with 1 week history of increasing shortness of breath, some cough but no fever or chills.  In the ED, patient was noted to have bilateral pleural effusion and very large left pleural effusion with  mediastinal lymphadenopathy.  There was some concern for paraspinal metastatic disease.  Patient was then considered for admission to the hospital for further evaluation and treatment.  During hospitalization, patient underwent thoracocentesis followed by chest tube placement. Pulmonary followed the patient during hospitalization and patient underwent bronchoscopy with endoscopic ultrasound and lymph node biopsy on 10/28/2021.  Chest tube was subsequently removed on 10/29/2021 and patient remained stable afterwards.  Following conditions were addressed during hospitalization,  Acute respiratory distress/dyspnea Symptomatically improved.  Thought to be multifactorial secondary to COPD exacerbation, bilateral pleural effusion, large left pleural effusion.  IR was consulted and patient underwent ultrasound-guided thoracocentesis with removal of 240 mL of bloody fluid on 10/20/2021.  Pleural fluid culture was negative but showed atypical cells.    Blood cultures meant negative.  IR was again consulted and patient underwent IR guided pleural drain placement on 10/25/2021.    Pulmonary followed the patient during hospitalization and chest tube has been removed 10/29/2021.  Patient also received IV steroids and bronchodilators during hospitalization which will be changed to p.o. prednisone for next 3 days on discharge.   Patient also underwent bronchoscopy with endoscopic ultrasound lymph node biopsy on 10/28/2021 since there was more concern for pulmonary malignancy as primary.  Communicated with Dr. Valeta Harms pulmonary who recommends follow-up with Dr. Julien Nordmann, oncology on discharge.  Medicated with the multiple family  members at bedside.  Large loculated likely malignant pleural effusion on left Status post thoracocentesis with bloody fluid.  CT scan showed some pleural thickening and loculation.  Pleural fluid cytology showing atypical cells x2.  Status post IR guided chest tube placement and  bronchoscopy with biopsy 10/28/2021.  At this time patient has clinically and radiographically improved.  Will need to follow-up with oncology on discharge.  Paraspinal soft tissue mass Concerning for metastatic disease.  CT scan of the abdomen pelvis 10/19/2021 without any evidence of metastasis.  CT chest with paraspinal soft tissue.  History of bladder cancer and chemotherapy with Dr. Gilford Rile as outpatient.  Bladder cancer on chemotherapy Patient was seen by Dr. Gilford Rile as outpatient.  Dr. Lovena Neighbours believes that the patient has a localized bladder and unlikely to explain pulmonary findings.  Will need to follow-up with Dr. Gilford Rile as outpatient.   Pleural effusion, right Small for thoracocentesis.  Nutrition Status: Severe malnutrition.  Present on admission.  Received nutritional supplements during hospitalization. Nutrition Problem: Severe Malnutrition Etiology: cancer and cancer related treatments Signs/Symptoms: severe muscle depletion, severe fat depletion Interventions: Refer to RD note for recommendations

## 2021-10-20 ENCOUNTER — Inpatient Hospital Stay (HOSPITAL_COMMUNITY): Payer: PPO

## 2021-10-20 ENCOUNTER — Encounter (HOSPITAL_COMMUNITY): Payer: Self-pay | Admitting: Family Medicine

## 2021-10-20 DIAGNOSIS — M7989 Other specified soft tissue disorders: Secondary | ICD-10-CM

## 2021-10-20 DIAGNOSIS — J90 Pleural effusion, not elsewhere classified: Secondary | ICD-10-CM | POA: Diagnosis not present

## 2021-10-20 DIAGNOSIS — Z7189 Other specified counseling: Secondary | ICD-10-CM

## 2021-10-20 DIAGNOSIS — C679 Malignant neoplasm of bladder, unspecified: Secondary | ICD-10-CM | POA: Diagnosis not present

## 2021-10-20 DIAGNOSIS — R918 Other nonspecific abnormal finding of lung field: Secondary | ICD-10-CM | POA: Diagnosis not present

## 2021-10-20 DIAGNOSIS — R0600 Dyspnea, unspecified: Secondary | ICD-10-CM | POA: Diagnosis not present

## 2021-10-20 DIAGNOSIS — R0603 Acute respiratory distress: Secondary | ICD-10-CM | POA: Diagnosis not present

## 2021-10-20 LAB — URINE CULTURE: Culture: NO GROWTH

## 2021-10-20 MED ORDER — DM-GUAIFENESIN ER 30-600 MG PO TB12
2.0000 | ORAL_TABLET | Freq: Two times a day (BID) | ORAL | Status: DC
  Administered 2021-10-20 – 2021-10-21 (×2): 2 via ORAL
  Filled 2021-10-20: qty 2
  Filled 2021-10-20: qty 1

## 2021-10-20 MED ORDER — PANTOPRAZOLE SODIUM 40 MG PO TBEC: 40.0000 mg | DELAYED_RELEASE_TABLET | Freq: Two times a day (BID) | ORAL | Status: DC

## 2021-10-20 MED ORDER — HYDROCODONE-ACETAMINOPHEN 5-325 MG PO TABS
1.0000 | ORAL_TABLET | ORAL | Status: DC | PRN
  Administered 2021-10-20 – 2021-10-29 (×20): 2 via ORAL
  Administered 2021-10-30: 1 via ORAL
  Filled 2021-10-20 (×2): qty 2
  Filled 2021-10-20: qty 1
  Filled 2021-10-20 (×10): qty 2
  Filled 2021-10-20: qty 1
  Filled 2021-10-20 (×8): qty 2

## 2021-10-20 MED ORDER — CLONIDINE HCL 0.1 MG PO TABS
0.1000 mg | ORAL_TABLET | Freq: Three times a day (TID) | ORAL | Status: DC
  Administered 2021-10-20 – 2021-10-30 (×27): 0.1 mg via ORAL
  Filled 2021-10-20 (×27): qty 1

## 2021-10-20 MED ORDER — PANTOPRAZOLE SODIUM 40 MG PO TBEC
40.0000 mg | DELAYED_RELEASE_TABLET | Freq: Every day | ORAL | Status: DC
  Administered 2021-10-21 – 2021-10-30 (×10): 40 mg via ORAL
  Filled 2021-10-20 (×10): qty 1

## 2021-10-20 NOTE — Procedures (Addendum)
  [place] Left thoracentesis  240 cc bloody fluid obtained from loculated effusion Sent for labs per MD  Tolerated well  [id number]  CXR: NO PTX per Dr Tery Sanfilippo

## 2021-10-20 NOTE — Progress Notes (Signed)
Patient transported to [place] Rm 1 per stretcher in no obvious distress. Oxygen in place at 2 [address]. Procedure explained by PA, consent obtained. Placed in upright position at bedside with table. Local anesthesia admin without complication. Access obtained through left thorax without difficulty. Serosanguinous drainage returned, and connected to drainage. 4 syringes retrieved, connected to suction. Access removed, bandaid placed, no obvious bleeding/hematoma. Transported to Xray per stretcher.

## 2021-10-20 NOTE — Progress Notes (Signed)
  Transition of Care [facility]) Screening Note   Patient Details  Name: Rhonda Cook Date of Birth: 03-15-1946   [facility] of [facility]) CM/SW Contact:    Ihor Gully, LCSW Phone Number: 10/20/2021, 11:45 AM    Transition of Care Department [facility]) has reviewed patient and no TOC needs have been identified at this time. We will continue to monitor patient advancement through interdisciplinary progression rounds. If new patient transition needs arise, please place a TOC consult.

## 2021-10-20 NOTE — Consult Note (Signed)
Consultation Note Date: 10/20/2021   Patient Name: Rhonda Cook  DOB: 17-Apr-1946  MRN: [id number]  Age / Sex: 76 y.o., female  PCP: [facility] Referring Physician: Murlean Iba, MD  Reason for Consultation: Establishing goals of care  HPI/Patient Profile: 76 y.o. female  with past medical history of longtime smoker with COPD, currently being treated for bladder cancer with trusted urologist, Rhonda Cook, autoimmune thyroiditis, arthritis, HTN/HLD, osteoporosis, admitted on 10/19/2021 with acute respiratory distress COPD exacerbation with ongoing tobacco use and large left and right pleural effusions.  Thoracentesis left lung 6/22 240cc bloody fluid sent for analysis.   Clinical Assessment and Goals of Care: I have reviewed medical records including EPIC notes, labs and imaging, received report from RN, assessed the patient.  Rhonda Cook is sitting up quietly in bed.  She appears acutely/chronically ill and quite frail and weak.  She is alert and oriented x3, able to make her basic needs known.  Her daughter, Colletta Maryland, is present at bedside.  We meet at bedside to discuss diagnosis prognosis, GOC, EOL wishes, disposition and options. I introduced Palliative Medicine as specialized medical care for people living with serious illness. It focuses on providing relief from the symptoms and stress of a serious illness. The goal is to improve quality of life for both the patient and the family.  We focused on their current illness.  We talk about her breathing issues and the treatment plan.  Rhonda Cook shares that she is getting scheduled respiratory treatments.  She is able to clearly express what happened during thoracentesis this morning.  She tells me that she has several pockets of fluid, but only 1 was drained.  She understands that fluid was sent for testing.  I share that it will take  several days for any results.  We also talk about her bladder cancer and treatments.  She tells me that she is continuing treatments with her trusted urologist, Rhonda Cook.  She states that she will talk with him about treatment plans and options, recommendations for care and providers.  She states she has an appointment scheduled, I think, 6/28.  The natural disease trajectory and expectations at EOL were discussed.  Advanced directives, concepts specific to code status, artifical feeding and hydration, and rehospitalization were considered and discussed.  Rhonda Cook states that her husband/healthcare surrogate, Rhonda Cook, agrees with her desire for DNR.  She tells me that her daughter Colletta Maryland who is present at bedside does not agree.  She remains DNR.  Hospice and Palliative Care services outpatient were explained and offered.  We talked about the benefits of outpatient palliative services for continued goals of care discussions, talking about the "what if's, and maybe's".  I encouraged Rhonda Cook to talk with her trusted urologist for input.  At this point she is considering outpatient palliative services.  Discussed the importance of continued conversation with family and the medical providers regarding overall plan of care and treatment options, ensuring decisions are within the context of the patient's values  and GOCs.  Questions and concerns were addressed.  The patient was encouraged to call with questions or concerns.  PMT will continue to support holistically.  Conference with attending, bedside nursing staff, transition of care team related to patient condition, needs, goals of care, disposition.   HCPOA NEXT OF KIN -spouse, Rhonda Cook.  She states that she would want her daughter Rhonda Cook's input also.   SUMMARY OF RECOMMENDATIONS   Continue to treat the treatable but no CPR or intubation. Awaiting results of pleural fluid Follow-up with trusted oncologist, Rhonda Cook Considering  outpatient palliative services   Code Status/Advance Care Planning: DNR -Rhonda Cook states that her husband/healthcare surrogate, Rhonda Cook, agrees with her desire for DNR.  She tells me that her daughter Colletta Maryland who is present at bedside does not agree.  She remains DNR.  Symptom Management:  Per hospitalist, no additional needs at this time.  Palliative Prophylaxis:  Frequent Pain Assessment and Oral Care  Additional Recommendations (Limitations, Scope, Preferences): Continue to treat the treatable but no CPR or intubation  Psycho-social/Spiritual:  Desire for further Chaplaincy support:yes Additional Recommendations: Caregiving  Support/Resources and Education on Hospice  Prognosis:  Unable to determine, based on outcomes, results from pleural fluid testing, treatment options offered.  Discharge Planning:  Anticipate home with home health, declines short-term rehab even if qualified.  Considering outpatient palliative services.       Primary Diagnoses: Present on Admission:  Acute respiratory distress  Large Pleural effusion on left  Pleural effusion, right  Acute dyspnea  Bladder cancer on chemotherapy  Paraspinal soft tissue mass   I have reviewed the medical record, interviewed the patient and family, and examined the patient. The following aspects are pertinent.  Past Medical History:  Diagnosis Date   Arthritis    Autoimmune thyroiditis    followed by pcp   Bladder cancer [facility])    urologist-- dr Cook---  s/p TURBT 05-31-2018; 07-23-2018   Chronic constipation    Chronically dry eyes    COPD (chronic obstructive pulmonary disease) ([place]) (09-11-2019 denies sob or difficulty breathing,  no URI in past month)   followed by pcp--- per pt never used oxygen, has rescue inhaler and nebulizer prn   Diverticulosis of colon    History of adenomatous polyp of colon 08/ 16/ 2017   History of recurrent UTIs    Hyperlipidemia    Hypertension    followed by pcp    (09-11-2019 pt stated never had a stress test)   Hypothyroidism    Nocturia    Osteoporosis    PONV (postoperative nausea and vomiting)    slow to awaken, shakes if under too long   Social History   Socioeconomic History   Marital status: Married    Spouse name: Not on file   Number of children: Not on file   Years of education: Not on file   Highest education level: Not on file  Occupational History   Not on file  Tobacco Use   Smoking status: Every Day    Packs/day: 0.25    Types: Cigarettes   Smokeless tobacco: Never   Tobacco comments:    09-11-2019  per pt trying to quit,  down to 1/2 pp3d  Vaping Use   Vaping Use: Never used  Substance and Sexual Activity   Alcohol use: No   Drug use: Never   Sexual activity: Not on file  Other Topics Concern   Not on file  Social History Narrative   Not on file  Social Determinants of Health   Financial Resource Strain: Not on file  Food Insecurity: Not on file  Transportation Needs: Not on file  Physical Activity: Not on file  Stress: Not on file  Social Connections: Not on file   History reviewed. No pertinent family history. Scheduled Meds:  aspirin EC  81 mg Oral Daily   docusate sodium  100 mg Oral QHS   doxycycline  100 mg Oral Q12H   enoxaparin (LOVENOX) injection  40 mg Subcutaneous Q24H   famotidine  40 mg Oral Daily   gabapentin  300 mg Oral QHS   guaiFENesin  1,200 mg Oral BID   hydrochlorothiazide  12.5 mg Oral Daily   ipratropium-albuterol  3 mL Nebulization Q4H   levothyroxine  75 mcg Oral Q0600   lisinopril  10 mg Oral Daily   methylPREDNISolone (SOLU-MEDROL) injection  40 mg Intravenous Q12H   simvastatin  20 mg Oral QHS   Continuous Infusions:  lactated ringers 50 mL/hr at 10/19/21 2214   PRN Meds:.acetaminophen **OR** acetaminophen, bisacodyl, fentaNYL (SUBLIMAZE) injection, HYDROcodone-acetaminophen, ondansetron **OR** ondansetron (ZOFRAN) IV, traZODone Medications Prior to Admission:  Prior  to Admission medications   Medication Sig Start Date End Date Taking? Authorizing Provider  albuterol (PROAIR HFA) 108 (90 Base) MCG/ACT inhaler Inhale 2 puffs into the lungs every 4 (four) hours as needed for wheezing or shortness of breath.  03/26/17  Yes [provider]  albuterol (PROVENTIL) (2.5 MG/3ML) 0.083% nebulizer solution Take 2.5 mg by nebulization every 6 (six) hours as needed for wheezing or shortness of breath.  03/09/17  Yes [provider]  aspirin EC 81 MG tablet Take 1 tablet (81 mg total) by mouth daily. 12/23/15  Yes Rehman, Mechele Dawley, MD  CALCIUM PO Take 1 tablet by mouth daily.   Yes [provider]  celecoxib (CELEBREX) 200 MG capsule Take 200 mg by mouth 2 (two) times daily. 09/20/21  Yes [provider]  cholecalciferol (VITAMIN D3) 25 MCG (1000 UNIT) tablet Take 1,000 Units by mouth daily.   Yes [provider]  docusate sodium (COLACE) 100 MG capsule Take 100 mg by mouth at bedtime.   Yes [provider]  famotidine (PEPCID) 40 MG tablet Take 1 tablet by mouth daily. 09/20/21  Yes [provider]  Ferrous Sulfate (IRON PO) Take 1 tablet by mouth daily.   Yes [provider]  gabapentin (NEURONTIN) 300 MG capsule Take 300 mg by mouth at bedtime. 09/20/21  Yes [provider]  ibuprofen (ADVIL,MOTRIN) 200 MG tablet Take 800 mg by mouth daily as needed for headache or moderate pain.   Yes [provider]  levothyroxine (SYNTHROID, LEVOTHROID) 75 MCG tablet Take 75 mcg by mouth daily.  04/18/18  Yes [provider]  lisinopril-hydrochlorothiazide (PRINZIDE,ZESTORETIC) 10-12.5 MG tablet Take 1 tablet by mouth daily.  04/06/18  Yes [provider]  ondansetron (ZOFRAN) 4 MG tablet Take 1 tablet (4 mg total) by mouth every 6 (six) hours. As needed for nausea and vomiting 10/10/21  Yes Triplett, Tammy, PA-C  Probiotic Product (PROBIOTIC-10 PO) Take 1 capsule by mouth daily.   Yes  [provider]  promethazine (PHENERGAN) 12.5 MG tablet Take 12.5 mg by mouth 3 (three) times daily as needed. 10/11/21  Yes [provider]  simvastatin (ZOCOR) 20 MG tablet Take 20 mg by mouth at bedtime.  11/29/15  Yes [provider]  Tetrahydrozoline HCl (VISINE OP) Place 1 drop into both eyes daily as needed (dry eyes).  Yes [provider]   Allergies  Allergen Reactions   Oxycodone Itching and Nausea And Vomiting   Adhesive [Tape] Rash   Penicillins Nausea And Vomiting and Rash    Did it involve swelling of the face/tongue/throat, SOB, or low BP? no Did it involve sudden or severe rash/hives, skin peeling, or any reaction on the inside of your mouth or nose? yes Did you need to seek medical attention at a hospital or doctor's office? no When did it last happen?  20 years ago     If all above answers are "NO", may proceed with cephalosporin use.   Review of Systems  Unable to perform ROS: Acuity of condition    Physical Exam Vitals and nursing note reviewed.  Constitutional:      General: She is not in acute distress.    Appearance: She is ill-appearing.  Cardiovascular:     Rate and Rhythm: Normal rate.  Pulmonary:     Effort: Pulmonary effort is normal.  Skin:    General: Skin is warm and dry.  Neurological:     Mental Status: She is alert and oriented to person, place, and time.  Psychiatric:        Mood and Affect: Mood normal.        Behavior: Behavior normal.     Vital Signs: BP (!) 162/88 (BP Location: Right Arm)   Pulse 93   Temp (!) 97.5 F (36.4 C) (Oral)   Resp 18   Ht 5\' 3"  (1.6 m)   Wt 59 kg   SpO2 98%   BMI 23.04 kg/m  Pain Scale: 0-10 POSS *See Group Information*: S-Acceptable,Sleep, easy to arouse Pain Score: 10-Worst pain ever   SpO2: SpO2: 98 % O2 Device:SpO2: 98 % O2 Flow Rate: .O2 Flow Rate (L/min): 2 L/min  IO: Intake/output summary:  Intake/Output Summary (Last 24 hours) at 10/20/2021 1335 Last  data filed at 10/20/2021 0900 Gross per 24 hour  Intake 2287.23 ml  Output 850 ml  Net 1437.23 ml    LBM: Last BM Date : 10/18/21 Baseline Weight: Weight: 59 kg Most recent weight: Weight: 59 kg     Palliative Assessment/Data:   Flowsheet Rows    Flowsheet Row Most Recent Value  Intake Tab   Referral Department Hospitalist  Unit at Time of Referral Med/Surg Unit  Palliative Care Primary Diagnosis Cancer  Date Notified 10/19/21  Palliative Care Type New Palliative care  Reason for referral Clarify Goals of Care  Date of Admission 10/19/21  Date first seen by Palliative Care 10/20/21  # of days Palliative referral response time 1 Day(s)  # of days IP prior to Palliative referral 0  Clinical Assessment   Palliative Performance Scale Score 50%  Pain Max last 24 hours Not able to report  Pain Min Last 24 hours Not able to report  Dyspnea Max Last 24 Hours Not able to report  Dyspnea Min Last 24 hours Not able to report  Psychosocial & Spiritual Assessment   Palliative Care Outcomes        Time In: 1015 Time Out: 1110 Time Total: 75 minutes Greater than 50%  of this time was spent counseling and coordinating care related to the above assessment and plan.  Signed by: Drue Novel, NP   Please contact Palliative Medicine Team phone at [phone] for questions and concerns.  For individual provider: See Shea Evans

## 2021-10-20 NOTE — Progress Notes (Signed)
PROGRESS NOTE   Rhonda Cook  PFX:[id number] DOB: 1945-07-25 DOA: 10/19/2021 PCP: [facility]   Chief Complaint  Patient presents with   Shortness of Breath   Level of care: Med-Surg  Brief Admission History:  76 year old female longtime smoker currently dealing with bladder cancer treated by Dr. Lovena Neighbours with urology undergoing regular chemotherapy treatments most recently 1 week ago presented to the emergency department with several days of increasing shortness of breath.  Patient reports cough but denies fever.  She does have chills.  Patient also reports she has been getting some type of steroid injections in her back due to pain.  She was evaluated in the emergency department and found to have bilateral pleural effusions with findings of a very large left pleural effusion.  She also had evidence of mediastinal adenopathy.  There are some concern for paraspinal metastatic disease and additional CT abdomen with contrast imaging was recommended.  Patient was started on supportive measures and admission was requested.   Assessment and Plan: * Acute respiratory distress -- multifactorial given severe COPD, ongoing tobacco abuse, large left pleural effusion, right pleural effusion -- Treating supportively and planning for thoracentesis -- hopefully dyspnea will improve after thoracentesis   Paraspinal soft tissue mass -- this is very concerning for metastatic disease  -- CT abdomen/pelvis with contrast ordered per radiologist recommendations and was reassuring, no evidence of metastases seen   Bladder cancer on chemotherapy -- she now is presenting with findings concerning for metastatic disease  -- Thoracentesis ordered for left-sided pleural effusion and will have cytology studies performed -- palliative medicine consultation for goals of care discussions --my conversation with patient and husband was that patient expressed that she did not want "life support"  especially if no chance of meaningful improvement or recovery and does not want to take chance of being on ventilator long-term.   --they are agreeable to speak with chaplain about getting some advance directive paperwork completed   Acute dyspnea -- presenting symptoms c/w acute COPD exacerbation -- continue IV steroids, antibiotics and bronchodilator treatments -- also planning left thoracentesis to assist with symptomatic relief -- follow up results of fluid testing, still pending at this time -- pulmonology consult requested.   Pleural effusion, right -- see orders for left sided US guided thoracentesis  Large Pleural effusion on left -- requesting US guided thoracentesis with fluid sent for cytology and other testing   DVT prophylaxis: enoxaparin  Code Status: DNR  Family Communication: husband updated 6/21 Disposition: Status is: Inpatient Remains inpatient appropriate because: IV steroid treatments,    Consultants:  Palliative care  Procedures:  US thoracentesis 6/22  Antimicrobials:    Subjective: Pt reports that she is having shortness of breath.  She is agreeable to thoracentesis later today.   Objective: Vitals:   10/20/21 0925 10/20/21 1230 10/20/21 1235 10/20/21 1521  BP: (!) 170/79  (!) 162/88   Pulse: (!) 103  93   Resp: 18  18   Temp:   (!) 97.5 F (36.4 C)   TempSrc:   Oral   SpO2: 96% 96% 98% 98%  Weight:      Height:        Intake/Output Summary (Last 24 hours) at 10/20/2021 1632 Last data filed at 10/20/2021 0900 Gross per 24 hour  Intake 1257.17 ml  Output 850 ml  Net 407.17 ml   Filed Weights   10/19/21 0817  Weight: 59 kg   Examination:  General exam: awake, alert, cooperative, Appears  calm and comfortable  Respiratory system: diminished BS LLL.  Expiratory rales heard. Respiratory effort normal. Cardiovascular system: normal S1 & S2 heard. No JVD, murmurs, rubs, gallops or clicks. No pedal edema. Gastrointestinal system: Abdomen is  nondistended, soft and nontender. No organomegaly or masses felt. Normal bowel sounds heard. Central nervous system: Alert and oriented. No focal neurological deficits. Extremities: Symmetric 5 x 5 power. Skin: No rashes, lesions or ulcers. Psychiatry: Judgement and insight appear normal. Mood & affect appropriate.   Data Reviewed: I have personally reviewed following labs and imaging studies  CBC: Recent Labs  Lab 10/19/21 0813  WBC 8.4  NEUTROABS 7.0  HGB 12.1  HCT 36.1  MCV 92.6  PLT 762    Basic Metabolic Panel: Recent Labs  Lab 10/19/21 0813  NA 136  K 3.6  CL 100  CO2 25  GLUCOSE 111*  BUN 12  CREATININE 0.49  CALCIUM 8.7*    CBG: No results for input(s): "GLUCAP" in the last 168 hours.  Recent Results (from the past 240 hour(s))  Blood culture (routine x 2)     Status: None (Preliminary result)   Collection Time: 10/19/21  8:13 AM   Specimen: BLOOD RIGHT FOREARM  Result Value Ref Range Status   Specimen Description BLOOD RIGHT FOREARM  Final   Special Requests   Final    Immunocompromised BOTTLES DRAWN AEROBIC AND ANAEROBIC Blood Culture adequate volume   Culture   Final    NO GROWTH < 24 HOURS Performed at [facility], [address], [place], [place] [address]    Report Status PENDING  Incomplete  Resp Panel by RT-PCR (Flu A&B, Covid) Anterior Nasal Swab     Status: None   Collection Time: 10/19/21  8:13 AM   Specimen: Anterior Nasal Swab  Result Value Ref Range Status   SARS Coronavirus 2 by RT PCR NEGATIVE NEGATIVE Final    Comment: (NOTE) SARS-CoV-2 target nucleic acids are NOT DETECTED.  The SARS-CoV-2 RNA is generally detectable in upper respiratory specimens during the acute phase of infection. The lowest concentration of SARS-CoV-2 viral copies this assay can detect is 138 copies/mL. A negative result does not preclude SARS-Cov-2 infection and should not be used as the sole basis for treatment or other patient management decisions. A  negative result may occur with  improper specimen collection/handling, submission of specimen other than nasopharyngeal swab, presence of viral mutation(s) within the areas targeted by this assay, and inadequate number of viral copies(<138 copies/mL). A negative result must be combined with clinical observations, patient history, and epidemiological information. The expected result is Negative.  Fact Sheet for Patients:  EntrepreneurPulse.com.au  Fact Sheet for Healthcare Providers:  IncredibleEmployment.be  This test is no t yet approved or cleared by the [place] FDA and  has been authorized for detection and/or diagnosis of SARS-CoV-2 by FDA under an Emergency Use Authorization (EUA). This EUA will remain  in effect (meaning this test can be used) for the duration of the COVID-19 declaration under Section 564(b)(1) of the Act, 21 U.S.C.section 360bbb-3(b)(1), unless the authorization is terminated  or revoked sooner.       Influenza A by PCR NEGATIVE NEGATIVE Final   Influenza B by PCR NEGATIVE NEGATIVE Final    Comment: (NOTE) The Xpert Xpress SARS-CoV-2/FLU/RSV plus assay is intended as an aid in the diagnosis of influenza from Nasopharyngeal swab specimens and should not be used as a sole basis for treatment. Nasal washings and aspirates are unacceptable for Xpert Xpress  SARS-CoV-2/FLU/RSV testing.  Fact Sheet for Patients: EntrepreneurPulse.com.au  Fact Sheet for Healthcare Providers: IncredibleEmployment.be  This test is not yet approved or cleared by the [place] FDA and has been authorized for detection and/or diagnosis of SARS-CoV-2 by FDA under an Emergency Use Authorization (EUA). This EUA will remain in effect (meaning this test can be used) for the duration of the COVID-19 declaration under Section 564(b)(1) of the Act, 21 U.S.C. section 360bbb-3(b)(1), unless the authorization  is terminated or revoked.  Performed at [facility], [address], [place], [place] [address]   Blood culture (routine x 2)     Status: None (Preliminary result)   Collection Time: 10/19/21  8:18 AM   Specimen: BLOOD RIGHT HAND  Result Value Ref Range Status   Specimen Description BLOOD RIGHT HAND  Final   Special Requests   Final    Immunocompromised BOTTLES DRAWN AEROBIC AND ANAEROBIC Blood Culture results may not be optimal due to an inadequate volume of blood received in culture bottles   Culture   Final    NO GROWTH < 24 HOURS Performed at [facility], [address] [place], [place] [address]    Report Status PENDING  Incomplete  Gram stain     Status: None   Collection Time: 10/20/21  9:00 AM   Specimen: PATH Cytology Peritoneal fluid  Result Value Ref Range Status   Specimen Description PERITONEAL  Final   Special Requests NONE  Final   Gram Stain   Final    NO ORGANISMS SEEN WBC PRESENT,BOTH PMN AND MONONUCLEAR CYTOSPIN SMEAR Performed at [facility], [address] [place], [place] [address]    Report Status 10/20/2021 FINAL  Final  Culture, body fluid w Gram Stain-bottle     Status: None (Preliminary result)   Collection Time: 10/20/21  9:00 AM   Specimen: Peritoneal Washings  Result Value Ref Range Status   Specimen Description PERITONEAL  Final   Special Requests   Final    BOTTLES DRAWN AEROBIC AND ANAEROBIC 10CC Performed at [facility], [address], [place], [place] [address]    Culture PENDING  Incomplete   Report Status PENDING  Incomplete     Radiology Studies: US THORACENTESIS ASP PLEURAL SPACE W/IMG GUIDE  Result Date: 10/20/2021 INDICATION: Left loculated pleural effusion EXAM: ULTRASOUND GUIDED LEFT THORACENTESIS MEDICATIONS: 10 cc 1% lidocaine. COMPLICATIONS: None immediate. PROCEDURE: An ultrasound guided thoracentesis was thoroughly discussed with the patient and questions answered. The benefits, risks, alternatives and complications were  also discussed. The patient understands and wishes to proceed with the procedure. Written consent was obtained. Ultrasound was performed to localize and mark an adequate pocket of fluid in the left chest. The area was then prepped and draped in the normal sterile fashion. 1% Lidocaine was used for local anesthesia. Under ultrasound guidance a Yueh catheter was introduced. Thoracentesis was performed. The catheter was removed and a dressing applied. FINDINGS: A total of approximately 240 cc of bloody fluid was removed. Samples were sent to the laboratory as requested by the clinical team. IMPRESSION: Successful ultrasound guided left thoracentesis yielding of pleural fluid. CXR pending Read by Lavonia Drafts [facility] Electronically Signed   By: Lavonia Dana M.D.   On: 10/20/2021 12:07   DG Chest 1 View  Result Date: 10/20/2021 CLINICAL DATA:  Post left thoracentesis. EXAM: CHEST  1 VIEW COMPARISON:  10/19/2021 FINDINGS: 0923 hours. Interval decrease left pleural effusion without evidence for left-sided pneumothorax. Small right pleural effusion is similar. There  is persistent bibasilar collapse/consolidation. Pleural base nodular opacities in the right lung evident, as seen on recent chest CT. Stable cardiopericardial silhouette. The visualized bony structures of the thorax are unremarkable. IMPRESSION: No evidence for pneumothorax after left thoracentesis with decrease in left pleural fluid collection. Electronically Signed   By: Misty Stanley M.D.   On: 10/20/2021 10:05   [place] CHEST (PLEURAL EFFUSION)  Result Date: 10/19/2021 CLINICAL DATA:  Pleural effusions EXAM: CHEST ULTRASOUND COMPARISON:  Chest radiograph 10/19/2021 FINDINGS: Highly loculated moderate-sized LEFT pleural effusion identified with numerous septations and scattered debris. Small uncomplicated appearing RIGHT pleural effusion. IMPRESSION: Moderate-sized LEFT pleural effusion which is highly loculated by numerous septations. Small RIGHT simple  appearing RIGHT pleural effusion. Electronically Signed   By: Lavonia Dana M.D.   On: 10/19/2021 14:59   CT ABDOMEN PELVIS W CONTRAST  Result Date: 10/19/2021 CLINICAL DATA:  Metastatic disease evaluation, abnormal chest CT, known bladder cancer * Tracking Code: BO * EXAM: CT ABDOMEN AND PELVIS WITH CONTRAST TECHNIQUE: Multidetector CT imaging of the abdomen and pelvis was performed using the standard protocol following bolus administration of intravenous contrast. RADIATION DOSE REDUCTION: This exam was performed according to the departmental dose-optimization program which includes automated exposure control, adjustment of the mA and/or kV according to patient size and/or use of iterative reconstruction technique. CONTRAST:  23mL OMNIPAQUE IOHEXOL 300 MG/ML  SOLN COMPARISON:  CT chest, 10/19/2021, CT abdomen pelvis, 05/13/2018 FINDINGS: Lower chest: Large left, moderate right pleural effusions and associated pleural thickening and nodularity. Please see prior same day CT chest. Hepatobiliary: No focal liver abnormality is seen. Status post cholecystectomy. Mild postop biliary dilatation. Pancreas: Atrophic appearance of the pancreatic tail with some adjacent fat stranding (series 2, image 27). No pancreatic ductal dilatation. Spleen: Normal in size without significant abnormality. Adrenals/Urinary Tract: Stable, definitively benign left adrenal adenomata (series 2, image 23). Kidneys are normal, without renal calculi, solid lesion, or hydronephrosis. Bladder is unremarkable. Stomach/Bowel: Stomach is within normal limits. Appendix is not clearly visualized. No evidence of bowel wall thickening, distention, or inflammatory changes. Descending and sigmoid diverticulosis. Vascular/Lymphatic: Aortic atherosclerosis. 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 No enlarged abdominal or pelvic lymph nodes. Reproductive: No mass or other significant abnormality. Other: No  abdominal wall hernia or abnormality. Small volume ascites in the low pelvis (series 2, image 70). Musculoskeletal: No acute or significant osseous findings. IMPRESSION: 1. No evidence of metastatic disease in the abdomen or pelvis. 2. Stable, definitively benign left adrenal adenomata, unchanged in comparison to examination dated 05/13/2018 and corresponding to queried paraspinous soft tissue thickening seen by prior examination of the chest. 3. Atrophic appearance of the pancreatic tail with some adjacent fat stranding, most consistent with sequelae of pancreatitis seen acutely on prior examination dated 05/13/2018. 4. 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 5. Small volume ascites in the low pelvis. 6. Please see separately reported same day examination of the chest for complete findings. Aortic Atherosclerosis (ICD10-I70.0). Electronically Signed   By: Delanna Ahmadi M.D.   On: 10/19/2021 13:46   CT Chest W Contrast  Result Date: 10/19/2021 CLINICAL DATA:  Shortness breath EXAM: CT CHEST WITH CONTRAST TECHNIQUE: Multidetector CT imaging of the chest was performed during intravenous contrast administration. RADIATION DOSE REDUCTION: This exam was performed according to the departmental dose-optimization program which includes automated exposure control, adjustment of the mA and/or kV according to patient size and/or use of iterative reconstruction technique. CONTRAST:  3mL OMNIPAQUE IOHEXOL 300  MG/ML  SOLN COMPARISON:  Chest x-ray dated October 19, 2021; CT of the abdomen and pelvis dated May 03, 2018. FINDINGS: Cardiovascular: Normal heart size. No pericardial effusion. Atherosclerotic disease of the thoracic aorta. No definite coronary artery calcifications. Mediastinum/Nodes: Esophagus and thyroid are unremarkable. Enlarged mediastinal lymph nodes. Reference precarinal lymph node measuring 1.4 cm in short axis on series 2, image 84. Lungs/Pleura: Central  airways are patent. Centrilobular emphysema. Bilateral nodular pleural thickening and perifissural nodules. Reference right pleural nodule measuring 9 mm on series 2, image 44. Irregular juxtapleural solid nodule of the left upper lobe measuring 1.5 x 1.0 cm. Additional subpleural nodule of the inferior left upper lobe measuring 1.7 x 1.2 cm on series 4, image 63. Small right pleural effusion and loculated moderate left pleural effusion. Near complete collapse of the left lower lobe. Upper Abdomen: Fullness of the pancreatic tail with adjacent fluid and numerous perisplenic varices. Left adrenal gland nodule measuring up to 1.5 cm, unchanged in size when compared with prior CT. Left paraspinal soft tissue thickening, measuring up to 1.6 cm. Musculoskeletal: No chest wall abnormality. No acute or significant osseous findings. IMPRESSION: 1. Small right pleural effusion and loculated moderate left pleural effusion with associated pleural thickening, numerous pleural and perifissural nodules and left upper lobe subpleural parenchymal nodules. Differential considerations include metastatic disease related to patient's bladder cancer versus primary lung malignancy. 2. Enlarged mediastinal lymph nodes, likely due to nodal metastatic disease. 3. Asymmetric left paraspinous soft tissue thickening, concerning for additional site of metastatic disease. Recommend contrast-enhanced CT of the abdomen and pelvis for further evaluation. 4. 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 Recommend attention on CT. 5. Aortic Atherosclerosis (ICD10-I70.0) and Emphysema (ICD10-J43.9). Electronically Signed   By: Yetta Glassman M.D.   On: 10/19/2021 12:10   DG Chest Port 1 View  Result Date: 10/19/2021 CLINICAL DATA:  Cough, shortness of breath, history COPD, hypertension, smoker, bladder cancer EXAM: PORTABLE  CHEST 1 VIEW COMPARISON:  Portable exam 0832 hours compared to 03/09/2017 FINDINGS: Normal heart size, mediastinal contours, and pulmonary vascularity. Atherosclerotic calcification aorta. New BILATERAL pleural effusions, small RIGHT, moderate LEFT. Bibasilar atelectasis greater on LEFT. No definite infiltrate or pneumothorax. Questionable nodular foci RIGHT lung. No acute osseous findings. IMPRESSION: BILATERAL pleural effusions and bibasilar atelectasis much greater on LEFT. Question nodular foci RIGHT lung. Further evaluation by CT chest with contrast recommended. Aortic Atherosclerosis (ICD10-I70.0). Electronically Signed   By: Lavonia Dana M.D.   On: 10/19/2021 08:49    Scheduled Meds:  aspirin EC  81 mg Oral Daily   docusate sodium  100 mg Oral QHS   doxycycline  100 mg Oral Q12H   enoxaparin (LOVENOX) injection  40 mg Subcutaneous Q24H   famotidine  40 mg Oral Daily   gabapentin  300 mg Oral QHS   guaiFENesin  1,200 mg Oral BID   hydrochlorothiazide  12.5 mg Oral Daily   ipratropium-albuterol  3 mL Nebulization Q4H   levothyroxine  75 mcg Oral Q0600   lisinopril  10 mg Oral Daily   methylPREDNISolone (SOLU-MEDROL) injection  40 mg Intravenous Q12H   simvastatin  20 mg Oral QHS   Continuous Infusions:  lactated ringers 50 mL/hr at 10/19/21 2214    LOS: 1 day   Time spent: 37 mins  Amzie Sillas Wynetta Emery, MD How to contact the [facility] Attending or Consulting provider [place] or covering provider during  after hours 7P -7A, for this patient?  Check the care team in [facility] and look for a) attending/consulting [facility] provider listed and b) the [facility] team listed Log into [email or web address] and use [place] universal password to access. If you do not have the password, please contact the hospital operator. Locate the [facility] provider you are looking for under [facility] and page to a number that you can be directly reached. If you still have difficulty reaching the provider, please page the [facility]  (Director on Call) for the Hospitalists listed on amion for assistance.  10/20/2021, 4:32 PM

## 2021-10-21 DIAGNOSIS — R0603 Acute respiratory distress: Secondary | ICD-10-CM | POA: Diagnosis not present

## 2021-10-21 DIAGNOSIS — R0600 Dyspnea, unspecified: Secondary | ICD-10-CM | POA: Diagnosis not present

## 2021-10-21 DIAGNOSIS — C679 Malignant neoplasm of bladder, unspecified: Secondary | ICD-10-CM | POA: Diagnosis not present

## 2021-10-21 DIAGNOSIS — J90 Pleural effusion, not elsewhere classified: Secondary | ICD-10-CM | POA: Diagnosis not present

## 2021-10-21 LAB — GRAM STAIN

## 2021-10-21 LAB — BODY FLUID CELL COUNT WITH DIFFERENTIAL
Eos, Fluid: 1 %
Lymphs, Fluid: 82 %
Monocyte-Macrophage-Serous Fluid: 9 % — ABNORMAL LOW (ref 50–90)
Neutrophil Count, Fluid: 8 % (ref 0–25)
Total Nucleated Cell Count, Fluid: 682 cu mm (ref 0–1000)

## 2021-10-21 LAB — GLUCOSE, PLEURAL OR PERITONEAL FLUID: Glucose, Fluid: 102 mg/dL

## 2021-10-21 LAB — PROTEIN, PLEURAL OR PERITONEAL FLUID: Total protein, fluid: 3.5 g/dL

## 2021-10-21 MED ORDER — GUAIFENESIN ER 600 MG PO TB12
1200.0000 mg | ORAL_TABLET | Freq: Two times a day (BID) | ORAL | Status: AC
Start: 2021-10-21 — End: 2021-10-26
  Administered 2021-10-21 – 2021-10-25 (×8): 1200 mg via ORAL
  Filled 2021-10-21 (×9): qty 2

## 2021-10-21 NOTE — Evaluation (Signed)
Physical Therapy Evaluation Patient Details Name: Rhonda Cook MRN: [id number] DOB: 09/09/1945 Today's Date: 10/21/2021  History of Present Illness  Rhonda Cook is a 76 year old female longtime smoker currently dealing with bladder cancer treated by Dr. Liliane Shi with urology undergoing regular chemotherapy treatments most recently 1 week ago presented to the emergency department with several days of increasing shortness of breath.  Patient reports cough but denies fever.  She does have chills.  Patient also reports she has been getting some type of steroid injections in her back due to pain.  She was evaluated in the emergency department and found to have bilateral pleural effusions with findings of a very large left pleural effusion.  She also had evidence of mediastinal adenopathy.  There are some concern for paraspinal metastatic disease and additional CT abdomen with contrast imaging was recommended.  Patient was started on supportive measures and admission was requested.   Clinical Impression  Patient requires increased time for sitting up at bedside with [facility] slightly raised, demonstrates slow labored cadence with frequent scissoring of legs and occasional staggering left/right once fatigued and tolerated sitting up in chair after therapy with family members present at bedside.  Patient encouraged to request assistance when needing to go to bathroom here in hospital and when return home with family.  Patient will benefit from continued skilled physical therapy in hospital and recommended venue below to increase strength, balance, endurance for safe ADLs and gait.  Patient walker on room air with SpO2 at 95%, RT present and aware.       Recommendations for follow up therapy are one component of a multi-disciplinary discharge planning process, led by the attending physician.  Recommendations may be updated based on patient status, additional functional criteria and insurance authorization.  Follow  Up Recommendations Home health PT      Assistance Recommended at Discharge Set up Supervision/Assistance  Patient can return home with the following  A little help with walking and/or transfers;A little help with bathing/dressing/bathroom;Help with stairs or ramp for entrance;Assistance with cooking/housework    Equipment Recommendations None recommended by PT  Recommendations for Other Services       Functional Status Assessment Patient has had a recent decline in their functional status and demonstrates the ability to make significant improvements in function in a reasonable and predictable amount of time.     Precautions / Restrictions Precautions Precautions: Fall Restrictions Weight Bearing Restrictions: No      Mobility  Bed Mobility Overal bed mobility: Needs Assistance Bed Mobility: Supine to Sit     Supine to sit: HOB elevated, Supervision, Min guard     General bed mobility comments: increased time, labored movement    Transfers Overall transfer level: Needs assistance Equipment used: Rolling walker (2 wheels) Transfers: Sit to/from Stand, Bed to chair/wheelchair/BSC Sit to Stand: Min guard   Step pivot transfers: Min guard       General transfer comment: unsteady labored movement having to lean on armrest of chair, safer using RW    Ambulation/Gait Ambulation/Gait assistance: Min assist   Assistive device: Rolling walker (2 wheels) Gait Pattern/deviations: Decreased step length - right, Decreased step length - left, Decreased stride length, Staggering left, Staggering right, Scissoring Gait velocity: decreased     General Gait Details: slow labored cadence with frequent scissoring of legs, staggering left/right once fatigued  Stairs            Wheelchair Mobility    Modified Rankin (Stroke Patients Only)  Balance Overall balance assessment: Needs assistance Sitting-balance support: Feet supported, No upper extremity  supported Sitting balance-Leahy Scale: Fair Sitting balance - Comments: fair/good seated at EOB   Standing balance support: During functional activity, No upper extremity supported Standing balance-Leahy Scale: Poor Standing balance comment: fair using RW                             Pertinent Vitals/Pain Pain Assessment Pain Assessment: 0-10 Pain Score: 8  Pain Location: low back Pain Descriptors / Indicators: Sore, Aching Pain Intervention(s): Limited activity within patient's tolerance, Monitored during session, Repositioned    Home Living Family/patient expects to be discharged to:: Private residence Living Arrangements: Spouse/significant other Available Help at Discharge: Family;Available 24 hours/day Type of Home: House Home Access: Stairs to enter Entrance Stairs-Rails: None Entrance Stairs-Number of Steps: 1   Home Layout: Laundry or work area in basement;Able to live on main level with bedroom/bathroom;Full bath on main level;Other (Comment) (Patient states she does not go into basement) Home Equipment: Rollator (4 wheels);Cane - quad      Prior Function Prior Level of Function : Needs assist       Physical Assist : Mobility (physical);ADLs (physical) Mobility (physical): Bed mobility;Transfers;Gait;Stairs   Mobility Comments: household ambulator using Rollator most of time, occasionally Quad-cane when feeling better ADLs Comments: assisted by family     Hand Dominance        Extremity/Trunk Assessment   Upper Extremity Assessment Upper Extremity Assessment: Overall WFL for tasks assessed    Lower Extremity Assessment Lower Extremity Assessment: Generalized weakness    Cervical / Trunk Assessment Cervical / Trunk Assessment: Normal  Communication   Communication: No difficulties  Cognition Arousal/Alertness: Awake/alert Behavior During Therapy: WFL for tasks assessed/performed Overall Cognitive Status: Within Functional Limits for  tasks assessed                                          General Comments      Exercises     Assessment/Plan    PT Assessment Patient needs continued PT services  PT Problem List Decreased strength;Decreased activity tolerance;Decreased balance;Decreased mobility       PT Treatment Interventions DME instruction;Gait training;Stair training;Functional mobility training;Therapeutic activities;Therapeutic exercise;Balance training;Patient/family education    PT Goals (Current goals can be found in the Care Plan section)  Acute Rehab PT Goals Patient Stated Goal: return home with family to assist PT Goal Formulation: With patient/family Time For Goal Achievement: 10/24/21 Potential to Achieve Goals: Good    Frequency Min 3X/week     Co-evaluation               AM-PAC PT "6 Clicks" Mobility  Outcome Measure Help needed turning from your back to your side while in a flat bed without using bedrails?: None Help needed moving from lying on your back to sitting on the side of a flat bed without using bedrails?: A Little Help needed moving to and from a bed to a chair (including a wheelchair)?: A Little Help needed standing up from a chair using your arms (e.g., wheelchair or bedside chair)?: A Little Help needed to walk in hospital room?: A Lot Help needed climbing 3-5 steps with a railing? : A Lot 6 Click Score: 17    End of Session   Activity Tolerance: Patient tolerated treatment well;Patient limited by  fatigue Patient left: in chair;with call bell/phone within reach;with family/visitor present Nurse Communication: Mobility status PT Visit Diagnosis: Unsteadiness on feet (R26.81);Other abnormalities of gait and mobility (R26.89);Muscle weakness (generalized) (M62.81)    Time: 9562-1308 PT Time Calculation (min) (ACUTE ONLY): 20 min   Charges:   PT Evaluation $PT Eval Moderate Complexity: 1 Mod PT Treatments $Therapeutic Activity: 8-22 mins         2:34 PM, 10/21/21 Ocie Bob, MPT Physical Therapist with [facility] [phone] office [phone] mobile phone

## 2021-10-22 DIAGNOSIS — R301 Vesical tenesmus: Secondary | ICD-10-CM | POA: Diagnosis present

## 2021-10-22 DIAGNOSIS — R0603 Acute respiratory distress: Secondary | ICD-10-CM | POA: Diagnosis not present

## 2021-10-22 DIAGNOSIS — C679 Malignant neoplasm of bladder, unspecified: Secondary | ICD-10-CM | POA: Diagnosis not present

## 2021-10-22 DIAGNOSIS — R0600 Dyspnea, unspecified: Secondary | ICD-10-CM | POA: Diagnosis not present

## 2021-10-22 DIAGNOSIS — J90 Pleural effusion, not elsewhere classified: Secondary | ICD-10-CM | POA: Diagnosis not present

## 2021-10-22 MED ORDER — HYDROMORPHONE HCL 1 MG/ML IJ SOLN
0.5000 mg | INTRAMUSCULAR | Status: DC | PRN
  Administered 2021-10-22 – 2021-10-26 (×20): 1 mg via INTRAVENOUS
  Administered 2021-10-27: 0.5 mg via INTRAVENOUS
  Administered 2021-10-28 – 2021-10-29 (×4): 1 mg via INTRAVENOUS
  Administered 2021-10-29: 0.5 mg via INTRAVENOUS
  Administered 2021-10-30 (×3): 1 mg via INTRAVENOUS
  Filled 2021-10-22 (×29): qty 1

## 2021-10-22 MED ORDER — IPRATROPIUM-ALBUTEROL 0.5-2.5 (3) MG/3ML IN SOLN
3.0000 mL | RESPIRATORY_TRACT | Status: DC
  Administered 2021-10-23 (×2): 3 mL via RESPIRATORY_TRACT
  Filled 2021-10-22 (×4): qty 3

## 2021-10-22 NOTE — Assessment & Plan Note (Addendum)
--   Pt having bout of severe painful bladder spasm - given IV pain medication for relief  -- bladder spasms better today

## 2021-10-23 DIAGNOSIS — R0603 Acute respiratory distress: Secondary | ICD-10-CM | POA: Diagnosis not present

## 2021-10-23 DIAGNOSIS — J90 Pleural effusion, not elsewhere classified: Secondary | ICD-10-CM | POA: Diagnosis not present

## 2021-10-23 DIAGNOSIS — R0600 Dyspnea, unspecified: Secondary | ICD-10-CM | POA: Diagnosis not present

## 2021-10-23 DIAGNOSIS — C679 Malignant neoplasm of bladder, unspecified: Secondary | ICD-10-CM | POA: Diagnosis not present

## 2021-10-23 MED ORDER — POLYETHYLENE GLYCOL 3350 17 G PO PACK
17.0000 g | PACK | Freq: Every day | ORAL | Status: DC
  Administered 2021-10-23 – 2021-10-30 (×5): 17 g via ORAL
  Filled 2021-10-23 (×6): qty 1

## 2021-10-23 MED ORDER — METHYLPREDNISOLONE SODIUM SUCC 40 MG IJ SOLR
40.0000 mg | INTRAMUSCULAR | Status: DC
Start: 2021-10-24 — End: 2021-10-30
  Administered 2021-10-24 – 2021-10-30 (×6): 40 mg via INTRAVENOUS
  Filled 2021-10-23 (×6): qty 1

## 2021-10-24 ENCOUNTER — Encounter (HOSPITAL_COMMUNITY): Payer: Self-pay | Admitting: Family Medicine

## 2021-10-24 ENCOUNTER — Inpatient Hospital Stay (HOSPITAL_COMMUNITY): Payer: PPO

## 2021-10-24 DIAGNOSIS — R0603 Acute respiratory distress: Secondary | ICD-10-CM | POA: Diagnosis not present

## 2021-10-24 LAB — CULTURE, BLOOD (ROUTINE X 2)
Culture: NO GROWTH
Culture: NO GROWTH

## 2021-10-24 LAB — AMYLASE, PLEURAL OR PERITONEAL FLUID

## 2021-10-24 MED ORDER — IPRATROPIUM-ALBUTEROL 0.5-2.5 (3) MG/3ML IN SOLN
3.0000 mL | Freq: Two times a day (BID) | RESPIRATORY_TRACT | Status: DC
  Administered 2021-10-24 – 2021-10-25 (×4): 3 mL via RESPIRATORY_TRACT
  Filled 2021-10-24 (×4): qty 3

## 2021-10-24 MED ORDER — LIDOCAINE HCL 1 % IJ SOLN
INTRAMUSCULAR | Status: AC
  Filled 2021-10-24: qty 10

## 2021-10-24 MED ORDER — IPRATROPIUM-ALBUTEROL 0.5-2.5 (3) MG/3ML IN SOLN: 3.0000 mL | RESPIRATORY_TRACT | Status: DC | PRN

## 2021-10-24 NOTE — Progress Notes (Addendum)
Physical Therapy Treatment Patient Details Name: Rhonda Cook MRN: [id number] DOB: 1945/08/12 Today's Date: 10/24/2021   History of Present Illness Pt is a 76 y.o. F who presents 10/19/2021 with shortness of breath. Patient underwent CXR and CT CAP which showed small right pleural effusion, loculated moderate left pleural effusion, as well as imaging finding concerning for metastatic disease in the chest such as pulmonary nodules, enlarged mediastinal lymph nodes, and asymmetric left paraspinous soft tissue thickening. Patient had  no evidence of metastatic disease in the abdomen or pelvis. S/p thoracentesis 6/22. Pending VATS.  Significant PMH: HTN, COPD, bladder CA.    PT Comments    Pt agreeable to therapy session; pending VATs procedure. Pt is mobilizing fairly well. Ambulating 120 ft with a walker at a min guard assist level. SpO2 89-94% on 2L O2. Pt reports chronic low back and left hip pain; pt with noted LLE weakness in comparison to RLE. Will continue to progress mobility as tolerated.     Recommendations for follow up therapy are one component of a multi-disciplinary discharge planning process, led by the attending physician.  Recommendations may be updated based on patient status, additional functional criteria and insurance authorization.  Follow Up Recommendations  Home health PT     Assistance Recommended at Discharge PRN  Patient can return home with the following A little help with walking and/or transfers;A little help with bathing/dressing/bathroom;Help with stairs or ramp for entrance;Assistance with cooking/housework   Equipment Recommendations  None recommended by PT    Recommendations for Other Services       Precautions / Restrictions Precautions Precautions: Fall Restrictions Weight Bearing Restrictions: No     Mobility  Bed Mobility Overal bed mobility: Needs Assistance Bed Mobility: Supine to Sit, Sit to Supine     Supine to sit: Min assist Sit to  supine: Supervision   General bed mobility comments: Pt seeking HHA to pull up to sitting position    Transfers Overall transfer level: Needs assistance Equipment used: Rolling walker (2 wheels) Transfers: Sit to/from Stand Sit to Stand: Min guard           General transfer comment: Min guard for safety to rise to standing position    Ambulation/Gait Ambulation/Gait assistance: Min guard Gait Distance (Feet): 120 Feet Assistive device: Rolling walker (2 wheels) Gait Pattern/deviations: Step-through pattern, Decreased stride length, Scissoring, Narrow base of support Gait velocity: decreased Gait velocity interpretation: <1.8 ft/sec, indicate of risk for recurrent falls   General Gait Details: Pt with intermittent scissoring; cues for wider BOS, walker proximity and activity pacing   Stairs             Wheelchair Mobility    Modified Rankin (Stroke Patients Only)       Balance Overall balance assessment: Needs assistance Sitting-balance support: Feet supported, No upper extremity supported Sitting balance-Leahy Scale: Good     Standing balance support: Bilateral upper extremity supported Standing balance-Leahy Scale: Poor                              Cognition Arousal/Alertness: Awake/alert Behavior During Therapy: WFL for tasks assessed/performed Overall Cognitive Status: Within Functional Limits for tasks assessed                                          Exercises  General Comments        Pertinent Vitals/Pain Pain Assessment Pain Assessment: Faces Faces Pain Scale: Hurts little more Pain Location: low back, hips (pt reports chronic) Pain Descriptors / Indicators: Sore, Aching Pain Intervention(s): Monitored during session, Limited activity within patient's tolerance    Home Living                          Prior Function            PT Goals (current goals can now be found in the care plan  section) Acute Rehab PT Goals Patient Stated Goal: return home with family to assist Potential to Achieve Goals: Good Progress towards PT goals: Progressing toward goals    Frequency    Min 3X/week      PT Plan Current plan remains appropriate    Co-evaluation              AM-PAC PT "6 Clicks" Mobility   Outcome Measure  Help needed turning from your back to your side while in a flat bed without using bedrails?: None Help needed moving from lying on your back to sitting on the side of a flat bed without using bedrails?: A Little Help needed moving to and from a bed to a chair (including a wheelchair)?: A Little Help needed standing up from a chair using your arms (e.g., wheelchair or bedside chair)?: A Little Help needed to walk in hospital room?: A Little Help needed climbing 3-5 steps with a railing? : A Little 6 Click Score: 19    End of Session Equipment Utilized During Treatment: Gait belt Activity Tolerance: Patient tolerated treatment well Patient left: in bed;with call bell/phone within reach;with family/visitor present Nurse Communication: Mobility status PT Visit Diagnosis: Unsteadiness on feet (R26.81);Other abnormalities of gait and mobility (R26.89);Muscle weakness (generalized) (M62.81)     Time: 1030-1050 PT Time Calculation (min) (ACUTE ONLY): 20 min  Charges:  $Gait Training: 8-22 mins                     Lillia Pauls, PT, DPT Acute Rehabilitation Services Office [phone]    Rhonda Cook 10/24/2021, 11:43 AM

## 2021-10-25 ENCOUNTER — Inpatient Hospital Stay (HOSPITAL_COMMUNITY): Payer: PPO

## 2021-10-25 DIAGNOSIS — R0600 Dyspnea, unspecified: Secondary | ICD-10-CM | POA: Diagnosis not present

## 2021-10-25 DIAGNOSIS — R301 Vesical tenesmus: Secondary | ICD-10-CM

## 2021-10-25 DIAGNOSIS — R0603 Acute respiratory distress: Secondary | ICD-10-CM | POA: Diagnosis not present

## 2021-10-25 DIAGNOSIS — C679 Malignant neoplasm of bladder, unspecified: Secondary | ICD-10-CM | POA: Diagnosis not present

## 2021-10-25 DIAGNOSIS — J90 Pleural effusion, not elsewhere classified: Secondary | ICD-10-CM | POA: Diagnosis not present

## 2021-10-25 HISTORY — PX: IR PERC PLEURAL DRAIN W/INDWELL CATH W/IMG GUIDE: IMG5383

## 2021-10-25 LAB — CULTURE, BODY FLUID W GRAM STAIN -BOTTLE: Culture: NO GROWTH

## 2021-10-25 LAB — GRAM STAIN: Gram Stain: NONE SEEN

## 2021-10-25 LAB — PROTIME-INR
INR: 1.1 (ref 0.8–1.2)
Prothrombin Time: 14.4 seconds (ref 11.4–15.2)

## 2021-10-25 LAB — CBC
HCT: 35.4 % — ABNORMAL LOW (ref 36.0–46.0)
Hemoglobin: 12 g/dL (ref 12.0–15.0)
MCH: 31.2 pg (ref 26.0–34.0)
MCHC: 33.9 g/dL (ref 30.0–36.0)
MCV: 91.9 fL (ref 80.0–100.0)
Platelets: 339 10*3/uL (ref 150–400)
RBC: 3.85 MIL/uL — ABNORMAL LOW (ref 3.87–5.11)
RDW: 13.4 % (ref 11.5–15.5)
WBC: 12.2 10*3/uL — ABNORMAL HIGH (ref 4.0–10.5)
nRBC: 0 % (ref 0.0–0.2)

## 2021-10-25 LAB — CYTOLOGY - NON PAP

## 2021-10-25 MED ORDER — FENTANYL CITRATE (PF) 100 MCG/2ML IJ SOLN
INTRAMUSCULAR | Status: AC | PRN
  Administered 2021-10-25: 25 ug via INTRAVENOUS
  Administered 2021-10-25: 50 ug via INTRAVENOUS
  Administered 2021-10-25: 25 ug via INTRAVENOUS

## 2021-10-25 MED ORDER — FENTANYL CITRATE (PF) 100 MCG/2ML IJ SOLN
INTRAMUSCULAR | Status: AC
  Filled 2021-10-25: qty 2

## 2021-10-25 MED ORDER — MIDAZOLAM HCL 2 MG/2ML IJ SOLN
INTRAMUSCULAR | Status: AC
  Filled 2021-10-25: qty 4

## 2021-10-25 MED ORDER — MIDAZOLAM HCL 2 MG/2ML IJ SOLN
INTRAMUSCULAR | Status: AC | PRN
  Administered 2021-10-25 (×2): .5 mg via INTRAVENOUS
  Administered 2021-10-25: 1 mg via INTRAVENOUS

## 2021-10-25 MED ORDER — LIDOCAINE HCL 1 % IJ SOLN
INTRAMUSCULAR | Status: AC
  Filled 2021-10-25: qty 20

## 2021-10-25 MED ORDER — MIDAZOLAM HCL 2 MG/2ML IJ SOLN
INTRAMUSCULAR | Status: DC | PRN
  Administered 2021-10-25: .5 mg via INTRAVENOUS

## 2021-10-25 NOTE — Care Management Important Message (Signed)
Important Message  Patient Details  Name: Rhonda Cook MRN: [id number] Date of Birth: 06-07-1945   Medicare Important Message Given:  Yes     [name] 10/25/2021, 4:05 PM

## 2021-10-25 NOTE — Progress Notes (Signed)
Mobility Specialist Progress Note:   10/25/21 1025  Mobility  Activity  (bed level exercises)  Range of Motion/Exercises Active;All extremities  Level of Assistance Independent  Assistive Device None  Activity Response Tolerated fair  $Mobility charge 1 Mobility   Pt politely declining OOB mobility at this time d/t "hurting all over" even through pre-medicated. Agreeable to bed level exercises. Tolerated fair. Pt with LLE weakness compared to RLE. Left with all needs met, hopeful for procedure today.   Addison Lank Acute Rehab Secure Chat or Office Phone: [phone]

## 2021-10-26 ENCOUNTER — Inpatient Hospital Stay (HOSPITAL_COMMUNITY): Payer: PPO

## 2021-10-26 DIAGNOSIS — R0600 Dyspnea, unspecified: Secondary | ICD-10-CM | POA: Diagnosis not present

## 2021-10-26 DIAGNOSIS — C679 Malignant neoplasm of bladder, unspecified: Secondary | ICD-10-CM | POA: Diagnosis not present

## 2021-10-26 DIAGNOSIS — R0603 Acute respiratory distress: Secondary | ICD-10-CM | POA: Diagnosis not present

## 2021-10-26 DIAGNOSIS — J90 Pleural effusion, not elsewhere classified: Secondary | ICD-10-CM | POA: Diagnosis not present

## 2021-10-26 LAB — MAGNESIUM: Magnesium: 1.9 mg/dL (ref 1.7–2.4)

## 2021-10-26 LAB — CBC
HCT: 35.9 % — ABNORMAL LOW (ref 36.0–46.0)
Hemoglobin: 12.2 g/dL (ref 12.0–15.0)
MCH: 31 pg (ref 26.0–34.0)
MCHC: 34 g/dL (ref 30.0–36.0)
MCV: 91.3 fL (ref 80.0–100.0)
Platelets: 315 10*3/uL (ref 150–400)
RBC: 3.93 MIL/uL (ref 3.87–5.11)
RDW: 13.5 % (ref 11.5–15.5)
WBC: 13.4 10*3/uL — ABNORMAL HIGH (ref 4.0–10.5)
nRBC: 0 % (ref 0.0–0.2)

## 2021-10-26 LAB — BASIC METABOLIC PANEL
Anion gap: 13 (ref 5–15)
BUN: 21 mg/dL (ref 8–23)
CO2: 27 mmol/L (ref 22–32)
Calcium: 8.8 mg/dL — ABNORMAL LOW (ref 8.9–10.3)
Chloride: 95 mmol/L — ABNORMAL LOW (ref 98–111)
Creatinine, Ser: 0.68 mg/dL (ref 0.44–1.00)
GFR, Estimated: >60 mL/min (ref >60)
Glucose, Bld: 102 mg/dL — ABNORMAL HIGH (ref 70–99)
Potassium: 4.1 mmol/L (ref 3.5–5.1)
Sodium: 135 mmol/L (ref 135–145)

## 2021-10-26 NOTE — Progress Notes (Signed)
Physical Therapy Treatment Patient Details Name: Rhonda Cook MRN: [id number] DOB: October 29, 1945 Today's Date: 10/26/2021   History of Present Illness Pt is a 76 y.o. F who presents 10/19/2021 with shortness of breath. Patient underwent CXR and CT CAP which showed small right pleural effusion, loculated moderate left pleural effusion, as well as imaging finding concerning for metastatic disease in the chest such as pulmonary nodules, enlarged mediastinal lymph nodes, and asymmetric left paraspinous soft tissue thickening. Patient had  no evidence of metastatic disease in the abdomen or pelvis. S/p thoracentesis 6/22. Pending VATS.  Significant PMH: HTN, COPD, bladder CA.    PT Comments    Pt in pain this session but agreeable to attempt mobilization with therapy. Pt able to ambulate 190ft with rollator and min guard. Rollator appears to be a good option for the pt due to adequate dynamic balance and decreased activity tolerance. O2 sats 84% on RA, placed on 2L for ambulation and sats up to 94% aftewrards. Pt would continue to benefit from acute PT to address deficits in strength and activity tolerance.    Recommendations for follow up therapy are one component of a multi-disciplinary discharge planning process, led by the attending physician.  Recommendations may be updated based on patient status, additional functional criteria and insurance authorization.  Follow Up Recommendations  Home health PT     Assistance Recommended at Discharge PRN  Patient can return home with the following A little help with walking and/or transfers;A little help with bathing/dressing/bathroom;Help with stairs or ramp for entrance;Assistance with cooking/housework   Equipment Recommendations  None recommended by PT    Recommendations for Other Services       Precautions / Restrictions Precautions Precautions: Fall Precaution Comments: chest tube, watch O2 Restrictions Weight Bearing Restrictions: No      Mobility  Bed Mobility Overal bed mobility: Needs Assistance Bed Mobility: Supine to Sit, Sit to Supine     Supine to sit: Min assist Sit to supine: Min assist   General bed mobility comments: Requesting trunk assist to sit EOB, Assist for LEs to return to supine    Transfers Overall transfer level: Needs assistance Equipment used: Rollator (4 wheels) Transfers: Sit to/from Stand Sit to Stand: Min guard           General transfer comment: VCs for hand placement, min guard for safety    Ambulation/Gait Ambulation/Gait assistance: Min guard Gait Distance (Feet): 120 Feet Assistive device: Rollator (4 wheels) Gait Pattern/deviations: Step-through pattern, Shuffle Gait velocity: decreased Gait velocity interpretation: 1.31 - 2.62 ft/sec, indicative of limited community ambulator   General Gait Details: cues for rollator safety and break use, pt reporting LE weakness   Stairs             Wheelchair Mobility    Modified Rankin (Stroke Patients Only)       Balance Overall balance assessment: Needs assistance Sitting-balance support: Feet supported, No upper extremity supported Sitting balance-Leahy Scale: Good     Standing balance support: Bilateral upper extremity supported Standing balance-Leahy Scale: Fair Standing balance comment: moderate reliance on rollator                            Cognition Arousal/Alertness: Awake/alert Behavior During Therapy: WFL for tasks assessed/performed Overall Cognitive Status: Within Functional Limits for tasks assessed  Exercises      General Comments General comments (skin integrity, edema, etc.): O2 84% on RA seated EOB, placed on 2L for ambulation, O2 94% after ambulation on 2L      Pertinent Vitals/Pain Pain Assessment Pain Assessment: Faces Faces Pain Scale: Hurts even more Pain Location: chest, stomach Pain Descriptors /  Indicators: Discomfort, Grimacing, Guarding Pain Intervention(s): Limited activity within patient's tolerance, Monitored during session    Home Living                          Prior Function            PT Goals (current goals can now be found in the care plan section) Acute Rehab PT Goals Patient Stated Goal: return home with family to assist PT Goal Formulation: With patient/family Time For Goal Achievement: 11/04/21 Potential to Achieve Goals: Good Progress towards PT goals: Progressing toward goals    Frequency    Min 3X/week      PT Plan Current plan remains appropriate    Co-evaluation              AM-PAC PT "6 Clicks" Mobility   Outcome Measure  Help needed turning from your back to your side while in a flat bed without using bedrails?: None Help needed moving from lying on your back to sitting on the side of a flat bed without using bedrails?: A Little Help needed moving to and from a bed to a chair (including a wheelchair)?: A Little Help needed standing up from a chair using your arms (e.g., wheelchair or bedside chair)?: A Little Help needed to walk in hospital room?: A Little Help needed climbing 3-5 steps with a railing? : A Little 6 Click Score: 19    End of Session Equipment Utilized During Treatment: Gait belt;Oxygen Activity Tolerance: Patient tolerated treatment well Patient left: in bed;with call bell/phone within reach;with family/visitor present Nurse Communication: Mobility status PT Visit Diagnosis: Unsteadiness on feet (R26.81);Other abnormalities of gait and mobility (R26.89);Muscle weakness (generalized) (M62.81)     Time: 2111-7356 PT Time Calculation (min) (ACUTE ONLY): 19 min  Charges:  $Therapeutic Activity: 8-22 mins                     Mackie Pai, SPT Acute Rehabilitation Services  Office: [phone]    Mackie Pai 10/26/2021, 11:20 AM

## 2021-10-26 NOTE — Progress Notes (Signed)
Mobility Specialist Criteria Algorithm Info.   10/26/21 1330  Mobility  Activity Ambulated with assistance in hallway  Range of Motion/Exercises Active;All extremities  Level of Assistance Contact guard assist, steadying assist  Assistive Device Front wheel walker  Distance Ambulated (ft) 120 ft  Activity Response Tolerated well   Patient received in supine agreeable to participate in mobility with encouragement. Was independent for bed mobility and ambulated in hallway min guard with steady gait. Returned to room without complaint or incident. Was left in supine with all needs met, call bell in reach.   10/26/2021 4:02 PM  [place] Jiya Kissinger, [place], [place]  MNARU:[phone] Office: [phone]

## 2021-10-26 NOTE — Progress Notes (Signed)
Referring Physician(s): Dr. Irwin Brakeman  Supervising Physician: Arne Cleveland  Patient Status:  [facility] - In-pt  Chief Complaint: Loculated left pleural effusion  Subjective: Resting comfortably in bed.  Denies drain-related discomfort.  Was able to ambulate in halls.   Allergies: Oxycodone, Adhesive [tape], and Penicillins  Medications: Prior to Admission medications   Medication Sig Start Date End Date Taking? Authorizing Provider  albuterol (PROAIR HFA) 108 (90 Base) MCG/ACT inhaler Inhale 2 puffs into the lungs every 4 (four) hours as needed for wheezing or shortness of breath.  03/26/17  Yes [provider]  albuterol (PROVENTIL) (2.5 MG/3ML) 0.083% nebulizer solution Take 2.5 mg by nebulization every 6 (six) hours as needed for wheezing or shortness of breath.  03/09/17  Yes [provider]  aspirin EC 81 MG tablet Take 1 tablet (81 mg total) by mouth daily. 12/23/15  Yes Rehman, Mechele Dawley, MD  CALCIUM PO Take 1 tablet by mouth daily.   Yes [provider]  celecoxib (CELEBREX) 200 MG capsule Take 200 mg by mouth 2 (two) times daily. 09/20/21  Yes [provider]  cholecalciferol (VITAMIN D3) 25 MCG (1000 UNIT) tablet Take 1,000 Units by mouth daily.   Yes [provider]  docusate sodium (COLACE) 100 MG capsule Take 100 mg by mouth at bedtime.   Yes [provider]  famotidine (PEPCID) 40 MG tablet Take 1 tablet by mouth daily. 09/20/21  Yes [provider]  Ferrous Sulfate (IRON PO) Take 1 tablet by mouth daily.   Yes [provider]  gabapentin (NEURONTIN) 300 MG capsule Take 300 mg by mouth at bedtime. 09/20/21  Yes [provider]  ibuprofen (ADVIL,MOTRIN) 200 MG tablet Take 800 mg by mouth daily as needed for headache or moderate pain.   Yes [provider]  levothyroxine (SYNTHROID, LEVOTHROID) 75 MCG tablet Take 75 mcg by mouth daily.  04/18/18  Yes [provider]   lisinopril-hydrochlorothiazide (PRINZIDE,ZESTORETIC) 10-12.5 MG tablet Take 1 tablet by mouth daily.  04/06/18  Yes [provider]  ondansetron (ZOFRAN) 4 MG tablet Take 1 tablet (4 mg total) by mouth every 6 (six) hours. As needed for nausea and vomiting 10/10/21  Yes Triplett, Tammy, PA-C  Probiotic Product (PROBIOTIC-10 PO) Take 1 capsule by mouth daily.   Yes [provider]  promethazine (PHENERGAN) 12.5 MG tablet Take 12.5 mg by mouth 3 (three) times daily as needed. 10/11/21  Yes [provider]  simvastatin (ZOCOR) 20 MG tablet Take 20 mg by mouth at bedtime.  11/29/15  Yes [provider]  Tetrahydrozoline HCl (VISINE OP) Place 1 drop into both eyes daily as needed (dry eyes).   Yes [provider]     Vital Signs: BP (!) 114/56 (BP Location: Right Arm)   Pulse 93   Temp 97.8 F (36.6 C) (Oral)   Resp 17   Ht 5\' 3"  (1.6 m)   Wt 130 lb 1.1 oz (59 kg)   SpO2 97%   BMI 23.04 kg/m   Physical Exam NAD, alert Chest: left-sided drain in place.  Serosanguinous fluid in Pleurvac, ~600-650 mL. Sealed.  No air leak.   Imaging: DG CHEST PORT 1 VIEW  Result Date: 10/26/2021 CLINICAL DATA:  Left pleural effusion.  Chest tube. EXAM: PORTABLE CHEST 1 VIEW COMPARISON:  Chest radiograph October 20, 2021. FINDINGS: Similar moderate left pleural effusion with left chest tube in place. No visible pneumothorax. Similar small right pleural effusion. Similar overlying bibasilar opacities. Unchanged cardiomediastinal  silhouette, partially obscured. Cholecystectomy clips. IMPRESSION: 1. Similar moderate left pleural effusion with left chest tube in place. No visible pneumothorax. 2. Similar small right pleural effusion. 3. Similar overlying bibasilar atelectasis and/or consolidation. Electronically Signed   By: Margaretha Sheffield M.D.   On: 10/26/2021 08:12   IR PERC PLEURAL DRAIN W/INDWELL CATH W/IMG GUIDE  Result Date: 10/25/2021 INDICATION: 76 year old with  loculated left pleural effusion. Request for chest tube placement. EXAM: PLACEMENT OF LEFT CHEST TUBE WITH ULTRASOUND AND FLUOROSCOPIC GUIDANCE MEDICATIONS: Moderate sedation ANESTHESIA/SEDATION: Moderate (conscious) sedation was employed during this procedure. A total of Versed 2.5 mg and Fentanyl 100 mcg was administered intravenously by the radiology nurse. Total intra-service moderate Sedation Time: 23 minutes. The patient's level of consciousness and vital signs were monitored continuously by radiology nursing throughout the procedure under my direct supervision. COMPLICATIONS: None immediate. PROCEDURE: Informed written consent was obtained from the patient after a thorough discussion of the procedural risks, benefits and alternatives. All questions were addressed.A timeout was performed prior to the initiation of the procedure. Patient was rolled onto her right side. The left mid axillary region was prepped and draped in sterile fashion. Maximal barrier sterile technique was utilized including caps, mask, sterile gowns, sterile gloves, sterile drape, hand hygiene and skin antiseptic. Ultrasound demonstrated a loculated left pleural effusion. Skin was anesthetized with 1% lidocaine. A small incision was made. Using ultrasound guidance, a Yueh catheter was directed into the loculated fluid and dark red pleural fluid was aspirated. Superstiff Amplatz wire was advanced into the pleural space. The tract was dilated to accommodate a 14 [place] multipurpose drain. Additional dark red fluid was collected for analysis. Drain was sutured to skin and attached to a chest tube drainage system. Bandage was placed. Fluoroscopic and ultrasound images were taken and saved for documentation. FINDINGS: Left pleural fluid is very loculated with multiple septations. 30 French drain was advanced into the pleural space and dark red fluid was removed. IMPRESSION: Placement of left chest tube using ultrasound and fluoroscopic  guidance. Left pleural effusion is diffusely loculated. Electronically Signed   By: Markus Daft M.D.   On: 10/25/2021 17:31    Labs:  CBC: Recent Labs    10/10/21 0911 10/19/21 0813 10/25/21 0125 10/26/21 0623  WBC 8.4 8.4 12.2* 13.4*  HGB 12.9 12.1 12.0 12.2  HCT 38.1 36.1 35.4* 35.9*  PLT 400 384 339 315    COAGS: Recent Labs    10/25/21 0125  INR 1.1    BMP: Recent Labs    10/10/21 0911 10/19/21 0813 10/26/21 0623  NA 133* 136 135  K 3.4* 3.6 4.1  CL 97* 100 95*  CO2 26 25 27   GLUCOSE 116* 111* 102*  BUN 12 12 21   CALCIUM 9.3 8.7* 8.8*  CREATININE 0.62 0.49 0.68  GFRNONAA >60 >60 >60    LIVER FUNCTION TESTS: No results for input(s): "BILITOT", "AST", "ALT", "ALKPHOS", "PROT", "ALBUMIN" in the last 8760 hours.  Assessment and Plan: Loculated left pleural effusion s/p chest tube placement 10/25/21 Patient with 600-650 mL sersanguinous fluid in PleurVac. Reports improved WOB, decreased chest tightness.  Was able to ambulate today.  PCCM managing for possible lytics.  IR remains available.   Electronically Signed: Docia Barrier, PA 10/26/2021, 4:51 PM   I spent a total of 15 Minutes at the the patient's bedside AND on the patient's hospital floor or unit, greater than 50% of which was counseling/coordinating care for loculated left pleural effusion.

## 2021-10-26 NOTE — Plan of Care (Signed)
  Problem: Education: Goal: Knowledge of General Education information will improve Description: Including pain rating scale, medication(s)/side effects and non-pharmacologic comfort measures Outcome: Progressing   Problem: Health Behavior/Discharge Planning: Goal: Ability to manage health-related needs will improve Outcome: Progressing   Problem: Clinical Measurements: Goal: Ability to maintain clinical measurements within normal limits will improve Outcome: Progressing Goal: Will remain free from infection Outcome: Progressing Goal: Diagnostic test results will improve Outcome: Progressing Goal: Respiratory complications will improve Outcome: Progressing Goal: Cardiovascular complication will be avoided Outcome: Progressing   Problem: Activity: Goal: Risk for activity intolerance will decrease Outcome: Progressing  Problem: Pain Managment: Goal: General experience of comfort will improve Outcome: Progressing   Problem: Safety: Goal: Ability to remain free from injury will improve Outcome: Progressing   Problem: Education: Goal: Knowledge of disease or condition will improve Outcome: Progressing Goal: Knowledge of the prescribed therapeutic regimen will improve Outcome: Progressing Goal: Individualized Educational Video(s) Outcome: Progressing   Problem: Respiratory: Goal: Ability to maintain a clear airway will improve Outcome: Progressing Goal: Levels of oxygenation will improve Outcome: Progressing Goal: Ability to maintain adequate ventilation will improve Outcome: Progressing   Problem: Coping: Goal: Level of anxiety will decrease Outcome: Progressing

## 2021-10-26 NOTE — Progress Notes (Signed)
PROGRESS NOTE    Rhonda Cook  WUJ:[id number] DOB: 07-31-1945 DOA: 10/19/2021 PCP: [facility]    Brief Narrative:  76 year old female with past medical history of bladder cancer undergoing chemotherapy presented to the hospital with 1 week history of increasing shortness of breath, some cough but no fever or chills.  In the ED, patient was noted to have bilateral pleural effusion and very large left pleural effusion with  mediastinal lymphadenopathy.  There was some concern for paraspinal metastatic disease.  Patient was then considered for admission to the hospital for further evaluation and treatment.    During hospitalization, patient underwent thoracocentesis followed by chest tube placement.  PCCM following at this time  Assessment and Plan: Principal Problem:   Acute respiratory distress Active Problems:   Large loculated presumed malignant pleural effusion on left   Pleural effusion, right   Acute dyspnea   Bladder cancer on chemotherapy   Paraspinal soft tissue mass   Lung nodules   Painful bladder spasm   Acute respiratory distress/dyspnea Likely secondary to COPD exacerbation, bilateral pleural effusion, large left pleural effusion.  IR was consulted and patient underwent ultrasound-guided thoracocentesis with removal of 240 mL of bloody fluid on 10/20/2021.  Pleural fluid culture negative in 5 days from previous thoracocentesis.  Pleural fluid cytology from 10/20/2021 showed atypical cells.    Blood cultures from initial admission negative in 5 days.  IR was again consulted and patient underwent IR guided pleural drain placement on 10/25/2021.    On steroids and bronchodilators.  Communicated with pulmonary Dr. Valeta Harms at bedside..  Continue chest tube management.  Repeat  pleural fluid cytology pending  Large loculated presumed malignant pleural effusion on left Status post thoracocentesis with bloody fluid.  CT scan showed some pleural thickening and  loculation.  Cytology showing atypical cells. Status post IR guided chest tube placement.  Follow PCCM recommendation.  painful bladder spasm Improved.   Paraspinal soft tissue mass Concerning for metastatic disease.  CT scan of the abdomen pelvis 10/19/2021 without any evidence of metastasis.  Bladder cancer on chemotherapy Palliative care has been consulted.  Patient was followed by Dr. Gilford Rile in the past.  Communicated through epic with Dr. Gilford Rile about it.   Pleural effusion, right Small for thoracocentesis.    DVT prophylaxis: enoxaparin (LOVENOX) injection 40 mg Start: 10/20/21 1300 SCDs Start: 10/19/21 1332   Code Status:     Code Status: DNR  Disposition:  Home in 2 to 3 days  Status is: Inpatient  Remains inpatient appropriate because: Status post chest tube placement, closer monitoring,   Family Communication:  Communicated with the patient's husband at bedside  Consultants:  Interventional radiology Pulmonary  Procedures:  Ultrasound-guided thoracocentesis on 10/20/2021 IR guided pleural tube placement on 10/25/2021  Antimicrobials:  None currently  Anti-infectives (From admission, onward)    Start     Dose/Rate Route Frequency Ordered Stop   10/20/21 1000  doxycycline (VIBRA-TABS) tablet 100 mg        100 mg Oral Every 12 hours 10/19/21 1335 10/23/21 2106   10/19/21 1300  levofloxacin (LEVAQUIN) IVPB 750 mg        750 mg 100 mL/hr over 90 Minutes Intravenous  Once 10/19/21 1250 10/19/21 1634      Subjective: Today, patient was seen and examined at bedside.  Patient complains of mild chest discomfort.  No fever chills or rigor.  Has mild cough and shortness of breath.  Patient's husband at bedside.  Objective: Vitals:  10/25/21 2046 10/26/21 0120 10/26/21 0627 10/26/21 0829  BP:  121/78 (!) 110/58 (!) 107/53  Pulse:  89 88 94  Resp:  16 17 17   Temp:  98.4 F (36.9 C) 98.3 F (36.8 C) 97.8 F (36.6 C)  TempSrc:  Oral Oral Oral  SpO2: 97%  97% 96% 92%  Weight:      Height:        Intake/Output Summary (Last 24 hours) at 10/26/2021 1101 Last data filed at 10/26/2021 0829 Gross per 24 hour  Intake 120 ml  Output 650 ml  Net -530 ml   Filed Weights   10/19/21 0817  Weight: 59 kg    Physical Examination:  Body mass index is 23.04 kg/m.   General:  Average built, not in obvious distress, frail, elderly female HENT:   No scleral pallor or icterus noted. Oral mucosa is moist.  Chest:    Diminished breath sounds bilaterally. No crackles or wheezes.  Status post left-sided chest tube placement CVS: S1 &S2 heard. No murmur.  Regular rate and rhythm. Abdomen: Soft, nontender, nondistended.  Bowel sounds are heard.   Extremities: No cyanosis, clubbing or edema.  Peripheral pulses are palpable. Psych: Alert, awake and oriented, normal mood CNS:  No cranial nerve deficits.  Power equal in all extremities.   Skin: Warm and dry.  No rashes noted.  Data Reviewed:   CBC: Recent Labs  Lab 10/25/21 0125 10/26/21 0623  WBC 12.2* 13.4*  HGB 12.0 12.2  HCT 35.4* 35.9*  MCV 91.9 91.3  PLT 339 277    Basic Metabolic Panel: Recent Labs  Lab 10/26/21 0623  NA 135  K 4.1  CL 95*  CO2 27  GLUCOSE 102*  BUN 21  CREATININE 0.68  CALCIUM 8.8*  MG 1.9    Liver Function Tests: No results for input(s): "AST", "ALT", "ALKPHOS", "BILITOT", "PROT", "ALBUMIN" in the last 168 hours.   Radiology Studies: DG CHEST PORT 1 VIEW  Result Date: 10/26/2021 CLINICAL DATA:  Left pleural effusion.  Chest tube. EXAM: PORTABLE CHEST 1 VIEW COMPARISON:  Chest radiograph October 20, 2021. FINDINGS: Similar moderate left pleural effusion with left chest tube in place. No visible pneumothorax. Similar small right pleural effusion. Similar overlying bibasilar opacities. Unchanged cardiomediastinal silhouette, partially obscured. Cholecystectomy clips. IMPRESSION: 1. Similar moderate left pleural effusion with left chest tube in place. No visible  pneumothorax. 2. Similar small right pleural effusion. 3. Similar overlying bibasilar atelectasis and/or consolidation. Electronically Signed   By: Margaretha Sheffield M.D.   On: 10/26/2021 08:12   IR PERC PLEURAL DRAIN W/INDWELL CATH W/IMG GUIDE  Result Date: 10/25/2021 INDICATION: 76 year old with loculated left pleural effusion. Request for chest tube placement. EXAM: PLACEMENT OF LEFT CHEST TUBE WITH ULTRASOUND AND FLUOROSCOPIC GUIDANCE MEDICATIONS: Moderate sedation ANESTHESIA/SEDATION: Moderate (conscious) sedation was employed during this procedure. A total of Versed 2.5 mg and Fentanyl 100 mcg was administered intravenously by the radiology nurse. Total intra-service moderate Sedation Time: 23 minutes. The patient's level of consciousness and vital signs were monitored continuously by radiology nursing throughout the procedure under my direct supervision. COMPLICATIONS: None immediate. PROCEDURE: Informed written consent was obtained from the patient after a thorough discussion of the procedural risks, benefits and alternatives. All questions were addressed.A timeout was performed prior to the initiation of the procedure. Patient was rolled onto her right side. The left mid axillary region was prepped and draped in sterile fashion. Maximal barrier sterile technique was utilized including caps, mask, sterile gowns, sterile gloves, sterile  drape, hand hygiene and skin antiseptic. Ultrasound demonstrated a loculated left pleural effusion. Skin was anesthetized with 1% lidocaine. A small incision was made. Using ultrasound guidance, a Yueh catheter was directed into the loculated fluid and dark red pleural fluid was aspirated. Superstiff Amplatz wire was advanced into the pleural space. The tract was dilated to accommodate a 14 [place] multipurpose drain. Additional dark red fluid was collected for analysis. Drain was sutured to skin and attached to a chest tube drainage system. Bandage was placed. Fluoroscopic  and ultrasound images were taken and saved for documentation. FINDINGS: Left pleural fluid is very loculated with multiple septations. 61 French drain was advanced into the pleural space and dark red fluid was removed. IMPRESSION: Placement of left chest tube using ultrasound and fluoroscopic guidance. Left pleural effusion is diffusely loculated. Electronically Signed   By: Markus Daft M.D.   On: 10/25/2021 17:31      LOS: 7 days    Flora Lipps, MD [facility] Available via Epic secure chat 7am-7pm After these hours, please refer to coverage provider listed on [email or web address] 10/26/2021, 11:01 AM

## 2021-10-26 NOTE — Progress Notes (Signed)
NAME:  Rhonda Cook, MRN:  [id number], DOB:  February 19, 1946, LOS: 7 ADMISSION DATE:  10/19/2021, CONSULTATION DATE:  10/20/21  REFERRING MD:  Wynetta Emery, [facility], CHIEF COMPLAINT:  dyspnea    History of Present Illness:  76 year old female  active smoker   with bladder cancer treated by Dr. Lovena Neighbours with urology undergoing regular chemotherapy treatments most recently 1 week PTA presented to the emergency department with several days of increasing shortness of breath assoc dry cough but denies fever.  She does have chills.  She was evaluated in the emergency department and found to have bilateral pleural effusions with findings of a very large left pleural effusion.  She also had evidence of mediastinal adenopathy.   PCCM service contacted am 6/22 re management sob and effusions. Dry cough worse since tap/ no cp  Significant Hospital Events: Including procedures, antibiotic start and stop dates in addition to other pertinent events   Left T centesis  6/22 x 240 cc  bloody - exudate with nl glucose /  cytology >>> atypical cells and lymphocyte predominate    Scheduled Meds:  aspirin EC  81 mg Oral Daily   cloNIDine  0.1 mg Oral TID   docusate sodium  100 mg Oral QHS   enoxaparin (LOVENOX) injection  40 mg Subcutaneous Q24H   famotidine  40 mg Oral Daily   gabapentin  300 mg Oral QHS   guaiFENesin  1,200 mg Oral BID   hydrochlorothiazide  12.5 mg Oral Daily   levothyroxine  75 mcg Oral Q0600   methylPREDNISolone (SOLU-MEDROL) injection  40 mg Intravenous Q24H   pantoprazole  40 mg Oral QAC breakfast   polyethylene glycol  17 g Oral Daily   simvastatin  20 mg Oral QHS   Continuous Infusions:  lactated ringers 10 mL/hr at 10/21/21 1641   PRN Meds:.acetaminophen **OR** acetaminophen, bisacodyl, HYDROcodone-acetaminophen, HYDROmorphone (DILAUDID) injection, ipratropium-albuterol, midazolam, ondansetron **OR** ondansetron (ZOFRAN) IV, traZODone    Interim History / Subjective:   Tired. Not  sleeping well. Wants to go home. Some pain at tube insertion site.   Objective   Blood pressure (!) 110/58, pulse 88, temperature 98.3 F (36.8 C), temperature source Oral, resp. rate 17, height 5\' 3"  (1.6 m), weight 59 kg, SpO2 96 %.        Intake/Output Summary (Last 24 hours) at 10/26/2021 0824 Last data filed at 10/26/2021 4742 Gross per 24 hour  Intake --  Output 650 ml  Net -650 ml   Filed Weights   10/19/21 0817  Weight: 59 kg    Examination: General appearance: 76 y.o., female, NAD, conversant  Eyes: anicteric sclerae, moist conjunctivae; no lid-lag; PERRLA, tracking appropriately HENT: NCAT; oropharynx, MMM, no mucosal ulcerations; normal hard and soft palate Neck: Trachea midline; FROM, supple, lymphadenopathy, no JVD Lungs: diminished in BL bases  CV: RRR, S1, S2, no MRGs  Skin: Normal temperature, turgor and texture; no rash Neuro: Alert and oriented to person and place, no focal deficit    CXR: loculated left sided pleural effusion, right small effusion  The patient's images have been independently reviewed by me.    Assessment & Plan:   Bilateral pleural effusions, exudative, lymphocyte predominate, cytology X1 atypical cells  - likely malignant effusions CT chest with mediastinal adenopathy  Paraspinal soft tissue density  Known Bladder Cancer, likely now with metastatic disease  Former smoker, quit recently, possible component of COPD P: Chest was already placed by IR  Will let effusion drain as much as possible No indication  for intrapleural lytic therapy  She may need palliative pleurodesis or IPC placement if effusions recur quickly     Garner Nash, DO [facility] 10/26/2021 8:24 AM

## 2021-10-27 DIAGNOSIS — J90 Pleural effusion, not elsewhere classified: Secondary | ICD-10-CM | POA: Diagnosis not present

## 2021-10-27 DIAGNOSIS — C679 Malignant neoplasm of bladder, unspecified: Secondary | ICD-10-CM | POA: Diagnosis not present

## 2021-10-27 DIAGNOSIS — R0603 Acute respiratory distress: Secondary | ICD-10-CM | POA: Diagnosis not present

## 2021-10-27 DIAGNOSIS — R0600 Dyspnea, unspecified: Secondary | ICD-10-CM | POA: Diagnosis not present

## 2021-10-27 LAB — BASIC METABOLIC PANEL
Anion gap: 11 (ref 5–15)
BUN: 17 mg/dL (ref 8–23)
CO2: 25 mmol/L (ref 22–32)
Calcium: 8.7 mg/dL — ABNORMAL LOW (ref 8.9–10.3)
Chloride: 97 mmol/L — ABNORMAL LOW (ref 98–111)
Creatinine, Ser: 0.52 mg/dL (ref 0.44–1.00)
GFR, Estimated: >60 mL/min (ref >60)
Glucose, Bld: 110 mg/dL — ABNORMAL HIGH (ref 70–99)
Potassium: 4.5 mmol/L (ref 3.5–5.1)
Sodium: 133 mmol/L — ABNORMAL LOW (ref 135–145)

## 2021-10-27 LAB — MAGNESIUM: Magnesium: 1.9 mg/dL (ref 1.7–2.4)

## 2021-10-27 LAB — CD19 AND CD20, FLOW CYTOMETRY

## 2021-10-27 LAB — CBC
HCT: 34.9 % — ABNORMAL LOW (ref 36.0–46.0)
Hemoglobin: 11.7 g/dL — ABNORMAL LOW (ref 12.0–15.0)
MCH: 30.7 pg (ref 26.0–34.0)
MCHC: 33.5 g/dL (ref 30.0–36.0)
MCV: 91.6 fL (ref 80.0–100.0)
Platelets: 308 10*3/uL (ref 150–400)
RBC: 3.81 MIL/uL — ABNORMAL LOW (ref 3.87–5.11)
RDW: 13.3 % (ref 11.5–15.5)
WBC: 12.6 10*3/uL — ABNORMAL HIGH (ref 4.0–10.5)
nRBC: 0 % (ref 0.0–0.2)

## 2021-10-27 LAB — CYTOLOGY - NON PAP

## 2021-10-27 MED ORDER — BISACODYL 10 MG RE SUPP
10.0000 mg | Freq: Every day | RECTAL | Status: DC
  Administered 2021-10-27: 10 mg via RECTAL
  Filled 2021-10-27: qty 1

## 2021-10-27 MED ORDER — ORAL CARE MOUTH RINSE: 15.0000 mL | OROMUCOSAL | Status: DC | PRN

## 2021-10-27 NOTE — H&P (View-Only) (Signed)
NAME:  Rhonda Cook, MRN:  [id number], DOB:  [birth year], LOS: 8 ADMISSION DATE:  10/19/2021, CONSULTATION DATE:  10/20/21  REFERRING MD:  Wynetta Emery, [facility], CHIEF COMPLAINT:  dyspnea    History of Present Illness:  76 year old female  active smoker   with bladder cancer treated by Dr. Lovena Neighbours with urology undergoing regular chemotherapy treatments most recently 1 week PTA presented to the emergency department with several days of increasing shortness of breath assoc dry cough but denies fever.  She does have chills.  She was evaluated in the emergency department and found to have bilateral pleural effusions with findings of a very large left pleural effusion.  She also had evidence of mediastinal adenopathy.   PCCM service contacted am 6/22 re management sob and effusions. Dry cough worse since tap/ no cp  Significant Hospital Events: Including procedures, antibiotic start and stop dates in addition to other pertinent events   Left T centesis  6/22 x 240 cc  bloody - exudate with nl glucose /  cytology >>> atypical cells and lymphocyte predominate    Scheduled Meds:  aspirin EC  81 mg Oral Daily   bisacodyl  10 mg Rectal Daily   cloNIDine  0.1 mg Oral TID   docusate sodium  100 mg Oral QHS   enoxaparin (LOVENOX) injection  40 mg Subcutaneous Q24H   famotidine  40 mg Oral Daily   gabapentin  300 mg Oral QHS   hydrochlorothiazide  12.5 mg Oral Daily   levothyroxine  75 mcg Oral Q0600   methylPREDNISolone (SOLU-MEDROL) injection  40 mg Intravenous Q24H   pantoprazole  40 mg Oral QAC breakfast   polyethylene glycol  17 g Oral Daily   simvastatin  20 mg Oral QHS   Continuous Infusions:  lactated ringers 10 mL/hr at 10/21/21 1641   PRN Meds:.acetaminophen **OR** acetaminophen, HYDROcodone-acetaminophen, HYDROmorphone (DILAUDID) injection, ipratropium-albuterol, midazolam, ondansetron **OR** ondansetron (ZOFRAN) IV, mouth rinse, traZODone    Interim History / Subjective:   Still sore in the  left chest. Chest tube draining well.   Objective   Blood pressure 98/69, pulse 84, temperature 97.8 F (36.6 C), temperature source Oral, resp. rate 17, height 5\' 3"  (1.6 m), weight 59 kg, SpO2 94 %.        Intake/Output Summary (Last 24 hours) at 10/27/2021 1026 Last data filed at 10/27/2021 0600 Gross per 24 hour  Intake 595 ml  Output 30 ml  Net 565 ml   Filed Weights   10/19/21 0817  Weight: 59 kg    Examination: General appearance: 76 y.o., female, NAD, conversant  Eyes: anicteric sclerae, moist conjunctivae; no lid-lag; PERRLA, tracking appropriately HENT: NCAT; oropharynx, MMM Neck: Trachea midline; FROM, supple, lymphadenopathy, no JVD Lungs: diminished in the left base  CV: RRR, S1, S2, no MRGs  Abdomen: Soft, non-tender; non-distended, BS present  Extremities: No peripheral edema,  Skin: Normal temperature, turgor and texture; no rash Psych: Appropriate affect Neuro: Alert and oriented to person and place, no focal deficit    CXR: loculated left effusion, small right effusion   Assessment & Plan:   Bilateral pleural effusions, exudative, lymphocyte predominate, cytology X1 atypical cells  - likely malignant effusions CT chest with mediastinal adenopathy  Paraspinal soft tissue density  Known Bladder Cancer, likely now with metastatic disease  Former smoker, quit recently, possible component of COPD P: CT still draining  Too much to consider pulling  CT on -20cmH20 Follow output once less than 200ish per day then we can consider  removing    [facility] 10/27/2021 10:26 AM

## 2021-10-27 NOTE — Progress Notes (Signed)
Pt chest tube is making suction noises, notified MD, discontinue suction and remove dressing at this time. Notified charge RN and rapid response. Bp taking and chest tube mark for measurment.

## 2021-10-27 NOTE — Progress Notes (Signed)
NAME:  Rhonda Cook, MRN:  [id number], DOB:  1945/07/20, LOS: 8 ADMISSION DATE:  10/19/2021, CONSULTATION DATE:  10/20/21  REFERRING MD:  Wynetta Emery, [facility], CHIEF COMPLAINT:  dyspnea    History of Present Illness:  76 year old female  active smoker   with bladder cancer treated by Dr. Lovena Neighbours with urology undergoing regular chemotherapy treatments most recently 1 week PTA presented to the emergency department with several days of increasing shortness of breath assoc dry cough but denies fever.  She does have chills.  She was evaluated in the emergency department and found to have bilateral pleural effusions with findings of a very large left pleural effusion.  She also had evidence of mediastinal adenopathy.   PCCM service contacted am 6/22 re management sob and effusions. Dry cough worse since tap/ no cp  Significant Hospital Events: Including procedures, antibiotic start and stop dates in addition to other pertinent events   Left T centesis  6/22 x 240 cc  bloody - exudate with nl glucose /  cytology >>> atypical cells and lymphocyte predominate    Scheduled Meds:  aspirin EC  81 mg Oral Daily   bisacodyl  10 mg Rectal Daily   cloNIDine  0.1 mg Oral TID   docusate sodium  100 mg Oral QHS   enoxaparin (LOVENOX) injection  40 mg Subcutaneous Q24H   famotidine  40 mg Oral Daily   gabapentin  300 mg Oral QHS   hydrochlorothiazide  12.5 mg Oral Daily   levothyroxine  75 mcg Oral Q0600   methylPREDNISolone (SOLU-MEDROL) injection  40 mg Intravenous Q24H   pantoprazole  40 mg Oral QAC breakfast   polyethylene glycol  17 g Oral Daily   simvastatin  20 mg Oral QHS   Continuous Infusions:  lactated ringers 10 mL/hr at 10/21/21 1641   PRN Meds:.acetaminophen **OR** acetaminophen, HYDROcodone-acetaminophen, HYDROmorphone (DILAUDID) injection, ipratropium-albuterol, midazolam, ondansetron **OR** ondansetron (ZOFRAN) IV, mouth rinse, traZODone    Interim History / Subjective:   Still sore in the  left chest. Chest tube draining well.   Objective   Blood pressure 98/69, pulse 84, temperature 97.8 F (36.6 C), temperature source Oral, resp. rate 17, height 5\' 3"  (1.6 m), weight 59 kg, SpO2 94 %.        Intake/Output Summary (Last 24 hours) at 10/27/2021 1026 Last data filed at 10/27/2021 0600 Gross per 24 hour  Intake 595 ml  Output 30 ml  Net 565 ml   Filed Weights   10/19/21 0817  Weight: 59 kg    Examination: General appearance: 76 y.o., female, NAD, conversant  Eyes: anicteric sclerae, moist conjunctivae; no lid-lag; PERRLA, tracking appropriately HENT: NCAT; oropharynx, MMM Neck: Trachea midline; FROM, supple, lymphadenopathy, no JVD Lungs: diminished in the left base  CV: RRR, S1, S2, no MRGs  Abdomen: Soft, non-tender; non-distended, BS present  Extremities: No peripheral edema,  Skin: Normal temperature, turgor and texture; no rash Psych: Appropriate affect Neuro: Alert and oriented to person and place, no focal deficit    CXR: loculated left effusion, small right effusion   Assessment & Plan:   Bilateral pleural effusions, exudative, lymphocyte predominate, cytology X1 atypical cells  - likely malignant effusions CT chest with mediastinal adenopathy  Paraspinal soft tissue density  Known Bladder Cancer, likely now with metastatic disease  Former smoker, quit recently, possible component of COPD P: CT still draining  Too much to consider pulling  CT on -20cmH20 Follow output once less than 200ish per day then we can consider  removing    [facility] 10/27/2021 10:26 AM

## 2021-10-27 NOTE — Progress Notes (Signed)
Mobility Specialist Progress Note:   10/27/21 1525  Mobility  Activity Ambulated with assistance in hallway  Level of Assistance Contact guard assist, steadying assist  Assistive Device Front wheel walker  Distance Ambulated (ft) 195 ft  Activity Response Tolerated well  $Mobility charge 1 Mobility   Pt agreeable to mobility session. Required min guard assist throughout. Pt with moderate pain from chest tube insertion throughout session. SpO2 >90% on RA throughout. Once pt back in bed, states she is SOB d/t pain, SpO2 89%. Pt placed on 2LO2 for comfort.   Nelta Numbers Acute Rehab Secure Chat or Office Phone: [phone]

## 2021-10-27 NOTE — Progress Notes (Signed)
PROGRESS NOTE    Rhonda Cook  GYI:[id number] DOB: 26-Feb-1946 DOA: 10/19/2021 PCP: [facility]    Brief Narrative:  76 year old female with past medical history of bladder cancer undergoing chemotherapy presented to the hospital with 1 week history of increasing shortness of breath, some cough but no fever or chills.  In the ED, patient was noted to have bilateral pleural effusion and very large left pleural effusion with  mediastinal lymphadenopathy.  There was some concern for paraspinal metastatic disease.  Patient was then considered for admission to the hospital for further evaluation and treatment.    During hospitalization, patient underwent thoracocentesis followed by chest tube placement.  PCCM following at this time.  Assessment and Plan: Principal Problem:   Acute respiratory distress Active Problems:   Large loculated presumed malignant pleural effusion on left   Pleural effusion, right   Acute dyspnea   Bladder cancer on chemotherapy   Paraspinal soft tissue mass   Lung nodules   Painful bladder spasm   Acute respiratory distress/dyspnea Likely secondary to COPD exacerbation, bilateral pleural effusion, large left pleural effusion.  IR was consulted and patient underwent ultrasound-guided thoracocentesis with removal of 240 mL of bloody fluid on 10/20/2021.  Pleural fluid culture negative from previous thoracocentesis.  On 10/20/2021 which showed atypical cells.    Blood cultures negative.  IR was again consulted and patient underwent IR guided pleural drain placement on 10/25/2021.    On IV steroids and bronchodilators.  Continue chest tube management as per pulmonary.  Repeat  pleural fluid cytology pending.  Add incentive spirometry.  Currently on room air.  We will continue to monitor.  Large loculated likely malignant pleural effusion on left Status post thoracocentesis with bloody fluid.  CT scan showed some pleural thickening and loculation.   Patient cytology showing atypical cells. Status post IR guided chest tube placement.  Follow PCCM recommendation.  Paraspinal soft tissue mass Concerning for metastatic disease.  CT scan of the abdomen pelvis 10/19/2021 without any evidence of metastasis.  CT chest with paraspinal soft tissue.  History of bladder cancer and chemotherapy with  Bladder cancer on chemotherapy Palliative care has been consulted.  Patient was followed by Dr. Gilford Rile in the past.  Communicated through epic with Dr. Gilford Rile about it.   Pleural effusion, right Small for thoracocentesis.    DVT prophylaxis: enoxaparin (LOVENOX) injection 40 mg Start: 10/20/21 1300 SCDs Start: 10/19/21 1332   Code Status:     Code Status: DNR  Disposition:  Home in 2 to 3 days  Status is: Inpatient  Remains inpatient appropriate because: Status post chest tube placement   Family Communication:  Communicated with the patient's husband at bedside on 10/26/2021  Consultants:  Interventional radiology Pulmonary  Procedures:  Ultrasound-guided thoracocentesis on 10/20/2021 IR guided pleural tube placement on 10/25/2021  Antimicrobials:  None currently  Anti-infectives (From admission, onward)    Start     Dose/Rate Route Frequency Ordered Stop   10/20/21 1000  doxycycline (VIBRA-TABS) tablet 100 mg        100 mg Oral Every 12 hours 10/19/21 1335 10/23/21 2106   10/19/21 1300  levofloxacin (LEVAQUIN) IVPB 750 mg        750 mg 100 mL/hr over 90 Minutes Intravenous  Once 10/19/21 1250 10/19/21 1634      Subjective: Today, patient was seen and examined at bedside.  [place] as her breathing has improved but has some sharp pleuritic chest pain when taking a long deep breath.  Objective: Vitals:   10/26/21 1720 10/26/21 2012 10/27/21 0611 10/27/21 0741  BP: 105/67 116/65 (!) 156/55 98/69  Pulse: 84 82 80 84  Resp: 18 18 20 17   Temp: 97.8 F (36.6 C) 98 F (36.7 C) 97.6 F (36.4 C) 97.8 F (36.6 C)  TempSrc:  Oral Oral Oral Oral  SpO2: 97% 98% 94% 94%  Weight:      Height:        Intake/Output Summary (Last 24 hours) at 10/27/2021 1259 Last data filed at 10/27/2021 0600 Gross per 24 hour  Intake 595 ml  Output 30 ml  Net 565 ml   Filed Weights   10/19/21 0817  Weight: 59 kg    Physical Examination:  Body mass index is 23.04 kg/m.   General:  Average built, not in obvious distress, elderly female, Communicative, HENT:   No scleral pallor or icterus noted. Oral mucosa is moist.  Chest:   Diminished breath sounds bilaterally chest tube in the left side.  Diminished breath sounds left base. CVS: S1 &S2 heard. No murmur.  Regular rate and rhythm. Abdomen: Soft, nontender, nondistended.  Bowel sounds are heard.   Extremities: No cyanosis, clubbing or edema.  Peripheral pulses are palpable. Psych: Alert, awake and oriented, normal mood CNS:  No cranial nerve deficits.  Power equal in all extremities.   Skin: Warm and dry.  No rashes noted.  Data Reviewed:   CBC: Recent Labs  Lab 10/25/21 0125 10/26/21 0623 10/27/21 0145  WBC 12.2* 13.4* 12.6*  HGB 12.0 12.2 11.7*  HCT 35.4* 35.9* 34.9*  MCV 91.9 91.3 91.6  PLT 339 315 456    Basic Metabolic Panel: Recent Labs  Lab 10/26/21 0623 10/27/21 0145  NA 135 133*  K 4.1 4.5  CL 95* 97*  CO2 27 25  GLUCOSE 102* 110*  BUN 21 17  CREATININE 0.68 0.52  CALCIUM 8.8* 8.7*  MG 1.9 1.9    Liver Function Tests: No results for input(s): "AST", "ALT", "ALKPHOS", "BILITOT", "PROT", "ALBUMIN" in the last 168 hours.   Radiology Studies: DG CHEST PORT 1 VIEW  Result Date: 10/26/2021 CLINICAL DATA:  Left pleural effusion.  Chest tube. EXAM: PORTABLE CHEST 1 VIEW COMPARISON:  Chest radiograph October 20, 2021. FINDINGS: Similar moderate left pleural effusion with left chest tube in place. No visible pneumothorax. Similar small right pleural effusion. Similar overlying bibasilar opacities. Unchanged cardiomediastinal silhouette, partially  obscured. Cholecystectomy clips. IMPRESSION: 1. Similar moderate left pleural effusion with left chest tube in place. No visible pneumothorax. 2. Similar small right pleural effusion. 3. Similar overlying bibasilar atelectasis and/or consolidation. Electronically Signed   By: Margaretha Sheffield M.D.   On: 10/26/2021 08:12      LOS: 8 days    Flora Lipps, MD [facility] Available via Epic secure chat 7am-7pm After these hours, please refer to coverage provider listed on [email or web address] 10/27/2021, 12:59 PM

## 2021-10-27 NOTE — Plan of Care (Signed)
  Problem: Clinical Measurements: Goal: Respiratory complications will improve Outcome: Not Progressing   Problem: Coping: Goal: Level of anxiety will decrease Outcome: Not Progressing   Problem: Elimination: Goal: Will not experience complications related to bowel motility Outcome: Not Progressing   Problem: Coping: Goal: Level of anxiety will decrease Outcome: Not Progressing   Problem: Pain Managment: Goal: General experience of comfort will improve Outcome: Not Progressing

## 2021-10-27 NOTE — Progress Notes (Signed)
Mobility Specialist Progress Note:   10/27/21 1500  Mobility  Activity Transferred to/from [facility]  Level of Assistance Standby assist, set-up cues, supervision of patient - no hands on  Assistive Device None  Distance Ambulated (ft) 2 ft  Activity Response Tolerated well  $Mobility charge 1 Mobility   Pt received on BSC. Required no physical assistance to transfer to bed. Pt declining ambulation d/t BM frequency. Will f/u for ambulation.   Nelta Numbers Acute Rehab Secure Chat or Office Phone: [phone]

## 2021-10-27 NOTE — Progress Notes (Signed)
SATURATION QUALIFICATIONS: (This note is used to comply with regulatory documentation for home oxygen)  Patient Saturations on Room Air at Rest = 91%  Patient Saturations on Room Air while Ambulating = 94%  Patient Saturations on N/A Liters of oxygen while Ambulating = N/A%  Please briefly explain why patient needs home oxygen: N/A  [place] or Office Phone: [phone]

## 2021-10-27 NOTE — Progress Notes (Signed)
Place chest tube to suction per MD

## 2021-10-27 NOTE — Progress Notes (Signed)
PCCM:  NPO midnight Plans for bronchoscopy tomorrow   Garner Nash, DO [facility] 10/27/2021 6:11 PM

## 2021-10-27 NOTE — Progress Notes (Signed)
Referring Physician(s): Irwin Brakeman   Supervising Physician: Corrie Mckusick  Patient Status:  [facility] - In-pt  Chief Complaint:  F/U Chest tube placement.  Brief History:  Rhonda Cook is a 76 y.o. female with PMHs of HTN, COPD, and bladder CA s/p TURBT in 2020 who presented to [facility] ED on 6/21 due to shortness of breath.    She underwent CXR and CT CAP which showed small right pleural effusion, loculated moderate left pleural effusion, as well as imaging finding concerning for metastatic disease in the chest such as pulmonary nodules, enlarged mediastinal lymph nodes, and asymmetric left paraspinous soft tissue thickening. Patient had  no evidence of metastatic disease in the abdomen or pelvis.   She underwent thoracentesis with IR on 6/22 which yielded 240 cc of bloody fluid.  Culture and gram stain did not show any organisms, and cytology is pending.  Subjective:  Doing ok. No complaints.  Allergies: Oxycodone, Adhesive [tape], and Penicillins  Medications: Prior to Admission medications   Medication Sig Start Date End Date Taking? Authorizing Provider  albuterol (PROAIR HFA) 108 (90 Base) MCG/ACT inhaler Inhale 2 puffs into the lungs every 4 (four) hours as needed for wheezing or shortness of breath.  03/26/17  Yes [provider]  albuterol (PROVENTIL) (2.5 MG/3ML) 0.083% nebulizer solution Take 2.5 mg by nebulization every 6 (six) hours as needed for wheezing or shortness of breath.  03/09/17  Yes [provider]  aspirin EC 81 MG tablet Take 1 tablet (81 mg total) by mouth daily. 12/23/15  Yes Rehman, Mechele Dawley, MD  CALCIUM PO Take 1 tablet by mouth daily.   Yes [provider]  celecoxib (CELEBREX) 200 MG capsule Take 200 mg by mouth 2 (two) times daily. 09/20/21  Yes [provider]  cholecalciferol (VITAMIN D3) 25 MCG (1000 UNIT) tablet Take 1,000 Units by mouth daily.   Yes [provider]  docusate sodium (COLACE) 100 MG  capsule Take 100 mg by mouth at bedtime.   Yes [provider]  famotidine (PEPCID) 40 MG tablet Take 1 tablet by mouth daily. 09/20/21  Yes [provider]  Ferrous Sulfate (IRON PO) Take 1 tablet by mouth daily.   Yes [provider]  gabapentin (NEURONTIN) 300 MG capsule Take 300 mg by mouth at bedtime. 09/20/21  Yes [provider]  ibuprofen (ADVIL,MOTRIN) 200 MG tablet Take 800 mg by mouth daily as needed for headache or moderate pain.   Yes [provider]  levothyroxine (SYNTHROID, LEVOTHROID) 75 MCG tablet Take 75 mcg by mouth daily.  04/18/18  Yes [provider]  lisinopril-hydrochlorothiazide (PRINZIDE,ZESTORETIC) 10-12.5 MG tablet Take 1 tablet by mouth daily.  04/06/18  Yes [provider]  ondansetron (ZOFRAN) 4 MG tablet Take 1 tablet (4 mg total) by mouth every 6 (six) hours. As needed for nausea and vomiting 10/10/21  Yes Triplett, Tammy, PA-C  Probiotic Product (PROBIOTIC-10 PO) Take 1 capsule by mouth daily.   Yes [provider]  promethazine (PHENERGAN) 12.5 MG tablet Take 12.5 mg by mouth 3 (three) times daily as needed. 10/11/21  Yes [provider]  simvastatin (ZOCOR) 20 MG tablet Take 20 mg by mouth at bedtime.  11/29/15  Yes [provider]  Tetrahydrozoline HCl (VISINE OP) Place 1 drop into both eyes daily as needed (dry eyes).   Yes [provider]     Vital Signs: BP 98/69 (BP Location: Right Arm)   Pulse 84   Temp 97.8  F (36.6 C) (Oral)   Resp 17   Ht 5\' 3"  (1.6 m)   Wt 130 lb 1.1 oz (59 kg)   SpO2 94%   BMI 23.04 kg/m   Physical Exam Vitals reviewed.  Constitutional:      Appearance: Normal appearance.  Cardiovascular:     Rate and Rhythm: Normal rate.  Pulmonary:     Effort: Pulmonary effort is normal. No respiratory distress.  Abdominal:     Palpations: Abdomen is soft.  Neurological:     General: No focal deficit present.     Mental Status: She is  alert and oriented to person, place, and time.  Psychiatric:        Mood and Affect: Mood normal.        Behavior: Behavior normal.        Thought Content: Thought content normal.        Judgment: Judgment normal.   Left chest tube remains in placed.  Clear red drainage in pleurevac and in tubing. Remains to suction. No air leak. 80 mL output recorded however there appears to be considerably more than that in her pleurevac.   Imaging: DG CHEST PORT 1 VIEW  Result Date: 10/26/2021 CLINICAL DATA:  Left pleural effusion.  Chest tube. EXAM: PORTABLE CHEST 1 VIEW COMPARISON:  Chest radiograph October 20, 2021. FINDINGS: Similar moderate left pleural effusion with left chest tube in place. No visible pneumothorax. Similar small right pleural effusion. Similar overlying bibasilar opacities. Unchanged cardiomediastinal silhouette, partially obscured. Cholecystectomy clips. IMPRESSION: 1. Similar moderate left pleural effusion with left chest tube in place. No visible pneumothorax. 2. Similar small right pleural effusion. 3. Similar overlying bibasilar atelectasis and/or consolidation. Electronically Signed   By: Margaretha Sheffield M.D.   On: 10/26/2021 08:12   IR PERC PLEURAL DRAIN W/INDWELL CATH W/IMG GUIDE  Result Date: 10/25/2021 INDICATION: 76 year old with loculated left pleural effusion. Request for chest tube placement. EXAM: PLACEMENT OF LEFT CHEST TUBE WITH ULTRASOUND AND FLUOROSCOPIC GUIDANCE MEDICATIONS: Moderate sedation ANESTHESIA/SEDATION: Moderate (conscious) sedation was employed during this procedure. A total of Versed 2.5 mg and Fentanyl 100 mcg was administered intravenously by the radiology nurse. Total intra-service moderate Sedation Time: 23 minutes. The patient's level of consciousness and vital signs were monitored continuously by radiology nursing throughout the procedure under my direct supervision. COMPLICATIONS: None immediate. PROCEDURE: Informed written consent was obtained from  the patient after a thorough discussion of the procedural risks, benefits and alternatives. All questions were addressed.A timeout was performed prior to the initiation of the procedure. Patient was rolled onto her right side. The left mid axillary region was prepped and draped in sterile fashion. Maximal barrier sterile technique was utilized including caps, mask, sterile gowns, sterile gloves, sterile drape, hand hygiene and skin antiseptic. Ultrasound demonstrated a loculated left pleural effusion. Skin was anesthetized with 1% lidocaine. A small incision was made. Using ultrasound guidance, a Yueh catheter was directed into the loculated fluid and dark red pleural fluid was aspirated. Superstiff Amplatz wire was advanced into the pleural space. The tract was dilated to accommodate a 14 [place] multipurpose drain. Additional dark red fluid was collected for analysis. Drain was sutured to skin and attached to a chest tube drainage system. Bandage was placed. Fluoroscopic and ultrasound images were taken and saved for documentation. FINDINGS: Left pleural fluid is very loculated with multiple septations. 61 French drain was advanced into the pleural space and dark red fluid was removed. IMPRESSION: Placement of left chest tube using ultrasound and  fluoroscopic guidance. Left pleural effusion is diffusely loculated. Electronically Signed   By: Markus Daft M.D.   On: 10/25/2021 17:31    Labs:  CBC: Recent Labs    10/19/21 0813 10/25/21 0125 10/26/21 0623 10/27/21 0145  WBC 8.4 12.2* 13.4* 12.6*  HGB 12.1 12.0 12.2 11.7*  HCT 36.1 35.4* 35.9* 34.9*  PLT 384 339 315 308    COAGS: Recent Labs    10/25/21 0125  INR 1.1    BMP: Recent Labs    10/10/21 0911 10/19/21 0813 10/26/21 0623 10/27/21 0145  NA 133* 136 135 133*  K 3.4* 3.6 4.1 4.5  CL 97* 100 95* 97*  CO2 26 25 27 25   GLUCOSE 116* 111* 102* 110*  BUN 12 12 21 17   CALCIUM 9.3 8.7* 8.8* 8.7*  CREATININE 0.62 0.49 0.68 0.52   GFRNONAA >60 >60 >60 >60    LIVER FUNCTION TESTS: No results for input(s): "BILITOT", "AST", "ALT", "ALKPHOS", "PROT", "ALBUMIN" in the last 8760 hours.  Assessment and Plan:  Presumed malignant pleural effusion - s/p pigtail chest tube placement yesterday by Dr. Vernard Gambles.  Continue care per Pulmonary.   Record output.   IR will continue to follow.  Electronically Signed: Murrell Redden, PA-C 10/27/2021, 1:17 PM    I spent a total of 15 Minutes at the the patient's bedside AND on the patient's hospital floor or unit, greater than 50% of which was counseling/coordinating care for f/u chest tube.

## 2021-10-28 ENCOUNTER — Encounter (HOSPITAL_COMMUNITY): Payer: Self-pay | Admitting: Family Medicine

## 2021-10-28 ENCOUNTER — Inpatient Hospital Stay (HOSPITAL_COMMUNITY): Payer: PPO | Admitting: Anesthesiology

## 2021-10-28 ENCOUNTER — Encounter (HOSPITAL_COMMUNITY)
Admission: EM | Disposition: A | Discharge status: Home Health | Payer: Self-pay | Source: Home / Self Care | Attending: Internal Medicine

## 2021-10-28 DIAGNOSIS — I10 Essential (primary) hypertension: Secondary | ICD-10-CM | POA: Diagnosis not present

## 2021-10-28 DIAGNOSIS — R911 Solitary pulmonary nodule: Secondary | ICD-10-CM

## 2021-10-28 DIAGNOSIS — Z515 Encounter for palliative care: Secondary | ICD-10-CM | POA: Diagnosis not present

## 2021-10-28 DIAGNOSIS — R918 Other nonspecific abnormal finding of lung field: Secondary | ICD-10-CM | POA: Diagnosis not present

## 2021-10-28 DIAGNOSIS — C679 Malignant neoplasm of bladder, unspecified: Secondary | ICD-10-CM | POA: Diagnosis not present

## 2021-10-28 DIAGNOSIS — J90 Pleural effusion, not elsewhere classified: Secondary | ICD-10-CM | POA: Diagnosis not present

## 2021-10-28 DIAGNOSIS — J449 Chronic obstructive pulmonary disease, unspecified: Secondary | ICD-10-CM

## 2021-10-28 DIAGNOSIS — R590 Localized enlarged lymph nodes: Secondary | ICD-10-CM | POA: Diagnosis not present

## 2021-10-28 DIAGNOSIS — R0600 Dyspnea, unspecified: Secondary | ICD-10-CM | POA: Diagnosis not present

## 2021-10-28 DIAGNOSIS — R0603 Acute respiratory distress: Secondary | ICD-10-CM | POA: Diagnosis not present

## 2021-10-28 HISTORY — PX: BRONCHIAL NEEDLE ASPIRATION BIOPSY: SHX5106

## 2021-10-28 HISTORY — PX: VIDEO BRONCHOSCOPY WITH ENDOBRONCHIAL ULTRASOUND: SHX6177

## 2021-10-28 LAB — CBC
HCT: 34.3 % — ABNORMAL LOW (ref 36.0–46.0)
Hemoglobin: 11.9 g/dL — ABNORMAL LOW (ref 12.0–15.0)
MCH: 31.6 pg (ref 26.0–34.0)
MCHC: 34.7 g/dL (ref 30.0–36.0)
MCV: 91.2 fL (ref 80.0–100.0)
Platelets: 324 K/uL (ref 150–400)
RBC: 3.76 MIL/uL — ABNORMAL LOW (ref 3.87–5.11)
RDW: 13.4 % (ref 11.5–15.5)
WBC: 14.5 K/uL — ABNORMAL HIGH (ref 4.0–10.5)
nRBC: 0 % (ref 0.0–0.2)

## 2021-10-28 LAB — BASIC METABOLIC PANEL WITH GFR
Anion gap: 11 (ref 5–15)
BUN: 16 mg/dL (ref 8–23)
CO2: 27 mmol/L (ref 22–32)
Calcium: 8.6 mg/dL — ABNORMAL LOW (ref 8.9–10.3)
Chloride: 93 mmol/L — ABNORMAL LOW (ref 98–111)
Creatinine, Ser: 0.57 mg/dL (ref 0.44–1.00)
GFR, Estimated: >60 mL/min
Glucose, Bld: 124 mg/dL — ABNORMAL HIGH (ref 70–99)
Potassium: 4.4 mmol/L (ref 3.5–5.1)
Sodium: 131 mmol/L — ABNORMAL LOW (ref 135–145)

## 2021-10-28 SURGERY — BRONCHOSCOPY, WITH EBUS
Anesthesia: General | Laterality: Bilateral

## 2021-10-28 MED ORDER — CHLORHEXIDINE GLUCONATE 0.12 % MT SOLN
15.0000 mL | Freq: Once | OROMUCOSAL | Status: AC
Start: 2021-10-28 — End: 2021-10-28

## 2021-10-28 MED ORDER — ONDANSETRON HCL 4 MG/2ML IJ SOLN
INTRAMUSCULAR | Status: DC | PRN
  Administered 2021-10-28: 4 mg via INTRAVENOUS

## 2021-10-28 MED ORDER — ROCURONIUM BROMIDE 10 MG/ML (PF) SYRINGE
PREFILLED_SYRINGE | INTRAVENOUS | Status: DC | PRN
  Administered 2021-10-28: 40 mg via INTRAVENOUS

## 2021-10-28 MED ORDER — ADULT MULTIVITAMIN W/MINERALS CH
1.0000 | ORAL_TABLET | Freq: Every day | ORAL | Status: DC
Start: 2021-10-28 — End: 2021-10-30
  Administered 2021-10-28 – 2021-10-30 (×3): 1 via ORAL
  Filled 2021-10-28 (×3): qty 1

## 2021-10-28 MED ORDER — SUGAMMADEX SODIUM 200 MG/2ML IV SOLN
INTRAVENOUS | Status: DC | PRN
  Administered 2021-10-28: 200 mg via INTRAVENOUS

## 2021-10-28 MED ORDER — PHENYLEPHRINE HCL-NACL 20-0.9 MG/250ML-% IV SOLN
INTRAVENOUS | Status: DC | PRN
  Administered 2021-10-28: 20 ug/min via INTRAVENOUS

## 2021-10-28 MED ORDER — DEXAMETHASONE SODIUM PHOSPHATE 10 MG/ML IJ SOLN
INTRAMUSCULAR | Status: DC | PRN
  Administered 2021-10-28: 5 mg via INTRAVENOUS

## 2021-10-28 MED ORDER — LIDOCAINE 2% (20 MG/ML) 5 ML SYRINGE
INTRAMUSCULAR | Status: DC | PRN
  Administered 2021-10-28: 60 mg via INTRAVENOUS

## 2021-10-28 MED ORDER — LACTATED RINGERS IV SOLN: INTRAVENOUS | Status: DC

## 2021-10-28 MED ORDER — PROPOFOL 10 MG/ML IV BOLUS
INTRAVENOUS | Status: DC | PRN
  Administered 2021-10-28: 20 mg via INTRAVENOUS
  Administered 2021-10-28: 80 mg via INTRAVENOUS
  Administered 2021-10-28: 30 mg via INTRAVENOUS

## 2021-10-28 MED ORDER — CHLORHEXIDINE GLUCONATE 0.12 % MT SOLN
OROMUCOSAL | Status: AC
  Administered 2021-10-28: 15 mL via OROMUCOSAL
  Filled 2021-10-28: qty 15

## 2021-10-28 MED ORDER — ENSURE ENLIVE PO LIQD
237.0000 mL | Freq: Three times a day (TID) | ORAL | Status: DC
  Administered 2021-10-28 – 2021-10-30 (×4): 237 mL via ORAL

## 2021-10-28 MED ORDER — FENTANYL CITRATE (PF) 250 MCG/5ML IJ SOLN
INTRAMUSCULAR | Status: DC | PRN
  Administered 2021-10-28 (×2): 50 ug via INTRAVENOUS

## 2021-10-28 MED ORDER — FENTANYL CITRATE (PF) 100 MCG/2ML IJ SOLN
INTRAMUSCULAR | Status: AC
  Filled 2021-10-28: qty 2

## 2021-10-28 SURGICAL SUPPLY — 30 items
BRUSH CYTOL CELLEBRITY 1.5X140 (MISCELLANEOUS) IMPLANT
CANISTER SUCT 3000ML PPV (MISCELLANEOUS) ×4 IMPLANT
CONT SPEC 4OZ CLIKSEAL STRL BL (MISCELLANEOUS) ×4 IMPLANT
COVER BACK TABLE 60X90IN (DRAPES) ×4 IMPLANT
COVER DOME SNAP 22 D (MISCELLANEOUS) ×4 IMPLANT
FORCEPS BIOP RJ4 1.8 (CUTTING FORCEPS) IMPLANT
GAUZE SPONGE 4X4 12PLY STRL (GAUZE/BANDAGES/DRESSINGS) ×4 IMPLANT
GLOVE BIO SURGEON STRL SZ7.5 (GLOVE) ×4 IMPLANT
GOWN STRL REUS W/ TWL LRG LVL3 (GOWN DISPOSABLE) ×3 IMPLANT
GOWN STRL REUS W/TWL LRG LVL3 (GOWN DISPOSABLE) ×4
KIT CLEAN ENDO COMPLIANCE (KITS) ×8 IMPLANT
KIT TURNOVER KIT B (KITS) ×4 IMPLANT
MARKER SKIN DUAL TIP RULER LAB (MISCELLANEOUS) ×4 IMPLANT
NDL EBUS SONO TIP PENTAX (NEEDLE) ×2 IMPLANT
NEEDLE EBUS SONO TIP PENTAX (NEEDLE) ×4 IMPLANT
NS IRRIG 1000ML POUR BTL (IV SOLUTION) ×4 IMPLANT
OIL SILICONE PENTAX (PARTS (SERVICE/REPAIRS)) ×4 IMPLANT
PAD ARMBOARD 7.5X6 YLW CONV (MISCELLANEOUS) ×8 IMPLANT
SOL ANTI FOG 6CC (MISCELLANEOUS) ×3 IMPLANT
SOLUTION ANTI FOG 6CC (MISCELLANEOUS) ×1
SYR 20CC LL (SYRINGE) ×8 IMPLANT
SYR 20ML ECCENTRIC (SYRINGE) ×8 IMPLANT
SYR 50ML SLIP (SYRINGE) IMPLANT
SYR 5ML LUER SLIP (SYRINGE) ×4 IMPLANT
TOWEL OR 17X24 6PK STRL BLUE (TOWEL DISPOSABLE) ×4 IMPLANT
TRAP SPECIMEN MUCOUS 40CC (MISCELLANEOUS) IMPLANT
TUBE CONNECTING 20X1/4 (TUBING) ×8 IMPLANT
UNDERPAD 30X30 (UNDERPADS AND DIAPERS) ×4 IMPLANT
VALVE DISPOSABLE (MISCELLANEOUS) ×4 IMPLANT
WATER STERILE IRR 1000ML POUR (IV SOLUTION) ×4 IMPLANT

## 2021-10-28 NOTE — Anesthesia Preprocedure Evaluation (Addendum)
Anesthesia Evaluation  Patient identified by MRN, date of birth, ID band Patient awake    Reviewed: Allergy & Precautions, NPO status , Patient's Chart, lab work & pertinent test results  History of Anesthesia Complications (+) PONV and history of anesthetic complications  Airway Mallampati: II  TM Distance: >3 FB Neck ROM: Full    Dental  (+) Missing,    Pulmonary COPD,  COPD inhaler, former smoker,    Pulmonary exam normal        Cardiovascular hypertension, Pt. on medications Normal cardiovascular exam     Neuro/Psych negative neurological ROS  negative psych ROS   GI/Hepatic Neg liver ROS, GERD  Medicated and Controlled,  Endo/Other  negative endocrine ROS  Renal/GU negative Renal ROS  negative genitourinary   Musculoskeletal  (+) Arthritis ,   Abdominal   Peds  Hematology  (+) Blood dyscrasia (Hgb 11.9), anemia ,   Anesthesia Other Findings Day of surgery medications reviewed with patient.  Reproductive/Obstetrics negative OB ROS                            Anesthesia Physical Anesthesia Plan  ASA: 3  Anesthesia Plan: General   Post-op Pain Management: Minimal or no pain anticipated   Induction: Intravenous  PONV Risk Score and Plan: 4 or greater and Treatment may vary due to age or medical condition, Ondansetron, Dexamethasone and Midazolam  Airway Management Planned: Oral ETT  Additional Equipment: None  Intra-op Plan:   Post-operative Plan: Extubation in OR  Informed Consent: I have reviewed the patients History and Physical, chart, labs and discussed the procedure including the risks, benefits and alternatives for the proposed anesthesia with the patient or authorized representative who has indicated his/her understanding and acceptance.   Patient has DNR.  Discussed DNR with patient and Suspend DNR.   Dental advisory given  Plan Discussed with:  CRNA  Anesthesia Plan Comments:        Anesthesia Quick Evaluation

## 2021-10-28 NOTE — Anesthesia Procedure Notes (Signed)
Procedure Name: Intubation Date/Time: 10/28/2021 1:20 PM  Performed by: Thelma Comp, CRNAPre-anesthesia Checklist: Patient identified, Emergency Drugs available, Suction available and Patient being monitored Patient Re-evaluated:Patient Re-evaluated prior to induction Oxygen Delivery Method: Circle System Utilized Preoxygenation: Pre-oxygenation with 100% oxygen Induction Type: IV induction Ventilation: Mask ventilation without difficulty Laryngoscope Size: Glidescope and 3 Grade View: Grade I Tube type: Oral Tube size: 8.5 mm Number of attempts: 1 Airway Equipment and Method: Stylet Placement Confirmation: ETT inserted through vocal cords under direct vision, positive ETCO2 and breath sounds checked- equal and bilateral Secured at: 21 cm Tube secured with: Tape Dental Injury: Teeth and Oropharynx as per pre-operative assessment  Comments: DL x 1, unsuccessful placement, glide with successful placement

## 2021-10-28 NOTE — Anesthesia Postprocedure Evaluation (Signed)
Anesthesia Post Note  Patient: Rhonda Cook  Procedure(s) Performed: VIDEO BRONCHOSCOPY WITH ENDOBRONCHIAL ULTRASOUND (Bilateral) BRONCHIAL NEEDLE ASPIRATION BIOPSIES     Patient location during evaluation: PACU Anesthesia Type: General Level of consciousness: awake and patient cooperative Pain management: pain level controlled Vital Signs Assessment: post-procedure vital signs reviewed and stable Respiratory status: spontaneous breathing, nonlabored ventilation, respiratory function stable and patient connected to nasal cannula oxygen Cardiovascular status: blood pressure returned to baseline and stable Postop Assessment: no apparent nausea or vomiting Anesthetic complications: no   No notable events documented.  Last Vitals:  Vitals:   10/28/21 1432 10/28/21 1439  BP: (!) 137/46 (!) 127/54  Pulse: 100 100  Resp: 20 (!) 24  Temp:    SpO2: 96% 95%    Last Pain:  Vitals:   10/28/21 1521  TempSrc:   PainSc: 10-Worst pain ever                 Ubaldo Daywalt

## 2021-10-28 NOTE — Op Note (Signed)
Video Bronchoscopy with Endobronchial Ultrasound Procedure Note  Date of Operation: 10/28/2021  Pre-op Diagnosis: lung nodules, adenopathy  Post-op Diagnosis: lung nodules, adenopathy   Surgeon: Garner Nash, DO   Assistants: None   Anesthesia: General endotracheal anesthesia  Operation: Flexible video fiberoptic bronchoscopy with endobronchial ultrasound and biopsies.  Estimated Blood Loss: Minimal  Complications: None   Indications and History: Rhonda Cook is a 76 y.o. female with lung nodules, adenopathy, malignant pleural effusions.  The risks, benefits, complications, treatment options and expected outcomes were discussed with the patient.  The possibilities of pneumothorax, pneumonia, reaction to medication, pulmonary aspiration, perforation of a viscus, bleeding, failure to diagnose a condition and creating a complication requiring transfusion or operation were discussed with the patient who freely signed the consent.    Description of Procedure: The patient was examined in the preoperative area and history and data from the preprocedure consultation were reviewed. It was deemed appropriate to proceed.  The patient was taken to [facility] endo room 3, identified as Rhonda Cook and the procedure verified as Flexible Video Fiberoptic Bronchoscopy.  A Time Out was held and the above information confirmed. After being taken to the operating room general anesthesia was initiated and the patient  was orally intubated. The video fiberoptic bronchoscope was introduced via the endotracheal tube and a general inspection was performed which showed thick mucus plugging in the RML and RLL. This was aspirated free and cleared using the therapeutic scope and saline for irrigation and aspiration. The standard scope was then withdrawn and the endobronchial ultrasound was used to identify and characterize the peritracheal, hilar and bronchial lymph nodes. Inspection showed enlarged station 4R lymphnode.  Using real-time ultrasound guidance Wang needle biopsies were take from Station 4R nodes and were sent for cytology. The patient tolerated the procedure well without apparent complications. There was no significant blood loss. The bronchoscope was withdrawn. Anesthesia was reversed and the patient was taken to the PACU for recovery.   Samples: 1. Wang needle biopsies from 4R node 2. She has a chestube in place so we pulled additional fluid to be sent for cytology from the left chest drain   Plans:  The patient will be discharged from the PACU to home when recovered from anesthesia. We will review the cytology, pathology results with the patient when they become available. Outpatient followup will be with Garner Nash, DO.   Garner Nash, DO [facility] 10/28/2021 1:59 PM

## 2021-10-28 NOTE — Progress Notes (Signed)
PROGRESS NOTE    Rhonda Cook  NWG:[id number] DOB: 05-30-1945 DOA: 10/19/2021 PCP: [facility]    Brief Narrative:  76 year old female with past medical history of bladder cancer undergoing chemotherapy presented to the hospital with 1 week history of increasing shortness of breath, some cough but no fever or chills.  In the ED, patient was noted to have bilateral pleural effusion and very large left pleural effusion with  mediastinal lymphadenopathy.  There was some concern for paraspinal metastatic disease.  Patient was then considered for admission to the hospital for further evaluation and treatment.    During hospitalization, patient underwent thoracocentesis followed by chest tube placement.  PCCM following at this time.  PCCM planning for bronchoscopy 10/28/2021  Assessment and Plan: Principal Problem:   Acute respiratory distress Active Problems:   Large loculated presumed malignant pleural effusion on left   Pleural effusion, right   Acute dyspnea   Bladder cancer on chemotherapy   Paraspinal soft tissue mass   Lung nodules   Painful bladder spasm   Acute respiratory distress/dyspnea Likely secondary to COPD exacerbation, bilateral pleural effusion, large left pleural effusion.  IR was consulted and patient underwent ultrasound-guided thoracocentesis with removal of 240 mL of bloody fluid on 10/20/2021.  Pleural fluid culture negative from previous thoracocentesis.  On 10/20/2021 which showed atypical cells.    Blood cultures negative.  IR was again consulted and patient underwent IR guided pleural drain placement on 10/25/2021.    On IV steroids and bronchodilators.  Continue chest tube management as per pulmonary.  Repeat  pleural fluid cytology with atypical cells.  I communicated with Dr. Gilford Rile urology yesterday who believes that her bladder cancer is very early stage and controlled and does not believe that this is likely to be metastatic from bladder  cancer.  Pulmonary is aware of this and plan for bronchoscopy at this time.  Large loculated likely malignant pleural effusion on left Status post thoracocentesis with bloody fluid.  CT scan showed some pleural thickening and loculation.  Pleural fluid cytology showing atypical cells. Status post IR guided chest tube placement.  Plan for bronchoscopy today.  Paraspinal soft tissue mass Concerning for metastatic disease.  CT scan of the abdomen pelvis 10/19/2021 without any evidence of metastasis.  CT chest with paraspinal soft tissue.  History of bladder cancer and chemotherapy with Dr. Gilford Rile as outpatient.  Bladder cancer on chemotherapy Patient was seen by Dr. Gilford Rile as outpatient.  Dr. Lovena Neighbours believes that the patient has a localized bladder and unlikely to explain metastatic disease in the lungs.   Pleural effusion, right Small for thoracocentesis.    DVT prophylaxis: enoxaparin (LOVENOX) injection 40 mg Start: 10/20/21 1300 SCDs Start: 10/19/21 1332   Code Status:     Code Status: DNR  Disposition:  Home in 2 to 3 days  Status is: Inpatient  Remains inpatient appropriate because: Status post chest tube placement   Family Communication:  Communicated with the patient's husband at bedside on 10/28/2021  Consultants:  Interventional radiology Pulmonary  Procedures:  Ultrasound-guided thoracocentesis on 10/20/2021 IR guided pleural tube placement on 10/25/2021  Antimicrobials:  None currently  Anti-infectives (From admission, onward)    Start     Dose/Rate Route Frequency Ordered Stop   10/20/21 1000  doxycycline (VIBRA-TABS) tablet 100 mg        100 mg Oral Every 12 hours 10/19/21 1335 10/23/21 2106   10/19/21 1300  levofloxacin (LEVAQUIN) IVPB 750 mg  750 mg 100 mL/hr over 90 Minutes Intravenous  Once 10/19/21 1250 10/19/21 1634      Subjective: Today, patient was seen and examined at bedside.  Patient complains of mild chest discomfort while taking a  deep breath, denies any  nausea, vomiting, fever or chills.  Breathing overall has improved.  Objective: Vitals:   10/27/21 1848 10/27/21 2015 10/28/21 0544 10/28/21 0930  BP: (!) 111/51 (!) 103/53 130/65 (!) 115/59  Pulse: 84 71 84 85  Resp:  18 (!) 22 20  Temp:  97.6 F (36.4 C) 97.8 F (36.6 C) 97.8 F (36.6 C)  TempSrc:  Oral Oral Oral  SpO2:  98% 96% 96%  Weight:      Height:        Intake/Output Summary (Last 24 hours) at 10/28/2021 1048 Last data filed at 10/28/2021 0900 Gross per 24 hour  Intake 680 ml  Output 290 ml  Net 390 ml   Filed Weights   10/19/21 0817  Weight: 59 kg    Physical Examination:  Body mass index is 23.04 kg/m.   General:  Average built, not in obvious distress, Communicative, HENT:   No scleral pallor or icterus noted. Oral mucosa is moist.  Chest:   Diminished breath sound over the base on the left side.  Chest tube in the left chest wall. CVS: S1 &S2 heard. No murmur.  Regular rate and rhythm. Abdomen: Soft, nontender, nondistended.  Bowel sounds are heard.   Extremities: No cyanosis, clubbing or edema.  Peripheral pulses are palpable. Psych: Alert, awake and oriented, Communicative. CNS:  No cranial nerve deficits.  Power equal in all extremities.   Skin: Warm and dry.  No rashes noted.  Data Reviewed:   CBC: Recent Labs  Lab 10/25/21 0125 10/26/21 0623 10/27/21 0145 10/28/21 0103  WBC 12.2* 13.4* 12.6* 14.5*  HGB 12.0 12.2 11.7* 11.9*  HCT 35.4* 35.9* 34.9* 34.3*  MCV 91.9 91.3 91.6 91.2  PLT 339 315 308 211    Basic Metabolic Panel: Recent Labs  Lab 10/26/21 0623 10/27/21 0145 10/28/21 0103  NA 135 133* 131*  K 4.1 4.5 4.4  CL 95* 97* 93*  CO2 27 25 27   GLUCOSE 102* 110* 124*  BUN 21 17 16   CREATININE 0.68 0.52 0.57  CALCIUM 8.8* 8.7* 8.6*  MG 1.9 1.9  --     Liver Function Tests: No results for input(s): "AST", "ALT", "ALKPHOS", "BILITOT", "PROT", "ALBUMIN" in the last 168 hours.   Radiology  Studies: No results found.    LOS: 9 days    Flora Lipps, MD [facility] Available via Epic secure chat 7am-7pm After these hours, please refer to coverage provider listed on [email or web address] 10/28/2021, 10:48 AM

## 2021-10-28 NOTE — Progress Notes (Signed)
Palliative Medicine Inpatient Follow Up Note   HPI: 76 y.o. female  with past medical history of longtime smoker with COPD, currently being treated for bladder cancer with trusted urologist, Dr. Lovena Neighbours, autoimmune thyroiditis, arthritis, HTN/HLD, osteoporosis, admitted on 10/19/2021 with acute respiratory distress COPD exacerbation with ongoing tobacco use and large left and right pleural effusions.  Thoracentesis left lung 6/22 240cc bloody fluid sent for analysis.   Today's Discussion 10/28/2021  *Please note that this is a verbal dictation therefore any spelling or grammatical errors are due to the "[place] [employer]" system interpretation.  Chart reviewed inclusive of vital signs, progress notes, laboratory results, and diagnostic images.   I met with Rhonda Cook and her husband, Rhonda Cook in regards to her present admission. We reviewed her reasoning for coming into the hospital inclusive of shortness of breath. Discussed her need for a chest tube in the setting of her large left pleaural effusion. She shares that this morning she feels her shortness of breath has improved quite a bit. We reviewed that she is going to have a lung biopsy today to provide a better idea of whether her bladder cancer has metastasized. She shares the goal to continue chemotherapy but understands he disease id unlikely to be cured at this point. We reviewed her desire to speak with her urologist Dr. Gilford Rile for better clarification on what can and cannot be done once her biopsy results come back.   We reviewed the idea of palliative care versus hospice care. I shared based upon what is determined, she would either continue with her regiment, have it altered further, or decide on hospice care. [name] is familar with hospice and comfortable with this plan presently.   Created space and opportunity for patient to explore thoughts feelings and fears regarding current medical situation.  Questions and concerns addressed    Palliative Support Provided  Objective Assessment: Vital Signs Vitals:   10/28/21 0544 10/28/21 0930  BP: 130/65 (!) 115/59  Pulse: 84 85  Resp: (!) 22 20  Temp: 97.8 F (36.6 C) 97.8 F (36.6 C)  SpO2: 96% 96%    Intake/Output Summary (Last 24 hours) at 10/28/2021 1129 Last data filed at 10/28/2021 0900 Gross per 24 hour  Intake 680 ml  Output 290 ml  Net 390 ml   Last Weight  Most recent update: 10/19/2021  8:18 AM    Weight  59 kg (130 lb 1.1 oz)            Gen:  Elderly Caucasian F  HEENT: moist mucous membranes CV: Regular rate and rhythm PULM: On RA, breathing is even and nonlabored, (+) L chest tube ABD: soft/nontender EXT: No edema Neuro: Alert and oriented x3  SUMMARY OF RECOMMENDATIONS   DNAR/DNI  Continue to treat the treatable   Biopsy by Pulmonology today  Follow-up with trusted oncologist, Dr. Gilford Rile  Ongoing conversation upon discharge regarding next steps  Billing based on MDM: High  Problems Addressed: One acute or chronic illness or injury that poses a threat to life or bodily function  Amount and/or Complexity of Data: Category 3:Discussion of management or test interpretation with external physician/other qualified health care professional/appropriate source (not separately reported)  Risks: Decision regarding hospitalization or escalation of hospital care ______________________________________________________________________________________ [facility] Team Team Cell Phone: [phone] Please utilize secure chat with additional questions, if there is no response within 30 minutes please call the above phone number  Palliative Medicine Team providers are available by phone from  7am to 7pm daily and can be reached through the team cell phone.  Should this patient require assistance outside of these hours, please call the patient's attending physician.

## 2021-10-28 NOTE — Progress Notes (Signed)
Held AM meds d/t procedure this morning

## 2021-10-28 NOTE — Plan of Care (Signed)
  Problem: Elimination: Goal: Will not experience complications related to bowel motility Outcome: Not Progressing   Problem: Pain Managment: Goal: General experience of comfort will improve Outcome: Not Progressing   Problem: Respiratory: Goal: Ability to maintain a clear airway will improve Outcome: Not Progressing   Problem: Respiratory: Goal: Levels of oxygenation will improve Outcome: Not Progressing

## 2021-10-28 NOTE — Progress Notes (Signed)
Initial Nutrition Assessment  DOCUMENTATION CODES:  Severe malnutrition in context of chronic illness  INTERVENTION:  Liberalize diet to regular Ensure Enlive po TID, each supplement provides 350 kcal and 20 grams of protein. Magic cup TID with meals, each supplement provides 290 kcal and 9 grams of protein MVI with minerals daily  NUTRITION DIAGNOSIS:  Severe Malnutrition related to cancer and cancer related treatments as evidenced by severe muscle depletion, severe fat depletion.  GOAL:  Patient will meet greater than or equal to 90% of their needs  MONITOR:  PO intake, Labs, I & O's, Diet advancement, Supplement acceptance  REASON FOR ASSESSMENT:  Malnutrition Screening Tool    ASSESSMENT:  Pt with hx of tobacco abuse, bladder cancer (active chemo) and COPD, HTN, HLD, and osteoporosis presented to ED with SOB. Found to have bilateral pleural effusions.  Pt resting in bed at the time of visit. Currently NPO waiting to have bronchoscopy. Pt reports poor PO x 2 weeks. States that all she has been eating is saltine crackers and water during this time. Usually weighs 130lb, currently 125 lb. Underwent last chemo treatment on the 14th.   Pt reports drinking ensure at home, agreeable to receiving once diet is advanced. Will also liberalize diet and add magic cup as pt like orange sherbet.   Average Meal Intake: 6/24-6/30: 74% intake x 8 recorded meals  Nutritionally Relevant Medications: Scheduled Meds:  bisacodyl  10 mg Rectal Daily   docusate sodium  100 mg Oral QHS   famotidine  40 mg Oral Daily   hydrochlorothiazide  12.5 mg Oral Daily   methylPREDNISolone injection  40 mg Intravenous Q24H   pantoprazole  40 mg Oral QAC breakfast   polyethylene glycol  17 g Oral Daily   simvastatin  20 mg Oral QHS   PRN Meds: ondansetron  Labs Reviewed: Na 131, Chloride 93  NUTRITION - FOCUSED PHYSICAL EXAM: Flowsheet Row Most Recent Value  Orbital Region Severe depletion  Upper  Arm Region Mild depletion  Thoracic and Lumbar Region Moderate depletion  Buccal Region Severe depletion  Temple Region Mild depletion  Clavicle Bone Region Severe depletion  Clavicle and Acromion Bone Region Severe depletion  Scapular Bone Region Moderate depletion  Dorsal Hand Severe depletion  Patellar Region Severe depletion  Anterior Thigh Region Severe depletion  Posterior Calf Region Severe depletion  Edema (RD Assessment) None  Hair Reviewed  Eyes Reviewed  Mouth Reviewed  Skin Reviewed  Nails Reviewed    Diet Order:   Diet Order             Diet regular Room service appropriate? Yes; Fluid consistency: Thin  Diet effective now                   EDUCATION NEEDS:  Education needs have been addressed  Skin:  Skin Assessment: Reviewed RN Assessment  Last BM:  6/29 - type 6  Height:  Ht Readings from Last 1 Encounters:  10/28/21 5\' 3"  (1.6 m)    Weight:  Wt Readings from Last 1 Encounters:  10/28/21 56.7 kg    Ideal Body Weight:  52.3 kg  BMI:  Body mass index is 22.14 kg/m.  Estimated Nutritional Needs:  Kcal:  1800-2000 kcal/d Protein:  90-110 g/d Fluid:  >/=1.8L/d   Ranell Patrick, RD, LDN Clinical Dietitian RD pager # available in [facility]  After hours/weekend pager # available in [facility]

## 2021-10-28 NOTE — Transfer of Care (Signed)
Immediate Anesthesia Transfer of Care Note  Patient: Rhonda Cook  Procedure(s) Performed: VIDEO BRONCHOSCOPY WITH ENDOBRONCHIAL ULTRASOUND (Bilateral) BRONCHIAL NEEDLE ASPIRATION BIOPSIES  Patient Location: PACU and Endoscopy Unit  Anesthesia Type:General  Level of Consciousness: drowsy and patient cooperative  Airway & Oxygen Therapy: Patient Spontanous Breathing and Patient connected to nasal cannula oxygen  Post-op Assessment: Report given to RN and Post -op Vital signs reviewed and stable  Post vital signs: Reviewed and stable  Last Vitals:  Vitals Value Taken Time  BP 123/54 10/28/21 1410  Temp 36.8 C 10/28/21 1409  Pulse 106 10/28/21 1411  Resp 19 10/28/21 1411  SpO2 95 % 10/28/21 1411  Vitals shown include unvalidated device data.  Last Pain:  Vitals:   10/28/21 1409  TempSrc: Oral  PainSc: 0-No pain      Patients Stated Pain Goal: 1 (71/06/26 9485)  Complications: No notable events documented.

## 2021-10-28 NOTE — Interval H&P Note (Signed)
History and Physical Interval Note:  10/28/2021 1:02 PM  Rhonda Cook  has presented today for surgery, with the diagnosis of lung nodules, adenopathy.  The various methods of treatment have been discussed with the patient and family. After consideration of risks, benefits and other options for treatment, the patient has consented to  Procedure(s) with comments: ROBOTIC ASSISTED NAVIGATIONAL BRONCHOSCOPY (Right) - ION w/ CIOS VIDEO BRONCHOSCOPY WITH ENDOBRONCHIAL ULTRASOUND (Bilateral) as a surgical intervention.  The patient's history has been reviewed, patient examined, no change in status, stable for surgery.  I have reviewed the patient's chart and labs.  Questions were answered to the patient's satisfaction.     [place]

## 2021-10-28 NOTE — Plan of Care (Signed)
  Problem: Education: Goal: Knowledge of General Education information will improve Description Including pain rating scale, medication(s)/side effects and non-pharmacologic comfort measures Outcome: Progressing   Problem: Health Behavior/Discharge Planning: Goal: Ability to manage health-related needs will improve Outcome: Progressing   

## 2021-10-28 NOTE — Discharge Instructions (Signed)
Flexible Bronchoscopy, Care After This sheet gives you information about how to care for yourself after your test. Your doctor may also give you more specific instructions. If you have problems or questions, contact your doctor. Follow these instructions at home: Eating and drinking Do not eat or drink anything (not even water) for 2 hours after your test, or until your numbing medicine (local anesthetic) wears off. When your numbness is gone and your cough and gag reflexes have come back, you may: Eat only soft foods. Slowly drink liquids. The day after the test, go back to your normal diet. Driving Do not drive for 24 hours if you were given a medicine to help you relax (sedative). Do not drive or use heavy machinery while taking prescription pain medicine. General instructions  Take over-the-counter and prescription medicines only as told by your doctor. Return to your normal activities as told. Ask what activities are safe for you. Do not use any products that have nicotine or tobacco in them. This includes cigarettes and e-cigarettes. If you need help quitting, ask your doctor. Keep all follow-up visits as told by your doctor. This is important. It is very important if you had a tissue sample (biopsy) taken. Get help right away if: You have shortness of breath that gets worse. You get light-headed. You feel like you are going to pass out (faint). You have chest pain. You cough up: More than a little blood. More blood than before. Summary Do not eat or drink anything (not even water) for 2 hours after your test, or until your numbing medicine wears off. Do not use cigarettes. Do not use e-cigarettes. Get help right away if you have chest pain.  This information is not intended to replace advice given to you by your health care provider. Make sure you discuss any questions you have with your health care provider. Document Released: 02/12/2009 Document Revised: 03/30/2017 Document  Reviewed: 05/05/2016 Elsevier Patient Education  2020 [employer].

## 2021-10-28 NOTE — Progress Notes (Signed)
Physical Therapy Treatment Patient Details Name: Rhonda Cook MRN: [id number] DOB: 01/01/1946 Today's Date: 10/28/2021   History of Present Illness Pt is a 76 y.o. F who presents 10/19/2021 with shortness of breath. Patient underwent CXR and CT CAP which showed small right pleural effusion, loculated moderate left pleural effusion, as well as imaging finding concerning for metastatic disease in the chest such as pulmonary nodules, enlarged mediastinal lymph nodes, and asymmetric left paraspinous soft tissue thickening. Patient had  no evidence of metastatic disease in the abdomen or pelvis. S/p thoracentesis 6/22. Pending VATS.  Significant PMH: HTN, COPD, bladder CA. Pt post flexible video fiberoptic bronchoscopy with endobronchial ultrasound and biopsies on 06/30.    PT Comments    Pt reports feeling loopy after her procedure and receiving pain medicine. Pt requesting to stay in bed but agreeable to bed exercises. Pt performed therapeutic exercises as instructed and had more difficulty on L LE 2/2 weakness. Progress as tolerated.   Recommendations for follow up therapy are one component of a multi-disciplinary discharge planning process, led by the attending physician.  Recommendations may be updated based on patient status, additional functional criteria and insurance authorization.  Follow Up Recommendations  Home health PT     Assistance Recommended at Discharge PRN  Patient can return home with the following A little help with walking and/or transfers;A little help with bathing/dressing/bathroom;Help with stairs or ramp for entrance;Assistance with cooking/housework   Equipment Recommendations  None recommended by PT    Recommendations for Other Services       Precautions / Restrictions Precautions Precautions: Fall Precaution Comments: chest tube, watch O2 Restrictions Weight Bearing Restrictions: No     Mobility  Bed Mobility                    Transfers                         Ambulation/Gait                   Stairs             Wheelchair Mobility    Modified Rankin (Stroke Patients Only)       Balance Overall balance assessment: Needs assistance Sitting-balance support: Feet supported, No upper extremity supported Sitting balance-Leahy Scale: Good     Standing balance support: Bilateral upper extremity supported Standing balance-Leahy Scale: Fair Standing balance comment: moderate reliance on rollator                            Cognition Arousal/Alertness: Awake/alert Behavior During Therapy: WFL for tasks assessed/performed Overall Cognitive Status: Within Functional Limits for tasks assessed                                          Exercises General Exercises - Lower Extremity Hip ABduction/ADduction: Both, Supine, 10 reps (also performed hip adduction pillow squeezes x 12 bilaterally) Straight Leg Raises: Both, Supine (10 reps on R and 8 reps on L) Other Exercises Other Exercises: bridges x 5 (low volume and limited ROM 2/2 pain)    General Comments General comments (skin integrity, edema, etc.): 93% on 2L O2 and HR at 95      Pertinent Vitals/Pain Pain Assessment Pain Assessment: 0-10 Pain Score: 4  Pain Location: chest, stomach Pain  Descriptors / Indicators: Discomfort, Grimacing, Guarding Pain Intervention(s): Limited activity within patient's tolerance, Monitored during session, Premedicated before session    Home Living                          Prior Function            PT Goals (current goals can now be found in the care plan section) Acute Rehab PT Goals Patient Stated Goal: return home with family to assist PT Goal Formulation: With patient/family Time For Goal Achievement: 11/04/21 Potential to Achieve Goals: Good    Frequency    Min 3X/week      PT Plan Current plan remains appropriate    Co-evaluation               AM-PAC PT "6 Clicks" Mobility   Outcome Measure  Help needed turning from your back to your side while in a flat bed without using bedrails?: None Help needed moving from lying on your back to sitting on the side of a flat bed without using bedrails?: A Little Help needed moving to and from a bed to a chair (including a wheelchair)?: A Little Help needed standing up from a chair using your arms (e.g., wheelchair or bedside chair)?: A Little Help needed to walk in hospital room?: A Little Help needed climbing 3-5 steps with a railing? : A Little 6 Click Score: 19    End of Session Equipment Utilized During Treatment: Gait belt;Oxygen Activity Tolerance: Patient tolerated treatment well Patient left: in bed;with call bell/phone within reach;with family/visitor present Nurse Communication: Mobility status PT Visit Diagnosis: Unsteadiness on feet (R26.81);Other abnormalities of gait and mobility (R26.89);Muscle weakness (generalized) (M62.81)     Time: 1583-0940 PT Time Calculation (min) (ACUTE ONLY): 13 min  Charges:  $Therapeutic Exercise: 8-22 mins                     Donna Bernard, PT    [facility] 10/28/2021, 3:50 PM

## 2021-10-29 ENCOUNTER — Inpatient Hospital Stay (HOSPITAL_COMMUNITY): Payer: PPO

## 2021-10-29 DIAGNOSIS — R0600 Dyspnea, unspecified: Secondary | ICD-10-CM | POA: Diagnosis not present

## 2021-10-29 DIAGNOSIS — E440 Moderate protein-calorie malnutrition: Secondary | ICD-10-CM | POA: Diagnosis present

## 2021-10-29 DIAGNOSIS — J90 Pleural effusion, not elsewhere classified: Secondary | ICD-10-CM | POA: Diagnosis not present

## 2021-10-29 DIAGNOSIS — E43 Unspecified severe protein-calorie malnutrition: Secondary | ICD-10-CM | POA: Diagnosis present

## 2021-10-29 DIAGNOSIS — C679 Malignant neoplasm of bladder, unspecified: Secondary | ICD-10-CM | POA: Diagnosis not present

## 2021-10-29 DIAGNOSIS — R0603 Acute respiratory distress: Secondary | ICD-10-CM | POA: Diagnosis not present

## 2021-10-29 DIAGNOSIS — Z515 Encounter for palliative care: Secondary | ICD-10-CM | POA: Diagnosis not present

## 2021-10-29 LAB — CBC
HCT: 34.4 % — ABNORMAL LOW (ref 36.0–46.0)
Hemoglobin: 11.7 g/dL — ABNORMAL LOW (ref 12.0–15.0)
MCH: 31.3 pg (ref 26.0–34.0)
MCHC: 34 g/dL (ref 30.0–36.0)
MCV: 92 fL (ref 80.0–100.0)
Platelets: 327 10*3/uL (ref 150–400)
RBC: 3.74 MIL/uL — ABNORMAL LOW (ref 3.87–5.11)
RDW: 13.3 % (ref 11.5–15.5)
WBC: 13.6 10*3/uL — ABNORMAL HIGH (ref 4.0–10.5)
nRBC: 0 % (ref 0.0–0.2)

## 2021-10-29 LAB — BASIC METABOLIC PANEL
Anion gap: 10 (ref 5–15)
BUN: 23 mg/dL (ref 8–23)
CO2: 28 mmol/L (ref 22–32)
Calcium: 8.6 mg/dL — ABNORMAL LOW (ref 8.9–10.3)
Chloride: 96 mmol/L — ABNORMAL LOW (ref 98–111)
Creatinine, Ser: 0.57 mg/dL (ref 0.44–1.00)
GFR, Estimated: >60 mL/min (ref >60)
Glucose, Bld: 124 mg/dL — ABNORMAL HIGH (ref 70–99)
Potassium: 4.7 mmol/L (ref 3.5–5.1)
Sodium: 134 mmol/L — ABNORMAL LOW (ref 135–145)

## 2021-10-29 MED ORDER — BISACODYL 10 MG RE SUPP: 10.0000 mg | Freq: Every day | RECTAL | Status: DC | PRN

## 2021-10-29 NOTE — Progress Notes (Signed)
   Palliative Medicine Inpatient Follow Up Note   HPI: 76 y.o. female  with past medical history of longtime smoker with COPD, currently being treated for bladder cancer with trusted urologist, Dr. Lovena Neighbours, autoimmune thyroiditis, arthritis, HTN/HLD, osteoporosis, admitted on 10/19/2021 with acute respiratory distress COPD exacerbation with ongoing tobacco use and large left and right pleural effusions.  Thoracentesis left lung 6/22 240cc bloody fluid sent for analysis.   Today's Discussion 10/29/2021  *Please note that this is a verbal dictation therefore any spelling or grammatical errors are due to the "[employer]" system interpretation.  Chart reviewed inclusive of vital signs, progress notes, laboratory results, and diagnostic images.   I met with Kari and her husband, [name] at bedside this late morning.  Caili shares that the procedure went well yesterday and she actually did not anticipate to feel as little discomfort as she did.    This morning Thursa's biggest complaint is she is having a lot of discomfort at the chest tube site, she expresses that since being placed to suction she is really quite uncomfortable.  We discussed that I would alert the primary team to hopefully identify if additional imaging service should be needed and/or if pulmonology needs to intervene.  In the meanwhile pain is being controlled with norco, reviewed that patient can also receive dilaudid which may provide greater relief.    Annabel shares that she is hopeful the chest tube can be removed sooner than later so that she can transition home.   Questions and concerns addressed   Palliative Support Provided  Objective Assessment: Vital Signs Vitals:   10/29/21 0450 10/29/21 0846  BP: (!) 125/59 (!) 124/56  Pulse: 82 85  Resp: 18 (!) 22  Temp: 97.7 F (36.5 C) 97.7 F (36.5 C)  SpO2: 97% 98%    Intake/Output Summary (Last 24 hours) at 10/29/2021 1252 Last data filed at 10/29/2021 1000 Gross per  24 hour  Intake 1064.5 ml  Output 200 ml  Net 864.5 ml    Last Weight  Most recent update: 10/28/2021 12:05 PM    Weight  56.7 kg (125 lb)            Gen:  Elderly Caucasian F  HEENT: moist mucous membranes CV: Regular rate and rhythm PULM: On RA, breathing is even and nonlabored, (+) L chest tube ABD: soft/nontender EXT: No edema Neuro: Alert and oriented x3  SUMMARY OF RECOMMENDATIONS   DNAR/DNI  Continue to treat the treatable   Have alerted primary team of pain associated with chest tube site --> Receiving norco and dilaudid  Follow-up with trusted oncologist, Dr. Gilford Rile after discharge  Ongoing conversation upon discharge regarding next steps ______________________________________________________________________________________ [facility] Team Team Cell Phone: [phone] Please utilize secure chat with additional questions, if there is no response within 30 minutes please call the above phone number  Palliative Medicine Team providers are available by phone from 7am to 7pm daily and can be reached through the team cell phone.  Should this patient require assistance outside of these hours, please call the patient's attending physician.

## 2021-10-29 NOTE — Progress Notes (Signed)
Chest tube remove, pt tolerated fair, gave pain meds see MAR.

## 2021-10-29 NOTE — Progress Notes (Signed)
PT Cancellation Note  Patient Details Name: Rhonda Cook MRN: [id number] DOB: 03/18/1946   Cancelled Treatment:    Reason Eval/Treat Not Completed: Pain limiting ability to participate. Pt writhing in pain on arrival to room. She reports that since having the chest tub suction turned on she has been in a lot of pain. Pt reports pain 15/10. RN notified of pt request for additional pain meds. Will check back as time allows.   Benjiman Core, PTA Acute Rehab  Allena Katz 10/29/2021, 11:16 AM

## 2021-10-29 NOTE — Progress Notes (Signed)
Physical Therapy Treatment Patient Details Name: Rhonda Cook MRN: [id number] DOB: 09-Apr-1946 Today's Date: 10/29/2021   History of Present Illness Pt is a 76 y.o. F who presents 10/19/2021 with shortness of breath. Patient underwent CXR and CT CAP which showed small right pleural effusion, loculated moderate left pleural effusion, as well as imaging finding concerning for metastatic disease in the chest such as pulmonary nodules, enlarged mediastinal lymph nodes, and asymmetric left paraspinous soft tissue thickening. Patient had  no evidence of metastatic disease in the abdomen or pelvis. S/p thoracentesis 6/22. Pending VATS.  Significant PMH: HTN, COPD, bladder CA. Pt post flexible video fiberoptic bronchoscopy with endobronchial ultrasound and biopsies on 06/30.    PT Comments    Pt reports she is feeling much better this afternoon and agreeable to participate with therapy. Pt increased hallway ambulation distance this session. Continued cues needed for rollator safety and technique. Will continue to follow acutely for mobility progression.     Recommendations for follow up therapy are one component of a multi-disciplinary discharge planning process, led by the attending physician.  Recommendations may be updated based on patient status, additional functional criteria and insurance authorization.  Follow Up Recommendations  Home health PT     Assistance Recommended at Discharge PRN  Patient can return home with the following A little help with walking and/or transfers;A little help with bathing/dressing/bathroom;Help with stairs or ramp for entrance;Assistance with cooking/housework   Equipment Recommendations  None recommended by PT    Recommendations for Other Services       Precautions / Restrictions Precautions Precautions: Fall Precaution Comments: chest tube, watch O2 Restrictions Weight Bearing Restrictions: No     Mobility  Bed Mobility               General  bed mobility comments: up in chair on arrival    Transfers Overall transfer level: Needs assistance Equipment used: Rollator (4 wheels) Transfers: Sit to/from Stand Sit to Stand: Min guard           General transfer comment: VCs for hand placement, min guard for safety    Ambulation/Gait Ambulation/Gait assistance: Min guard Gait Distance (Feet): 300 Feet Assistive device: Rollator (4 wheels) Gait Pattern/deviations: Step-through pattern Gait velocity: decreased     General Gait Details: cues for rollator safety and break use. Pt reported dizziness that resolved with time. Speed decreased as pt fatigued.   Stairs             Wheelchair Mobility    Modified Rankin (Stroke Patients Only)       Balance Overall balance assessment: Needs assistance Sitting-balance support: Feet supported, No upper extremity supported Sitting balance-Leahy Scale: Good Sitting balance - Comments: fair/good seated at EOB   Standing balance support: Bilateral upper extremity supported Standing balance-Leahy Scale: Fair Standing balance comment: moderate reliance on rollator                            Cognition Arousal/Alertness: Awake/alert Behavior During Therapy: WFL for tasks assessed/performed Overall Cognitive Status: Within Functional Limits for tasks assessed                                          Exercises      General Comments General comments (skin integrity, edema, etc.): Pt husband and cousin preasent      Pertinent  Vitals/Pain Pain Assessment Pain Assessment: Faces Faces Pain Scale: Hurts a little bit Pain Location: chest, stomach Pain Descriptors / Indicators: Discomfort, Grimacing, Guarding Pain Intervention(s): Monitored during session, Limited activity within patient's tolerance    Home Living                          Prior Function            PT Goals (current goals can now be found in the care plan  section) Acute Rehab PT Goals Patient Stated Goal: return home with family to assist PT Goal Formulation: With patient/family Time For Goal Achievement: 11/04/21 Potential to Achieve Goals: Good Progress towards PT goals: Progressing toward goals    Frequency    Min 3X/week      PT Plan Current plan remains appropriate    Co-evaluation              AM-PAC PT "6 Clicks" Mobility   Outcome Measure  Help needed turning from your back to your side while in a flat bed without using bedrails?: None Help needed moving from lying on your back to sitting on the side of a flat bed without using bedrails?: A Little Help needed moving to and from a bed to a chair (including a wheelchair)?: A Little Help needed standing up from a chair using your arms (e.g., wheelchair or bedside chair)?: A Little Help needed to walk in hospital room?: A Little Help needed climbing 3-5 steps with a railing? : A Little 6 Click Score: 19    End of Session Equipment Utilized During Treatment: Gait belt Activity Tolerance: Patient tolerated treatment well Patient left: with call bell/phone within reach;with family/visitor present;in chair Nurse Communication: Mobility status PT Visit Diagnosis: Unsteadiness on feet (R26.81);Other abnormalities of gait and mobility (R26.89);Muscle weakness (generalized) (M62.81)     Time: 1430-1500 PT Time Calculation (min) (ACUTE ONLY): 30 min  Charges:  $Gait Training: 23-37 mins                     Benjiman Core, PTA Acute Rehab   Allena Katz 10/29/2021, 3:15 PM

## 2021-10-29 NOTE — Progress Notes (Signed)
PROGRESS NOTE    Rhonda Cook  QBH:[id number] DOB: January 27, 1946 DOA: 10/19/2021 PCP: [facility]    Brief Narrative:  76 year old female with past medical history of bladder cancer undergoing chemotherapy presented to the hospital with 1 week history of increasing shortness of breath, some cough but no fever or chills.  In the ED, patient was noted to have bilateral pleural effusion and very large left pleural effusion with  mediastinal lymphadenopathy.  There was some concern for paraspinal metastatic disease.  Patient was then considered for admission to the hospital for further evaluation and treatment.    During hospitalization, patient underwent thoracocentesis followed by chest tube placement.  PCCM following at this time.  Patient underwent bronchoscopy with endoscopic ultrasound and lymph node biopsy on 10/28/2021.  Assessment and Plan: Principal Problem:   Acute respiratory distress Active Problems:   Large loculated presumed malignant pleural effusion on left   Pleural effusion, right   Acute dyspnea   Bladder cancer on chemotherapy   Paraspinal soft tissue mass   Lung nodules   Painful bladder spasm   Lung nodule   Protein-calorie malnutrition, severe   Acute respiratory distress/dyspnea Symptomatically improved.  Likely secondary to COPD exacerbation, bilateral pleural effusion, large left pleural effusion.  IR was consulted and patient underwent ultrasound-guided thoracocentesis with removal of 240 mL of bloody fluid on 10/20/2021.  Pleural fluid culture negative from previous thoracocentesis.  On 10/20/2021 which showed atypical cells.    Blood cultures negative.  IR was again consulted and patient underwent IR guided pleural drain placement on 10/25/2021.    On IV steroids and bronchodilators.  Continue chest tube management as per pulmonary, currently under suction.  Repeat  pleural fluid cytology with atypical cells.  Patient underwent bronchoscopy with  endoscopic ultrasound lymph node biopsy on 10/28/2021.  Further management as per pulmonary.  We will get x-ray of the chest today due to increasing chest pain.  Large loculated likely malignant pleural effusion on left Status post thoracocentesis with bloody fluid.  CT scan showed some pleural thickening and loculation.  Pleural fluid cytology showing atypical cells. Status post IR guided chest tube placement.  Status post bronchoscopy 10/28/2021.  We will repeat chest x-ray due to increasing pain.  Paraspinal soft tissue mass Concerning for metastatic disease.  CT scan of the abdomen pelvis 10/19/2021 without any evidence of metastasis.  CT chest with paraspinal soft tissue.  History of bladder cancer and chemotherapy with Dr. Gilford Rile as outpatient.  Bladder cancer on chemotherapy Patient was seen by Dr. Gilford Rile as outpatient.  Dr. Lovena Neighbours believes that the patient has a localized bladder and unlikely to explain pulmonary findings.   Pleural effusion, right Small for thoracocentesis.  Nutrition Status: Severe malnutrition.  Present on admission. Nutrition Problem: Severe Malnutrition Etiology: cancer and cancer related treatments Signs/Symptoms: severe muscle depletion, severe fat depletion Interventions: Refer to RD note for recommendations      DVT prophylaxis: enoxaparin (LOVENOX) injection 40 mg Start: 10/20/21 1300 SCDs Start: 10/19/21 1332   Code Status:     Code Status: DNR  Disposition:  Home with home health in 2 to 3 days, await pulmonary recommendations  Status is: Inpatient  Remains inpatient appropriate because: Status post chest tube placement   Family Communication:  Communicated with the patient's husband at bedside on 10/28/2021  Consultants:  Interventional radiology Pulmonary Palliative care.  Procedures:  Ultrasound-guided thoracocentesis on 10/20/2021 IR guided pleural tube placement on 10/25/2021  Antimicrobials:  None  currently  Subjective: Today, patient was seen and examined at bedside.  Complains of chest pain, patient has been hooked up to suction at this time.  Denies increasing shortness of breath, nausea vomiting fever chills.  Objective: Vitals:   10/28/21 1723 10/28/21 2039 10/29/21 0450 10/29/21 0846  BP: (!) 121/59 124/62 (!) 125/59 (!) 124/56  Pulse: 95 88 82 85  Resp: 18 18 18  (!) 22  Temp: 97.9 F (36.6 C) 98 F (36.7 C) 97.7 F (36.5 C) 97.7 F (36.5 C)  TempSrc: Oral Oral Oral Oral  SpO2: 96% 97% 97% 98%  Weight:      Height:        Intake/Output Summary (Last 24 hours) at 10/29/2021 1451 Last data filed at 10/29/2021 1450 Gross per 24 hour  Intake 804.5 ml  Output 270 ml  Net 534.5 ml   Filed Weights   10/19/21 0817 10/28/21 1203  Weight: 59 kg 56.7 kg    Physical Examination:  Body mass index is 22.14 kg/m.   General:  Average built, not in obvious distress, Communicative, HENT:   No scleral pallor or icterus noted. Oral mucosa is moist.  Chest: Left-sided chest tube in place, diminished breath sounds. CVS: S1 &S2 heard. No murmur.  Regular rate and rhythm. Abdomen: Soft, nontender, nondistended.  Bowel sounds are heard.   Extremities: No cyanosis, clubbing or edema.  Peripheral pulses are palpable. Psych: Alert, awake and oriented, normal mood CNS:  No cranial nerve deficits.  Power equal in all extremities.   Skin: Warm and dry.  No rashes noted.  Data Reviewed:   CBC: Recent Labs  Lab 10/25/21 0125 10/26/21 0623 10/27/21 0145 10/28/21 0103 10/29/21 0138  WBC 12.2* 13.4* 12.6* 14.5* 13.6*  HGB 12.0 12.2 11.7* 11.9* 11.7*  HCT 35.4* 35.9* 34.9* 34.3* 34.4*  MCV 91.9 91.3 91.6 91.2 92.0  PLT 339 315 308 324 284    Basic Metabolic Panel: Recent Labs  Lab 10/26/21 0623 10/27/21 0145 10/28/21 0103 10/29/21 0138  NA 135 133* 131* 134*  K 4.1 4.5 4.4 4.7  CL 95* 97* 93* 96*  CO2 27 25 27 28   GLUCOSE 102* 110* 124* 124*  BUN 21 17 16 23    CREATININE 0.68 0.52 0.57 0.57  CALCIUM 8.8* 8.7* 8.6* 8.6*  MG 1.9 1.9  --   --     Liver Function Tests: No results for input(s): "AST", "ALT", "ALKPHOS", "BILITOT", "PROT", "ALBUMIN" in the last 168 hours.   Radiology Studies: No results found.    LOS: 10 days    Flora Lipps, MD [facility] Available via Epic secure chat 7am-7pm After these hours, please refer to coverage provider listed on [email or web address] 10/29/2021, 2:51 PM

## 2021-10-29 NOTE — Progress Notes (Signed)
PCCM:  Awaiting pathology report from bronchoscopy.  Little chest tube output  Can pull chest drain and place occlusive dressing   [facility] 10/29/2021 5:43 PM

## 2021-10-30 DIAGNOSIS — E43 Unspecified severe protein-calorie malnutrition: Secondary | ICD-10-CM

## 2021-10-30 DIAGNOSIS — J90 Pleural effusion, not elsewhere classified: Secondary | ICD-10-CM | POA: Diagnosis not present

## 2021-10-30 DIAGNOSIS — R0781 Pleurodynia: Secondary | ICD-10-CM | POA: Diagnosis not present

## 2021-10-30 DIAGNOSIS — Z515 Encounter for palliative care: Secondary | ICD-10-CM | POA: Diagnosis not present

## 2021-10-30 DIAGNOSIS — C679 Malignant neoplasm of bladder, unspecified: Secondary | ICD-10-CM | POA: Diagnosis not present

## 2021-10-30 DIAGNOSIS — R0600 Dyspnea, unspecified: Secondary | ICD-10-CM | POA: Diagnosis not present

## 2021-10-30 DIAGNOSIS — R0603 Acute respiratory distress: Secondary | ICD-10-CM | POA: Diagnosis not present

## 2021-10-30 LAB — CULTURE, BODY FLUID W GRAM STAIN -BOTTLE: Culture: NO GROWTH

## 2021-10-30 LAB — CBC
HCT: 33.7 % — ABNORMAL LOW (ref 36.0–46.0)
Hemoglobin: 11.5 g/dL — ABNORMAL LOW (ref 12.0–15.0)
MCH: 31.3 pg (ref 26.0–34.0)
MCHC: 34.1 g/dL (ref 30.0–36.0)
MCV: 91.6 fL (ref 80.0–100.0)
Platelets: 296 10*3/uL (ref 150–400)
RBC: 3.68 MIL/uL — ABNORMAL LOW (ref 3.87–5.11)
RDW: 13.5 % (ref 11.5–15.5)
WBC: 13.9 10*3/uL — ABNORMAL HIGH (ref 4.0–10.5)
nRBC: 0 % (ref 0.0–0.2)

## 2021-10-30 LAB — BASIC METABOLIC PANEL
Anion gap: 9 (ref 5–15)
BUN: 22 mg/dL (ref 8–23)
CO2: 26 mmol/L (ref 22–32)
Calcium: 8.2 mg/dL — ABNORMAL LOW (ref 8.9–10.3)
Chloride: 99 mmol/L (ref 98–111)
Creatinine, Ser: 0.61 mg/dL (ref 0.44–1.00)
GFR, Estimated: >60 mL/min (ref >60)
Glucose, Bld: 107 mg/dL — ABNORMAL HIGH (ref 70–99)
Potassium: 4.9 mmol/L (ref 3.5–5.1)
Sodium: 134 mmol/L — ABNORMAL LOW (ref 135–145)

## 2021-10-30 LAB — MAGNESIUM: Magnesium: 2.1 mg/dL (ref 1.7–2.4)

## 2021-10-30 MED ORDER — ADULT MULTIVITAMIN W/MINERALS CH: 1.0000 | ORAL_TABLET | Freq: Every day | ORAL | Status: AC

## 2021-10-30 MED ORDER — POLYETHYLENE GLYCOL 3350 17 G PO PACK: 17.0000 g | PACK | Freq: Every day | ORAL | 0 refills | Status: AC | PRN

## 2021-10-30 MED ORDER — HYDROCODONE-ACETAMINOPHEN 10-325 MG PO TABS
1.0000 | ORAL_TABLET | ORAL | Status: DC | PRN
  Administered 2021-10-30 (×2): 1 via ORAL
  Filled 2021-10-30 (×2): qty 1

## 2021-10-30 MED ORDER — PREDNISONE 20 MG PO TABS: 40.0000 mg | ORAL_TABLET | Freq: Every day | ORAL | 0 refills | Status: AC

## 2021-10-30 MED ORDER — HYDROCODONE-ACETAMINOPHEN 5-325 MG PO TABS: 1.0000 | ORAL_TABLET | ORAL | 0 refills | Status: DC | PRN

## 2021-10-30 NOTE — Progress Notes (Signed)
Discharge instructions provided to patient, patient verbalizes understanding. Ivs removed, medications reviewed. Belongings collected, patient discharged

## 2021-10-30 NOTE — Discharge Summary (Signed)
Physician Discharge Summary   Patient: Rhonda Cook MRN: [id number] DOB: 1945/07/05  Admit date:     10/19/2021  Discharge date: 10/30/21  Discharge Physician: Flora Lipps   PCP: [facility]   Recommendations at discharge:   Follow-up with Dr. Julien Nordmann oncology as outpatient for possible lung cancer, pleural fluid cytology suggestive of malignant cells. Follow-up with primary care physician in 1 week.   Check CBC BMP magnesium LFT in the next visit.  Discharge Diagnoses: Active Problems:   Large loculated presumed malignant pleural effusion on left   Pleural effusion, right   Bladder cancer on chemotherapy   Paraspinal soft tissue mass   Lung nodules   Painful bladder spasm   Protein-calorie malnutrition, severe  Principal Problem (Resolved):   Acute respiratory distress Resolved Problems:   Acute dyspnea  Hospital Course: 76 year old female with past medical history of bladder cancer undergoing chemotherapy presented to the hospital with 1 week history of increasing shortness of breath, some cough but no fever or chills.  In the ED, patient was noted to have bilateral pleural effusion and very large left pleural effusion with  mediastinal lymphadenopathy.  There was some concern for paraspinal metastatic disease.  Patient was then considered for admission to the hospital for further evaluation and treatment.  During hospitalization, patient underwent thoracocentesis followed by chest tube placement. Pulmonary followed the patient during hospitalization and patient underwent bronchoscopy with endoscopic ultrasound and lymph node biopsy on 10/28/2021.  Chest tube was subsequently removed on 10/29/2021 and patient remained stable afterwards.  Following conditions were addressed during hospitalization,  Acute respiratory distress/dyspnea Symptomatically improved.  Thought to be multifactorial secondary to COPD exacerbation, bilateral pleural effusion, large left  pleural effusion.  IR was consulted and patient underwent ultrasound-guided thoracocentesis with removal of 240 mL of bloody fluid on 10/20/2021.  Pleural fluid culture was negative but showed atypical cells.    Blood cultures meant negative.  IR was again consulted and patient underwent IR guided pleural drain placement on 10/25/2021.    Pulmonary followed the patient during hospitalization and chest tube has been removed 10/29/2021.  Patient also received IV steroids and bronchodilators during hospitalization which will be changed to p.o. prednisone for next 3 days on discharge.   Patient also underwent bronchoscopy with endoscopic ultrasound lymph node biopsy on 10/28/2021 since there was more concern for pulmonary malignancy as primary.  Communicated with Dr. Valeta Harms pulmonary who recommends follow-up with Dr. Julien Nordmann, oncology on discharge.  Medicated with the multiple family members at bedside.  Large loculated likely malignant pleural effusion on left Status post thoracocentesis with bloody fluid.  CT scan showed some pleural thickening and loculation.  Pleural fluid cytology showing atypical cells x2.  Status post IR guided chest tube placement and  bronchoscopy with biopsy 10/28/2021.  At this time patient has clinically and radiographically improved.  Will need to follow-up with oncology on discharge.  Paraspinal soft tissue mass Concerning for metastatic disease.  CT scan of the abdomen pelvis 10/19/2021 without any evidence of metastasis.  CT chest with paraspinal soft tissue.  History of bladder cancer and chemotherapy with Dr. Gilford Rile as outpatient.  Bladder cancer on chemotherapy Patient was seen by Dr. Gilford Rile as outpatient.  Dr. Lovena Neighbours believes that the patient has a localized bladder and unlikely to explain pulmonary findings.  Will need to follow-up with Dr. Gilford Rile as outpatient.   Pleural effusion, right Small for thoracocentesis.  Nutrition Status: Severe malnutrition.  Present on  admission.  Received nutritional  supplements during hospitalization. Nutrition Problem: Severe Malnutrition Etiology: cancer and cancer related treatments Signs/Symptoms: severe muscle depletion, severe fat depletion Interventions: Refer to RD note for recommendations    Consultants:  Interventional radiology Pulmonary Palliative care.   Procedures performed:  Ultrasound-guided thoracocentesis on 10/20/2021 IR guided pleural tube placement on 10/25/2021  Chest tube removal 10/29/2021. Fiberoptic bronchoscopy with endobronchial ultrasound and biopsy on 10/28/2021.  Disposition: Home.  Communicated with multiple family members at bedside.  Diet recommendation:  Discharge Diet Orders (From admission, onward)     Start     Ordered   10/30/21 0000  Diet general        10/30/21 1150           Regular diet DISCHARGE MEDICATION: Allergies as of 10/30/2021       Reactions   Oxycodone Itching, Nausea And Vomiting   Adhesive [tape] Rash   Penicillins Nausea And Vomiting, Rash   Did it involve swelling of the face/tongue/throat, SOB, or low BP? no Did it involve sudden or severe rash/hives, skin peeling, or any reaction on the inside of your mouth or nose? yes Did you need to seek medical attention at a hospital or doctor's office? no When did it last happen?  20 years ago     If all above answers are "NO", may proceed with cephalosporin use.        Medication List     TAKE these medications    aspirin EC 81 MG tablet Take 1 tablet (81 mg total) by mouth daily.   CALCIUM PO Take 1 tablet by mouth daily.   celecoxib 200 MG capsule Commonly known as: CELEBREX Take 200 mg by mouth 2 (two) times daily.   cholecalciferol 25 MCG (1000 UNIT) tablet Commonly known as: VITAMIN D3 Take 1,000 Units by mouth daily.   docusate sodium 100 MG capsule Commonly known as: COLACE Take 100 mg by mouth at bedtime.   famotidine 40 MG tablet Commonly known as: PEPCID Take 1 tablet by  mouth daily.   gabapentin 300 MG capsule Commonly known as: NEURONTIN Take 300 mg by mouth at bedtime.   HYDROcodone-acetaminophen 5-325 MG tablet Commonly known as: Norco Take 1 tablet by mouth every 4 (four) hours as needed for up to 5 days for moderate pain.   ibuprofen 200 MG tablet Commonly known as: ADVIL Take 800 mg by mouth daily as needed for headache or moderate pain.   IRON PO Take 1 tablet by mouth daily.   levothyroxine 75 MCG tablet Commonly known as: SYNTHROID Take 75 mcg by mouth daily.   lisinopril-hydrochlorothiazide 10-12.5 MG tablet Commonly known as: ZESTORETIC Take 1 tablet by mouth daily.   multivitamin with minerals Tabs tablet Take 1 tablet by mouth daily. Start taking on: October 31, 2021   ondansetron 4 MG tablet Commonly known as: ZOFRAN Take 1 tablet (4 mg total) by mouth every 6 (six) hours. As needed for nausea and vomiting   polyethylene glycol 17 g packet Commonly known as: MIRALAX / GLYCOLAX Take 17 g by mouth daily as needed for mild constipation or moderate constipation.   predniSONE 20 MG tablet Commonly known as: DELTASONE Take 2 tablets (40 mg total) by mouth daily with breakfast for 3 days.   albuterol (2.5 MG/3ML) 0.083% nebulizer solution Commonly known as: PROVENTIL Take 2.5 mg by nebulization every 6 (six) hours as needed for wheezing or shortness of breath.   ProAir HFA 108 (90 Base) MCG/ACT inhaler Generic drug: albuterol Inhale 2 puffs  into the lungs every 4 (four) hours as needed for wheezing or shortness of breath.   PROBIOTIC-10 PO Take 1 capsule by mouth daily.   promethazine 12.5 MG tablet Commonly known as: PHENERGAN Take 12.5 mg by mouth 3 (three) times daily as needed.   simvastatin 20 MG tablet Commonly known as: ZOCOR Take 20 mg by mouth at bedtime.   VISINE OP Place 1 drop into both eyes daily as needed (dry eyes).        Follow-up Information     [facility] Follow up.   Specialty:  Home Health Services Contact information: [place] [address] [place], [facility] Follow up in 1 week(s).   Specialty: Family Medicine Contact information: [address] [place] [address] [phone]         Curt Bears, MD. Schedule an appointment as soon as possible for a visit in 1 week(s).   Specialty: Oncology Why: to discuss about further treatment plan Contact information: [place] [address] [phone]         [place] [place] [facility] Follow up in 1 week(s).   Specialty: Family Medicine Contact information: [place] [address] [phone]                Subjective Recall patient was seen and examined at bedside.  Complains of feeling overall better.  No shortness of breath or dyspnea mild discomfort at the chest wall/soreness.  Multiple family members at bedside.  Discharge Exam: Filed Weights   10/19/21 0817 10/28/21 1203  Weight: 59 kg 56.7 kg      10/30/2021    7:44 AM 10/30/2021    4:44 AM 10/29/2021    4:13 PM  Vitals with BMI  Systolic 427 062 376  Diastolic 50 56 64  Pulse 86 83 82   Body mass index is 22.14 kg/m.   General:  Average built, not in obvious distress HENT:   No scleral pallor or icterus noted. Oral mucosa is moist.  Chest:   Diminished breath sounds bilaterally. No crackles or wheezes.  CVS: S1 &S2 heard. No murmur.  Regular rate and rhythm. Abdomen: Soft, nontender, nondistended.  Bowel sounds are heard.   Extremities: No cyanosis, clubbing or edema.  Peripheral pulses are palpable. Psych: Alert, awake and oriented, normal mood CNS:  No cranial nerve deficits.  Power equal in all extremities.   Skin: Warm and dry.  No rashes noted.   Condition at discharge: good  The results of significant diagnostics from this hospitalization (including imaging, microbiology, ancillary  and laboratory) are listed below for reference.   Imaging Studies: DG Chest Port 1 View  Result Date: 10/29/2021 CLINICAL DATA:  Pt c/o sob after having chest tube removed from the left side of her chest within the last hour. States she is beginning to calm down after having it removed but still feels a little sob. EXAM: PORTABLE CHEST 1 VIEW COMPARISON:  10/26/2021 and older studies.  CT, 10/19/2021. FINDINGS: Left-sided pigtail chest tube has been removed. There is a small left inferolateral hemithorax pneumothorax at the site of the prior chest tube. There is surrounding opacity at the left lung base obscuring the hemidiaphragm consistent with a combination of residual pleural fluid and atelectasis, decreased from the prior exam. No apical pneumothorax. Right lung is hyperexpanded. The opacity in the right lower lung has decreased  consistent with a decrease in pleural fluid and atelectasis. Other small areas of hazy airspace opacity persist. No new right lung abnormalities. No right pneumothorax. IMPRESSION: 1. Status post removal of the left inferior me thorax chest tube. 2. Small loculated left lateral and inferior pneumothorax. 3. Decreased opacity at the left lung base from the most recent prior exam. Residual opacity consistent with a combination of a small residual effusion and atelectasis. 4. Improved aeration in the right lung base consistent with a decrease in right pleural fluid and atelectasis. Electronically Signed   By: Lajean Manes M.D.   On: 10/29/2021 18:46   DG CHEST PORT 1 VIEW  Result Date: 10/26/2021 CLINICAL DATA:  Left pleural effusion.  Chest tube. EXAM: PORTABLE CHEST 1 VIEW COMPARISON:  Chest radiograph October 20, 2021. FINDINGS: Similar moderate left pleural effusion with left chest tube in place. No visible pneumothorax. Similar small right pleural effusion. Similar overlying bibasilar opacities. Unchanged cardiomediastinal silhouette, partially obscured. Cholecystectomy clips.  IMPRESSION: 1. Similar moderate left pleural effusion with left chest tube in place. No visible pneumothorax. 2. Similar small right pleural effusion. 3. Similar overlying bibasilar atelectasis and/or consolidation. Electronically Signed   By: Margaretha Sheffield M.D.   On: 10/26/2021 08:12   IR PERC PLEURAL DRAIN W/INDWELL CATH W/IMG GUIDE  Result Date: 10/25/2021 INDICATION: 76 year old with loculated left pleural effusion. Request for chest tube placement. EXAM: PLACEMENT OF LEFT CHEST TUBE WITH ULTRASOUND AND FLUOROSCOPIC GUIDANCE MEDICATIONS: Moderate sedation ANESTHESIA/SEDATION: Moderate (conscious) sedation was employed during this procedure. A total of Versed 2.5 mg and Fentanyl 100 mcg was administered intravenously by the radiology nurse. Total intra-service moderate Sedation Time: 23 minutes. The patient's level of consciousness and vital signs were monitored continuously by radiology nursing throughout the procedure under my direct supervision. COMPLICATIONS: None immediate. PROCEDURE: Informed written consent was obtained from the patient after a thorough discussion of the procedural risks, benefits and alternatives. All questions were addressed.A timeout was performed prior to the initiation of the procedure. Patient was rolled onto her right side. The left mid axillary region was prepped and draped in sterile fashion. Maximal barrier sterile technique was utilized including caps, mask, sterile gowns, sterile gloves, sterile drape, hand hygiene and skin antiseptic. Ultrasound demonstrated a loculated left pleural effusion. Skin was anesthetized with 1% lidocaine. A small incision was made. Using ultrasound guidance, a Yueh catheter was directed into the loculated fluid and dark red pleural fluid was aspirated. Superstiff Amplatz wire was advanced into the pleural space. The tract was dilated to accommodate a 14 [place] multipurpose drain. Additional dark red fluid was collected for analysis. Drain  was sutured to skin and attached to a chest tube drainage system. Bandage was placed. Fluoroscopic and ultrasound images were taken and saved for documentation. FINDINGS: Left pleural fluid is very loculated with multiple septations. 37 French drain was advanced into the pleural space and dark red fluid was removed. IMPRESSION: Placement of left chest tube using ultrasound and fluoroscopic guidance. Left pleural effusion is diffusely loculated. Electronically Signed   By: Markus Daft M.D.   On: 10/25/2021 17:31   US THORACENTESIS ASP PLEURAL SPACE W/IMG GUIDE  Result Date: 10/20/2021 INDICATION: Left loculated pleural effusion EXAM: ULTRASOUND GUIDED LEFT THORACENTESIS MEDICATIONS: 10 cc 1% lidocaine. COMPLICATIONS: None immediate. PROCEDURE: An ultrasound guided thoracentesis was thoroughly discussed with the patient and questions answered. The benefits, risks, alternatives and complications were also discussed. The patient understands and wishes to proceed with the procedure. Written consent was obtained. Ultrasound was  performed to localize and mark an adequate pocket of fluid in the left chest. The area was then prepped and draped in the normal sterile fashion. 1% Lidocaine was used for local anesthesia. Under ultrasound guidance a Yueh catheter was introduced. Thoracentesis was performed. The catheter was removed and a dressing applied. FINDINGS: A total of approximately 240 cc of bloody fluid was removed. Samples were sent to the laboratory as requested by the clinical team. IMPRESSION: Successful ultrasound guided left thoracentesis yielding of pleural fluid. CXR pending Read by Lavonia Drafts [facility] Electronically Signed   By: Lavonia Dana M.D.   On: 10/20/2021 12:07   DG Chest 1 View  Result Date: 10/20/2021 CLINICAL DATA:  Post left thoracentesis. EXAM: CHEST  1 VIEW COMPARISON:  10/19/2021 FINDINGS: 0923 hours. Interval decrease left pleural effusion without evidence for left-sided pneumothorax. Small  right pleural effusion is similar. There is persistent bibasilar collapse/consolidation. Pleural base nodular opacities in the right lung evident, as seen on recent chest CT. Stable cardiopericardial silhouette. The visualized bony structures of the thorax are unremarkable. IMPRESSION: No evidence for pneumothorax after left thoracentesis with decrease in left pleural fluid collection. Electronically Signed   By: Misty Stanley M.D.   On: 10/20/2021 10:05   [place] CHEST (PLEURAL EFFUSION)  Result Date: 10/19/2021 CLINICAL DATA:  Pleural effusions EXAM: CHEST ULTRASOUND COMPARISON:  Chest radiograph 10/19/2021 FINDINGS: Highly loculated moderate-sized LEFT pleural effusion identified with numerous septations and scattered debris. Small uncomplicated appearing RIGHT pleural effusion. IMPRESSION: Moderate-sized LEFT pleural effusion which is highly loculated by numerous septations. Small RIGHT simple appearing RIGHT pleural effusion. Electronically Signed   By: Lavonia Dana M.D.   On: 10/19/2021 14:59   CT ABDOMEN PELVIS W CONTRAST  Result Date: 10/19/2021 CLINICAL DATA:  Metastatic disease evaluation, abnormal chest CT, known bladder cancer * Tracking Code: BO * EXAM: CT ABDOMEN AND PELVIS WITH CONTRAST TECHNIQUE: Multidetector CT imaging of the abdomen and pelvis was performed using the standard protocol following bolus administration of intravenous contrast. RADIATION DOSE REDUCTION: This exam was performed according to the departmental dose-optimization program which includes automated exposure control, adjustment of the mA and/or kV according to patient size and/or use of iterative reconstruction technique. CONTRAST:  59mL OMNIPAQUE IOHEXOL 300 MG/ML  SOLN COMPARISON:  CT chest, 10/19/2021, CT abdomen pelvis, 05/13/2018 FINDINGS: Lower chest: Large left, moderate right pleural effusions and associated pleural thickening and nodularity. Please see prior same day CT chest. Hepatobiliary: No focal liver abnormality  is seen. Status post cholecystectomy. Mild postop biliary dilatation. Pancreas: Atrophic appearance of the pancreatic tail with some adjacent fat stranding (series 2, image 27). No pancreatic ductal dilatation. Spleen: Normal in size without significant abnormality. Adrenals/Urinary Tract: Stable, definitively benign left adrenal adenomata (series 2, image 23). Kidneys are normal, without renal calculi, solid lesion, or hydronephrosis. Bladder is unremarkable. Stomach/Bowel: Stomach is within normal limits. Appendix is not clearly visualized. No evidence of bowel wall thickening, distention, or inflammatory changes. Descending and sigmoid diverticulosis. Vascular/Lymphatic: Aortic atherosclerosis. 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 No enlarged abdominal or pelvic lymph nodes. Reproductive: No mass or other significant abnormality. Other: No abdominal wall hernia or abnormality. Small volume ascites in the low pelvis (series 2, image 70). Musculoskeletal: No acute or significant osseous findings. IMPRESSION: 1. No evidence of metastatic disease in the abdomen or pelvis. 2. Stable, definitively benign left adrenal adenomata, unchanged in comparison to examination dated 05/13/2018 and corresponding to queried paraspinous soft tissue thickening seen by prior  examination of the chest. 3. Atrophic appearance of the pancreatic tail with some adjacent fat stranding, most consistent with sequelae of pancreatitis seen acutely on prior examination dated 05/13/2018. 4. 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 5. Small volume ascites in the low pelvis. 6. Please see separately reported same day examination of the chest for complete findings. Aortic Atherosclerosis (ICD10-I70.0). Electronically Signed   By: Delanna Ahmadi M.D.   On: 10/19/2021 13:46   CT Chest W Contrast  Result Date: 10/19/2021 CLINICAL DATA:  Shortness  breath EXAM: CT CHEST WITH CONTRAST TECHNIQUE: Multidetector CT imaging of the chest was performed during intravenous contrast administration. RADIATION DOSE REDUCTION: This exam was performed according to the departmental dose-optimization program which includes automated exposure control, adjustment of the mA and/or kV according to patient size and/or use of iterative reconstruction technique. CONTRAST:  37mL OMNIPAQUE IOHEXOL 300 MG/ML  SOLN COMPARISON:  Chest x-ray dated October 19, 2021; CT of the abdomen and pelvis dated May 03, 2018. FINDINGS: Cardiovascular: Normal heart size. No pericardial effusion. Atherosclerotic disease of the thoracic aorta. No definite coronary artery calcifications. Mediastinum/Nodes: Esophagus and thyroid are unremarkable. Enlarged mediastinal lymph nodes. Reference precarinal lymph node measuring 1.4 cm in short axis on series 2, image 84. Lungs/Pleura: Central airways are patent. Centrilobular emphysema. Bilateral nodular pleural thickening and perifissural nodules. Reference right pleural nodule measuring 9 mm on series 2, image 44. Irregular juxtapleural solid nodule of the left upper lobe measuring 1.5 x 1.0 cm. Additional subpleural nodule of the inferior left upper lobe measuring 1.7 x 1.2 cm on series 4, image 63. Small right pleural effusion and loculated moderate left pleural effusion. Near complete collapse of the left lower lobe. Upper Abdomen: Fullness of the pancreatic tail with adjacent fluid and numerous perisplenic varices. Left adrenal gland nodule measuring up to 1.5 cm, unchanged in size when compared with prior CT. Left paraspinal soft tissue thickening, measuring up to 1.6 cm. Musculoskeletal: No chest wall abnormality. No acute or significant osseous findings. IMPRESSION: 1. Small right pleural effusion and loculated moderate left pleural effusion with associated pleural thickening, numerous pleural and perifissural nodules and left upper lobe subpleural  parenchymal nodules. Differential considerations include metastatic disease related to patient's bladder cancer versus primary lung malignancy. 2. Enlarged mediastinal lymph nodes, likely due to nodal metastatic disease. 3. Asymmetric left paraspinous soft tissue thickening, concerning for additional site of metastatic disease. Recommend contrast-enhanced CT of the abdomen and pelvis for further evaluation. 4. 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 Recommend attention on CT. 5. Aortic Atherosclerosis (ICD10-I70.0) and Emphysema (ICD10-J43.9). Electronically Signed   By: Yetta Glassman M.D.   On: 10/19/2021 12:10   DG Chest Port 1 View  Result Date: 10/19/2021 CLINICAL DATA:  Cough, shortness of breath, history COPD, hypertension, smoker, bladder cancer EXAM: PORTABLE CHEST 1 VIEW COMPARISON:  Portable exam 0832 hours compared to 03/09/2017 FINDINGS: Normal heart size, mediastinal contours, and pulmonary vascularity. Atherosclerotic calcification aorta. New BILATERAL pleural effusions, small RIGHT, moderate LEFT. Bibasilar atelectasis greater on LEFT. No definite infiltrate or pneumothorax. Questionable nodular foci RIGHT lung. No acute osseous findings. IMPRESSION: BILATERAL pleural effusions and bibasilar atelectasis much greater on LEFT. Question nodular foci RIGHT lung. Further evaluation by CT chest with contrast recommended. Aortic Atherosclerosis (ICD10-I70.0). Electronically Signed   By: Lavonia Dana M.D.   On: 10/19/2021 08:49   DG Lumbar Spine Complete  Result Date: 10/10/2021 CLINICAL DATA:  Left low back pain EXAM: LUMBAR SPINE - COMPLETE 4 VIEW COMPARISON:  Lumbar spine radiograph dated July 08, 2020 FINDINGS: Vertebral body heights are well-maintained. Mild-to-moderate multilevel degenerative disc disease, most pronounced at L2-L3 where there is disc space height  loss. Moderate facet arthropathy of the lower lumbar spine. Aortoiliac atherosclerotic calcifications. IMPRESSION: Mild-to-moderate degenerative disc disease, unchanged when compared to prior exam. Electronically Signed   By: Yetta Glassman M.D.   On: 10/10/2021 10:55    Microbiology: Results for orders placed or performed during the hospital encounter of 10/19/21  Blood culture (routine x 2)     Status: None   Collection Time: 10/19/21  8:13 AM   Specimen: BLOOD RIGHT FOREARM  Result Value Ref Range Status   Specimen Description BLOOD RIGHT FOREARM  Final   Special Requests   Final    Immunocompromised BOTTLES DRAWN AEROBIC AND ANAEROBIC Blood Culture adequate volume   Culture   Final    NO GROWTH 5 DAYS Performed at [facility], [address] [place], [place] [address]    Report Status 10/24/2021 FINAL  Final  Resp Panel by RT-PCR (Flu A&B, Covid) Anterior Nasal Swab     Status: None   Collection Time: 10/19/21  8:13 AM   Specimen: Anterior Nasal Swab  Result Value Ref Range Status   SARS Coronavirus 2 by RT PCR NEGATIVE NEGATIVE Final    Comment: (NOTE) SARS-CoV-2 target nucleic acids are NOT DETECTED.  The SARS-CoV-2 RNA is generally detectable in upper respiratory specimens during the acute phase of infection. The lowest concentration of SARS-CoV-2 viral copies this assay can detect is 138 copies/mL. A negative result does not preclude SARS-Cov-2 infection and should not be used as the sole basis for treatment or other patient management decisions. A negative result may occur with  improper specimen collection/handling, submission of specimen other than nasopharyngeal swab, presence of viral mutation(s) within the areas targeted by this assay, and inadequate number of viral copies(<138 copies/mL). A negative result must be combined with clinical observations, patient history, and epidemiological information. The expected result is Negative.  Fact Sheet for Patients:   EntrepreneurPulse.com.au  Fact Sheet for Healthcare Providers:  IncredibleEmployment.be  This test is no t yet approved or cleared by the [place] FDA and  has been authorized for detection and/or diagnosis of SARS-CoV-2 by FDA under an Emergency Use Authorization (EUA). This EUA will remain  in effect (meaning this test can be used) for the duration of the COVID-19 declaration under Section 564(b)(1) of the Act, 21 U.S.C.section 360bbb-3(b)(1), unless the authorization is terminated  or revoked sooner.       Influenza A by PCR NEGATIVE NEGATIVE Final   Influenza B by PCR NEGATIVE NEGATIVE Final    Comment: (NOTE) The Xpert Xpress SARS-CoV-2/FLU/RSV plus assay is intended as an aid in the diagnosis of influenza from Nasopharyngeal swab specimens and should not be used as a sole basis for treatment. Nasal washings and aspirates are unacceptable for Xpert Xpress SARS-CoV-2/FLU/RSV testing.  Fact Sheet for Patients: EntrepreneurPulse.com.au  Fact Sheet for Healthcare Providers: IncredibleEmployment.be  This test is not yet approved or cleared by the [place] FDA and has been authorized for detection and/or diagnosis of SARS-CoV-2 by FDA under an Emergency Use Authorization (EUA). This EUA will remain in effect (meaning this test can be used) for the duration of the COVID-19 declaration under Section 564(b)(1) of the Act, 21 U.S.C. section 360bbb-3(b)(1), unless the authorization is terminated  or revoked.  Performed at [facility], [address] [place], [place] [address]   Blood culture (routine x 2)     Status: None   Collection Time: 10/19/21  8:18 AM   Specimen: BLOOD RIGHT HAND  Result Value Ref Range Status   Specimen Description BLOOD RIGHT HAND  Final   Special Requests   Final    Immunocompromised BOTTLES DRAWN AEROBIC AND ANAEROBIC Blood Culture results may not be optimal due to an  inadequate volume of blood received in culture bottles   Culture   Final    NO GROWTH 5 DAYS Performed at [facility], [address] [place], [place] [address]    Report Status 10/24/2021 FINAL  Final  Urine Culture     Status: None   Collection Time: 10/19/21  6:25 PM   Specimen: Urine, Clean Catch  Result Value Ref Range Status   Specimen Description   Final    URINE, CLEAN CATCH Performed at [facility], [address] [place], [place] [address]    Special Requests   Final    NONE Performed at [facility], [address] [place], [place] [address]    Culture   Final    NO GROWTH Performed at [facility], [place] [address], [place], [place] [address]    Report Status 10/20/2021 FINAL  Final  Gram stain     Status: None   Collection Time: 10/20/21  9:00 AM   Specimen: PATH Cytology Peritoneal fluid  Result Value Ref Range Status   Specimen Description PLEURAL  Final   Special Requests NONE  Final   Gram Stain   Final    NO ORGANISMS SEEN WBC PRESENT,BOTH PMN AND MONONUCLEAR CYTOSPIN SMEAR Performed at [facility], [address] [place], [place] [address]    Report Status 10/20/2021 FINAL  Final  Culture, body fluid w Gram Stain-bottle     Status: None   Collection Time: 10/20/21  9:00 AM   Specimen: Pleura  Result Value Ref Range Status   Specimen Description PLEURAL  Final   Special Requests BOTTLES DRAWN AEROBIC AND ANAEROBIC 10CC  Final   Culture   Final    NO GROWTH 5 DAYS Performed at [facility], [address], [place], [place] [address]    Report Status 10/25/2021 FINAL  Final  Gram stain     Status: None   Collection Time: 10/25/21 12:21 PM   Specimen: Lung, Left; Pleural Fluid  Result Value Ref Range Status   Specimen Description PLEURAL  Final   Special Requests LEFT LUNG  Final   Gram Stain   Final    NO WBC SEEN NO ORGANISMS SEEN Performed at [facility], [place] [address] [place], [place] [address]    Report Status 10/25/2021 FINAL   Final  Culture, fungus without smear     Status: None (Preliminary result)   Collection Time: 10/25/21 12:21 PM   Specimen: Lung, Left; Pleural Fluid  Result Value Ref Range Status   Specimen Description PLEURAL  Final   Special Requests LEFT LUNG  Final   Culture   Final    NO FUNGUS ISOLATED AFTER 5 DAYS Performed at [place] [address], [place], [place] [address]    Report Status PENDING  Incomplete  Culture, body fluid w Gram Stain-bottle     Status: None   Collection Time: 10/25/21 12:21 PM   Specimen: Pleura  Result Value Ref Range Status  Specimen Description PLEURAL  Final   Special Requests LEFT LUNG  Final   Culture   Final    NO GROWTH 5 DAYS Performed at [place] [address] [place], [place] [address]    Report Status 10/30/2021 FINAL  Final    Labs: CBC: Recent Labs  Lab 10/26/21 0623 10/27/21 0145 10/28/21 0103 10/29/21 0138 10/30/21 0015  WBC 13.4* 12.6* 14.5* 13.6* 13.9*  HGB 12.2 11.7* 11.9* 11.7* 11.5*  HCT 35.9* 34.9* 34.3* 34.4* 33.7*  MCV 91.3 91.6 91.2 92.0 91.6  PLT 315 308 324 327 833   Basic Metabolic Panel: Recent Labs  Lab 10/26/21 0623 10/27/21 0145 10/28/21 0103 10/29/21 0138 10/30/21 0015  NA 135 133* 131* 134* 134*  K 4.1 4.5 4.4 4.7 4.9  CL 95* 97* 93* 96* 99  CO2 27 25 27 28 26   GLUCOSE 102* 110* 124* 124* 107*  BUN 21 17 16 23 22   CREATININE 0.68 0.52 0.57 0.57 0.61  CALCIUM 8.8* 8.7* 8.6* 8.6* 8.2*  MG 1.9 1.9  --   --  2.1   Liver Function Tests: No results for input(s): "AST", "ALT", "ALKPHOS", "BILITOT", "PROT", "ALBUMIN" in the last 168 hours. CBG: No results for input(s): "GLUCAP" in the last 168 hours.  Discharge time spent: greater than 30 minutes.  Signed: Flora Lipps, MD [facility] 10/30/2021

## 2021-10-30 NOTE — TOC Transition Note (Signed)
Transition of Care [facility]) - CM/SW Discharge Note   Patient Details  Name: Rhonda Cook MRN: [id number] Date of Birth: 1945-07-26  Transition of Care [facility]) CM/SW Contact:  Bartholomew Crews, RN Phone Number: [phone] 10/30/2021, 12:02 PM   Clinical Narrative:     Patient to transition home today. Previous RNCM arranged for [facility] PT with [place] - notified liaison, Tommi Rumps, of transition home today. No further TOC needs identified at this time.   Final next level of care: [place] Barriers to Discharge: No Barriers Identified   Patient Goals and CMS Choice Patient states their goals for this hospitalization and ongoing recovery are:: to return to home CMS Medicare.gov Compare Post Acute Care list provided to:: Patient Choice offered to / list presented to : Patient  Discharge Placement                       Discharge Plan and Services   Discharge Planning Services: CM Consult Post Acute Care Choice: Home Health            DME Agency: NA       HH Arranged: PT HH Agency: [facility] Date [place]: 10/30/21 Time [place]: 1202 Representative spoke with at [place]: Tommi Rumps  Social Determinants of Health ([place]) Interventions     Readmission Risk Interventions     No data to display

## 2021-10-30 NOTE — Progress Notes (Signed)
Patient is s/p left chest tube placement for loculated pleural effusion on 10/25/21.   Chest tube has been managed by PCCM after placement, CT removed on 7/1. IR will sign off, please call for questions and concerns.    Armando Gang Kamyra Schroeck PA-C 10/30/2021 8:49 AM

## 2021-10-30 NOTE — Progress Notes (Addendum)
   Palliative Medicine Inpatient Follow Up Note   HPI: 76 y.o. female  with past medical history of longtime smoker with COPD, currently being treated for bladder cancer with trusted urologist, Dr. Lovena Neighbours, autoimmune thyroiditis, arthritis, HTN/HLD, osteoporosis, admitted on 10/19/2021 with acute respiratory distress COPD exacerbation with ongoing tobacco use and large left and right pleural effusions.  Thoracentesis left lung 6/22 240cc bloody fluid sent for analysis.   Today's Discussion 10/30/2021  *Please note that this is a verbal dictation therefore any spelling or grammatical errors are due to the "[place]" system interpretation.  Chart reviewed inclusive of vital signs, progress notes, laboratory results, and diagnostic images.   I met with Rhonda Cook this morning at bedside.  She expresses that she is feeling better than yesterday as the chest tube was removed last evening.  We reviewed that she is still having quite a bit of pain for which Norco is not quite helping.  She shares that the Dilaudid does help for short period of time.  I shared that I will change her Norco dosing to more sufficiently manage her pain.  Rhonda Cook plans to get up and mobilize today down the halls.  She again is quite hopeful that she will be able to transition home before the holiday.  We reviewed the importance of verifying her pain is well managed as we do not want her to get home and to going to an exacerbation of discomfort.  Rhonda Cook is very understanding of her present situation and thankful for the support provided.  Questions and concerns addressed   Palliative Support Provided  Objective Assessment: Vital Signs Vitals:   10/30/21 0444 10/30/21 0744  BP: (!) 127/56 (!) 118/50  Pulse: 83 86  Resp: 19 18  Temp: 97.9 F (36.6 C) 97.6 F (36.4 C)  SpO2: 92% 92%    Intake/Output Summary (Last 24 hours) at 10/30/2021 1120 Last data filed at 10/29/2021 1901 Gross per 24 hour  Intake 460 ml  Output 85  ml  Net 375 ml    Last Weight  Most recent update: 10/28/2021 12:05 PM    Weight  56.7 kg (125 lb)            Gen:  Elderly Caucasian F  HEENT: moist mucous membranes CV: Regular rate and rhythm PULM: On RA, breathing is even and nonlabored, (+) L chest tube ABD: soft/nontender EXT: No edema Neuro: Alert and oriented x3  SUMMARY OF RECOMMENDATIONS   DNAR/DNI  Continue to treat the treatable   Pleuritic Pain --> Continue dilaudid for breakthrough pain, norco increased to $RemoveBefo'10mg'BhxwoPdWDcn$ /$'325mg'V$  tablet  Follow-up with trusted oncologist, Dr. Gilford Rile after discharge  Ongoing conversation upon discharge regarding next steps ______________________________________________________________________________________ [place] Team Team Cell Phone: [phone] Please utilize secure chat with additional questions, if there is no response within 30 minutes please call the above phone number  Palliative Medicine Team providers are available by phone from 7am to 7pm daily and can be reached through the team cell phone.  Should this patient require assistance outside of these hours, please call the patient's attending physician.

## 2021-10-31 ENCOUNTER — Telehealth: Payer: Self-pay

## 2021-10-31 ENCOUNTER — Telehealth: Payer: Self-pay | Admitting: Internal Medicine

## 2021-10-31 NOTE — Telephone Encounter (Signed)
R/s pt's new pt appt to an earlier date per 7/3 staff msg from [employer]. Spoke to pt's husband who is aware of new appt date and time.

## 2021-10-31 NOTE — Telephone Encounter (Signed)
Scheduled appt per 7/3 staff msg. Pt is aware of appt date and time. Pt is aware to arrive 15 mins prior to appt time and to bring and updated insurance card. Pt is aware of appt location.

## 2021-11-01 DIAGNOSIS — J910 Malignant pleural effusion: Secondary | ICD-10-CM | POA: Diagnosis not present

## 2021-11-01 DIAGNOSIS — E43 Unspecified severe protein-calorie malnutrition: Secondary | ICD-10-CM | POA: Diagnosis not present

## 2021-11-01 DIAGNOSIS — C679 Malignant neoplasm of bladder, unspecified: Secondary | ICD-10-CM | POA: Diagnosis not present

## 2021-11-01 DIAGNOSIS — C3491 Malignant neoplasm of unspecified part of right bronchus or lung: Secondary | ICD-10-CM | POA: Diagnosis not present

## 2021-11-01 DIAGNOSIS — K5730 Diverticulosis of large intestine without perforation or abscess without bleeding: Secondary | ICD-10-CM | POA: Diagnosis not present

## 2021-11-01 DIAGNOSIS — I10 Essential (primary) hypertension: Secondary | ICD-10-CM | POA: Diagnosis not present

## 2021-11-01 DIAGNOSIS — Z9181 History of falling: Secondary | ICD-10-CM | POA: Diagnosis not present

## 2021-11-01 DIAGNOSIS — I700 Atherosclerosis of aorta: Secondary | ICD-10-CM | POA: Diagnosis not present

## 2021-11-01 DIAGNOSIS — Z791 Long term (current) use of non-steroidal anti-inflammatories (NSAID): Secondary | ICD-10-CM | POA: Diagnosis not present

## 2021-11-01 DIAGNOSIS — C3492 Malignant neoplasm of unspecified part of left bronchus or lung: Secondary | ICD-10-CM | POA: Diagnosis not present

## 2021-11-01 DIAGNOSIS — J439 Emphysema, unspecified: Secondary | ICD-10-CM | POA: Diagnosis not present

## 2021-11-01 DIAGNOSIS — M1990 Unspecified osteoarthritis, unspecified site: Secondary | ICD-10-CM | POA: Diagnosis not present

## 2021-11-01 DIAGNOSIS — E785 Hyperlipidemia, unspecified: Secondary | ICD-10-CM | POA: Diagnosis not present

## 2021-11-01 DIAGNOSIS — F17210 Nicotine dependence, cigarettes, uncomplicated: Secondary | ICD-10-CM | POA: Diagnosis not present

## 2021-11-01 DIAGNOSIS — E039 Hypothyroidism, unspecified: Secondary | ICD-10-CM | POA: Diagnosis not present

## 2021-11-01 DIAGNOSIS — M810 Age-related osteoporosis without current pathological fracture: Secondary | ICD-10-CM | POA: Diagnosis not present

## 2021-11-01 DIAGNOSIS — Z86010 Personal history of colonic polyps: Secondary | ICD-10-CM | POA: Diagnosis not present

## 2021-11-01 DIAGNOSIS — Z7982 Long term (current) use of aspirin: Secondary | ICD-10-CM | POA: Diagnosis not present

## 2021-11-03 ENCOUNTER — Inpatient Hospital Stay: Payer: PPO

## 2021-11-03 ENCOUNTER — Inpatient Hospital Stay: Payer: PPO | Attending: Internal Medicine | Admitting: Internal Medicine

## 2021-11-03 ENCOUNTER — Other Ambulatory Visit: Payer: Self-pay

## 2021-11-03 ENCOUNTER — Other Ambulatory Visit: Payer: Self-pay | Admitting: *Deleted

## 2021-11-03 VITALS — BP 110/54 | HR 110 | Temp 97.7°F | Resp 17 | Wt 116.1 lb

## 2021-11-03 DIAGNOSIS — Z91048 Other nonmedicinal substance allergy status: Secondary | ICD-10-CM | POA: Diagnosis not present

## 2021-11-03 DIAGNOSIS — R911 Solitary pulmonary nodule: Secondary | ICD-10-CM

## 2021-11-03 DIAGNOSIS — C3492 Malignant neoplasm of unspecified part of left bronchus or lung: Secondary | ICD-10-CM

## 2021-11-03 DIAGNOSIS — Z885 Allergy status to narcotic agent status: Secondary | ICD-10-CM | POA: Diagnosis not present

## 2021-11-03 DIAGNOSIS — C3490 Malignant neoplasm of unspecified part of unspecified bronchus or lung: Secondary | ICD-10-CM

## 2021-11-03 DIAGNOSIS — Z7189 Other specified counseling: Secondary | ICD-10-CM | POA: Diagnosis not present

## 2021-11-03 DIAGNOSIS — Z4682 Encounter for fitting and adjustment of non-vascular catheter: Secondary | ICD-10-CM | POA: Diagnosis not present

## 2021-11-03 DIAGNOSIS — Z515 Encounter for palliative care: Secondary | ICD-10-CM | POA: Diagnosis not present

## 2021-11-03 DIAGNOSIS — E785 Hyperlipidemia, unspecified: Secondary | ICD-10-CM | POA: Diagnosis present

## 2021-11-03 DIAGNOSIS — J948 Other specified pleural conditions: Secondary | ICD-10-CM | POA: Diagnosis present

## 2021-11-03 DIAGNOSIS — M810 Age-related osteoporosis without current pathological fracture: Secondary | ICD-10-CM | POA: Diagnosis present

## 2021-11-03 DIAGNOSIS — M545 Low back pain, unspecified: Secondary | ICD-10-CM | POA: Diagnosis not present

## 2021-11-03 DIAGNOSIS — R0602 Shortness of breath: Secondary | ICD-10-CM | POA: Diagnosis not present

## 2021-11-03 DIAGNOSIS — C679 Malignant neoplasm of bladder, unspecified: Secondary | ICD-10-CM | POA: Diagnosis not present

## 2021-11-03 DIAGNOSIS — Z5111 Encounter for antineoplastic chemotherapy: Secondary | ICD-10-CM

## 2021-11-03 DIAGNOSIS — E876 Hypokalemia: Secondary | ICD-10-CM | POA: Diagnosis not present

## 2021-11-03 DIAGNOSIS — R079 Chest pain, unspecified: Secondary | ICD-10-CM | POA: Diagnosis not present

## 2021-11-03 DIAGNOSIS — C3412 Malignant neoplasm of upper lobe, left bronchus or lung: Secondary | ICD-10-CM | POA: Diagnosis present

## 2021-11-03 DIAGNOSIS — J9601 Acute respiratory failure with hypoxia: Secondary | ICD-10-CM | POA: Diagnosis present

## 2021-11-03 DIAGNOSIS — Z8551 Personal history of malignant neoplasm of bladder: Secondary | ICD-10-CM | POA: Diagnosis not present

## 2021-11-03 DIAGNOSIS — Z9221 Personal history of antineoplastic chemotherapy: Secondary | ICD-10-CM | POA: Diagnosis not present

## 2021-11-03 DIAGNOSIS — Z87891 Personal history of nicotine dependence: Secondary | ICD-10-CM | POA: Diagnosis not present

## 2021-11-03 DIAGNOSIS — Z66 Do not resuscitate: Secondary | ICD-10-CM | POA: Diagnosis present

## 2021-11-03 DIAGNOSIS — I10 Essential (primary) hypertension: Secondary | ICD-10-CM | POA: Diagnosis present

## 2021-11-03 DIAGNOSIS — R069 Unspecified abnormalities of breathing: Secondary | ICD-10-CM | POA: Diagnosis not present

## 2021-11-03 DIAGNOSIS — J189 Pneumonia, unspecified organism: Secondary | ICD-10-CM | POA: Diagnosis present

## 2021-11-03 DIAGNOSIS — K219 Gastro-esophageal reflux disease without esophagitis: Secondary | ICD-10-CM | POA: Diagnosis present

## 2021-11-03 DIAGNOSIS — N179 Acute kidney failure, unspecified: Secondary | ICD-10-CM | POA: Diagnosis present

## 2021-11-03 DIAGNOSIS — J811 Chronic pulmonary edema: Secondary | ICD-10-CM | POA: Diagnosis not present

## 2021-11-03 DIAGNOSIS — E063 Autoimmune thyroiditis: Secondary | ICD-10-CM | POA: Diagnosis present

## 2021-11-03 DIAGNOSIS — R52 Pain, unspecified: Secondary | ICD-10-CM | POA: Diagnosis not present

## 2021-11-03 DIAGNOSIS — I471 Supraventricular tachycardia: Secondary | ICD-10-CM | POA: Diagnosis not present

## 2021-11-03 DIAGNOSIS — E43 Unspecified severe protein-calorie malnutrition: Secondary | ICD-10-CM

## 2021-11-03 DIAGNOSIS — J939 Pneumothorax, unspecified: Secondary | ICD-10-CM | POA: Diagnosis not present

## 2021-11-03 DIAGNOSIS — J90 Pleural effusion, not elsewhere classified: Secondary | ICD-10-CM | POA: Diagnosis not present

## 2021-11-03 DIAGNOSIS — J439 Emphysema, unspecified: Secondary | ICD-10-CM | POA: Diagnosis present

## 2021-11-03 DIAGNOSIS — J910 Malignant pleural effusion: Secondary | ICD-10-CM | POA: Diagnosis present

## 2021-11-03 DIAGNOSIS — Y95 Nosocomial condition: Secondary | ICD-10-CM | POA: Diagnosis present

## 2021-11-03 DIAGNOSIS — Z880 Allergy status to penicillin: Secondary | ICD-10-CM | POA: Diagnosis not present

## 2021-11-03 DIAGNOSIS — J9811 Atelectasis: Secondary | ICD-10-CM | POA: Diagnosis not present

## 2021-11-03 DIAGNOSIS — E440 Moderate protein-calorie malnutrition: Secondary | ICD-10-CM | POA: Diagnosis not present

## 2021-11-03 DIAGNOSIS — J869 Pyothorax without fistula: Secondary | ICD-10-CM | POA: Diagnosis present

## 2021-11-03 DIAGNOSIS — E039 Hypothyroidism, unspecified: Secondary | ICD-10-CM | POA: Diagnosis not present

## 2021-11-03 LAB — CBC WITH DIFFERENTIAL (CANCER CENTER ONLY)
Abs Immature Granulocytes: 0.19 10*3/uL — ABNORMAL HIGH (ref 0.00–0.07)
Basophils Absolute: 0 10*3/uL (ref 0.0–0.1)
Basophils Relative: 0 %
Eosinophils Absolute: 0.3 10*3/uL (ref 0.0–0.5)
Eosinophils Relative: 2 %
HCT: 39.1 % (ref 36.0–46.0)
Hemoglobin: 13.4 g/dL (ref 12.0–15.0)
Immature Granulocytes: 1 %
Lymphocytes Relative: 3 %
Lymphs Abs: 0.6 10*3/uL — ABNORMAL LOW (ref 0.7–4.0)
MCH: 30.8 pg (ref 26.0–34.0)
MCHC: 34.3 g/dL (ref 30.0–36.0)
MCV: 89.9 fL (ref 80.0–100.0)
Monocytes Absolute: 1 10*3/uL (ref 0.1–1.0)
Monocytes Relative: 5 %
Neutro Abs: 19.7 10*3/uL — ABNORMAL HIGH (ref 1.7–7.7)
Neutrophils Relative %: 89 %
Platelet Count: 390 10*3/uL (ref 150–400)
RBC: 4.35 MIL/uL (ref 3.87–5.11)
RDW: 14.1 % (ref 11.5–15.5)
WBC Count: 21.8 10*3/uL — ABNORMAL HIGH (ref 4.0–10.5)
nRBC: 0 % (ref 0.0–0.2)

## 2021-11-03 LAB — CYTOLOGY - NON PAP

## 2021-11-03 LAB — CMP (CANCER CENTER ONLY)
ALT: 28 U/L (ref 0–44)
AST: 21 U/L (ref 15–41)
Albumin: 3.5 g/dL (ref 3.5–5.0)
Alkaline Phosphatase: 87 U/L (ref 38–126)
Anion gap: 11 (ref 5–15)
BUN: 18 mg/dL (ref 8–23)
CO2: 27 mmol/L (ref 22–32)
Calcium: 9.4 mg/dL (ref 8.9–10.3)
Chloride: 94 mmol/L — ABNORMAL LOW (ref 98–111)
Creatinine: 0.75 mg/dL (ref 0.44–1.00)
GFR, Estimated: >60 mL/min (ref >60)
Glucose, Bld: 114 mg/dL — ABNORMAL HIGH (ref 70–99)
Potassium: 3.8 mmol/L (ref 3.5–5.1)
Sodium: 132 mmol/L — ABNORMAL LOW (ref 135–145)
Total Bilirubin: 0.6 mg/dL (ref 0.3–1.2)
Total Protein: 6.5 g/dL (ref 6.5–8.1)

## 2021-11-03 MED ORDER — HYDROCODONE-ACETAMINOPHEN 5-325 MG PO TABS: 1.0000 | ORAL_TABLET | ORAL | 0 refills | Status: AC | PRN

## 2021-11-03 NOTE — Progress Notes (Signed)
[place] Telephone:([phone]   [phone]  CONSULT NOTE  REFERRING PHYSICIAN: Dr. Leory Plowman Icard  REASON FOR CONSULTATION:  76 years old white female recently diagnosed with lung cancer.  HPI Rhonda Cook is a 76 y.o. female with past medical history significant for osteoarthritis, autoimmune thyroiditis, history of bladder cancer status post surgical excision followed by intravesical therapy, COPD, hypertension, dyslipidemia, hypothyroidism as well as osteoporosis and diverticulosis.  The patient also has a long history of smoking.  She has been complaining of cough and shortness of breath as well as lack of appetite and weight loss.  She presented to [facility] for evaluation and she had chest x-ray performed on 10/19/2021 and it showed bilateral pleural effusions with bibasilar atelectasis much greater on the left.  This was followed by CT scan of the chest, abdomen and pelvis with contrast on 10/19/2021 and it showed bilateral nodular pleural thickening and perifissural nodules.  Reference right pleural nodule measuring 0.9 cm.  Irregular juxtapleural solid nodule of the left upper lobe measuring 1.5 x 1.0 cm.  Additional subpleural nodule of the inferior left upper lobe measuring 1.7 x 1.2 cm.  There was a small right pleural effusion and loculated moderate left pleural effusion with near complete collapse of the left lower lobe.  There was also a reference precarinal lymph node measuring 1.4 cm in short axis.  There was asymmetric left paraspinous soft tissue thickening concerning for additional site of metastatic disease.  On 10/20/2021 the patient underwent ultrasound-guided left thoracentesis with drainage of 240 cc of bloody fluid.  The final cytology showed atypical cells.  She had placement of left chest tube on 10/25/2021 with drainage of the pleural effusion.  Again the final cytology was suspicious for malignancy.  The patient was seen by Dr. Valeta Harms  and on October 28, 2021 she underwent flexible video fiberoptic bronchoscopy with endobronchial ultrasound and biopsies.  The final cytology ([id number]) showed malignant cells consistent with squamous cell carcinoma. Dr. Valeta Harms kindly referred the patient to me today for evaluation and recommendation regarding treatment of her condition. When seen today she continues to complain of generalized fatigue and weakness as well as shortness of breath at baseline increased with exertion and she is currently on home oxygen.  She also has some soreness on the left side of the chest with persistent cough and no hemoptysis.  She lost around 15 pounds in the last 6 months.  She has no nausea, vomiting, diarrhea or constipation.  She has no headache or visual changes. Family history significant for mother with lung cancer.  Father had diabetes.  She had brother who also had cancer of unknown type. The patient is married and has 6 children between her and her husband.  She used to be a Lawyer.  She was accompanied today by her husband Rhonda Cook and her daughter Rhonda Cook.  The patient has a history of smoking 1 pack/day for around 60 years and she quit 1 month ago.  She has no history of alcohol or drug abuse.  HPI  Past Medical History:  Diagnosis Date   Arthritis    Autoimmune thyroiditis    followed by pcp   Bladder cancer [facility])    urologist-- dr winter---  s/p TURBT 05-31-2018; 07-23-2018   Chronic constipation    Chronically dry eyes    COPD (chronic obstructive pulmonary disease) ([place]) (09-11-2019 denies sob or difficulty breathing,  no URI in past month)   followed  by pcp--- per pt never used oxygen, has rescue inhaler and nebulizer prn   Diverticulosis of colon    History of adenomatous polyp of colon 08/ 16/ 2017   History of recurrent UTIs    Hyperlipidemia    Hypertension    followed by pcp   (09-11-2019 pt stated never had a stress test)   Hypothyroidism    Nocturia    Osteoporosis     PONV (postoperative nausea and vomiting)    slow to awaken, shakes if under too long    Past Surgical History:  Procedure Laterality Date   BRONCHIAL NEEDLE ASPIRATION BIOPSY  10/28/2021   Procedure: [place];  Surgeon: Garner Nash, DO;  Location: [place];  Service: Pulmonary;;   COLONOSCOPY N/A 12/15/2015   Procedure: COLONOSCOPY;  Surgeon: Rogene Houston, MD;  [facility];  Service: Endoscopy;  Laterality: N/A;  730 - moved to 8/16 @ 7:30   CYSTOSCOPY WITH BIOPSY N/A 07/23/2018   Procedure: CYSTOSCOPY WITH BLADDER BIOPSY;  Surgeon: Ceasar Mons, MD;  Location: [facility];  Service: Urology;  Laterality: N/A;   CYSTOSCOPY WITH BIOPSY N/A 09/16/2019   Procedure: CYSTOSCOPY WITH BLADDER BIOPSY/ FULGURATION/ AND  INSTILLATION OF GEMCITABINE IN PACU;  Surgeon: Ceasar Mons, MD;  Location: [facility];  Service: Urology;  Laterality: N/A;   FOOT SURGERY Right 2018   IR PERC PLEURAL DRAIN W/INDWELL CATH W/IMG GUIDE  10/25/2021   LAPAROSCOPIC CHOLECYSTECTOMY  2000   TRANSURETHRAL RESECTION OF BLADDER TUMOR N/A 05/31/2018   Procedure: TRANSURETHRAL RESECTION OF BLADDER TUMOR (TURBT) WITH CYSTOSCOPY, INSTILLATION OF GEMCITABINE IN PACU;  Surgeon: Ceasar Mons, MD;  Location: [facility];  Service: Urology;  Laterality: N/A;   VIDEO BRONCHOSCOPY WITH ENDOBRONCHIAL ULTRASOUND Bilateral 10/28/2021   Procedure: VIDEO BRONCHOSCOPY WITH ENDOBRONCHIAL ULTRASOUND;  Surgeon: Garner Nash, DO;  Location: [place];  Service: Pulmonary;  Laterality: Bilateral;    No family history on file.  Social History Social History   Tobacco Use   Smoking status: Former    Packs/day: 0.25    Years: 58.00    Total pack years: 14.50    Types: Cigarettes    Quit date: 09/30/2021    Years since quitting: 0.0   Smokeless tobacco: Never   Tobacco comments:    09-11-2019  per pt trying to quit,  down to  1/2 pp3d  Vaping Use   Vaping Use: Never used  Substance Use Topics   Alcohol use: No   Drug use: Never    Allergies  Allergen Reactions   Oxycodone Itching and Nausea And Vomiting   Wound Dressing Adhesive    Adhesive [Tape] Rash   Penicillins Nausea And Vomiting and Rash    Did it involve swelling of the face/tongue/throat, SOB, or low BP? no Did it involve sudden or severe rash/hives, skin peeling, or any reaction on the inside of your mouth or nose? yes Did you need to seek medical attention at a hospital or doctor's office? no When did it last happen?  20 years ago     If all above answers are "NO", may proceed with cephalosporin use.    Current Outpatient Medications  Medication Sig Dispense Refill   albuterol (PROAIR HFA) 108 (90 Base) MCG/ACT inhaler Inhale 2 puffs into the lungs every 4 (four) hours as needed for wheezing or shortness of breath.      albuterol (PROVENTIL) (2.5 MG/3ML) 0.083% nebulizer solution Take 2.5 mg  by nebulization every 6 (six) hours as needed for wheezing or shortness of breath.      aspirin EC 81 MG tablet Take 1 tablet (81 mg total) by mouth daily.     CALCIUM PO Take 1 tablet by mouth daily.     celecoxib (CELEBREX) 200 MG capsule Take 200 mg by mouth 2 (two) times daily.     cholecalciferol (VITAMIN D3) 25 MCG (1000 UNIT) tablet Take 1,000 Units by mouth daily.     docusate sodium (COLACE) 100 MG capsule Take 100 mg by mouth at bedtime.     famotidine (PEPCID) 40 MG tablet Take 1 tablet by mouth daily.     Ferrous Sulfate (IRON PO) Take 1 tablet by mouth daily.     gabapentin (NEURONTIN) 300 MG capsule Take 300 mg by mouth at bedtime.     HYDROcodone-acetaminophen (NORCO) 5-325 MG tablet Take 1 tablet by mouth every 4 (four) hours as needed for up to 5 days for moderate pain. 20 tablet 0   ibuprofen (ADVIL,MOTRIN) 200 MG tablet Take 800 mg by mouth daily as needed for headache or moderate pain.     levothyroxine (SYNTHROID, LEVOTHROID) 75 MCG  tablet Take 75 mcg by mouth daily.      lisinopril-hydrochlorothiazide (PRINZIDE,ZESTORETIC) 10-12.5 MG tablet Take 1 tablet by mouth daily.      Multiple Vitamin (MULTIVITAMIN WITH MINERALS) TABS tablet Take 1 tablet by mouth daily.     ondansetron (ZOFRAN) 4 MG tablet Take 1 tablet (4 mg total) by mouth every 6 (six) hours. As needed for nausea and vomiting 12 tablet 0   polyethylene glycol (MIRALAX / GLYCOLAX) 17 g packet Take 17 g by mouth daily as needed for mild constipation or moderate constipation. 14 each 0   Probiotic Product (PROBIOTIC-10 PO) Take 1 capsule by mouth daily.     promethazine (PHENERGAN) 12.5 MG tablet Take 12.5 mg by mouth 3 (three) times daily as needed.     simvastatin (ZOCOR) 20 MG tablet Take 20 mg by mouth at bedtime.   0   Tetrahydrozoline HCl (VISINE OP) Place 1 drop into both eyes daily as needed (dry eyes).     No current facility-administered medications for this visit.    Review of Systems  Constitutional: positive for anorexia, fatigue, and weight loss Eyes: negative Ears, nose, mouth, throat, and face: negative Respiratory: positive for cough, dyspnea on exertion, and pleurisy/chest pain Cardiovascular: negative Gastrointestinal: negative Genitourinary:negative Integument/breast: negative Hematologic/lymphatic: negative Musculoskeletal:positive for arthralgias and muscle weakness Neurological: negative Behavioral/Psych: negative Endocrine: negative Allergic/Immunologic: negative  Physical Exam  PJK:DTOIZ, healthy, no distress, ill looking, and malnourished SKIN: skin color, texture, turgor are normal, no rashes or significant lesions HEAD: Normocephalic, No masses, lesions, tenderness or abnormalities EYES: normal, PERRLA, Conjunctiva are pink and non-injected EARS: External ears normal, Canals clear OROPHARYNX:no exudate, no erythema, and lips, buccal mucosa, and tongue normal  NECK: supple, no adenopathy, no JVD LYMPH:  no palpable  lymphadenopathy, no hepatosplenomegaly BREAST:not examined LUNGS: coarse sounds heard, decreased breath sounds HEART: regular rate & rhythm, no murmurs, and no gallops ABDOMEN:abdomen soft, non-tender, normal bowel sounds, and no masses or organomegaly BACK: Back symmetric, no curvature., No CVA tenderness EXTREMITIES:no joint deformities, effusion, or inflammation, no edema  NEURO: alert & oriented x 3 with fluent speech, no focal motor/sensory deficits  PERFORMANCE STATUS: ECOG 1  LABORATORY DATA: Lab Results  Component Value Date   WBC 21.8 (H) 11/03/2021   HGB 13.4 11/03/2021   HCT 39.1 11/03/2021  MCV 89.9 11/03/2021   PLT 390 11/03/2021      Chemistry      Component Value Date/Time   NA 134 (L) 10/30/2021 0015   K 4.9 10/30/2021 0015   CL 99 10/30/2021 0015   CO2 26 10/30/2021 0015   BUN 22 10/30/2021 0015   CREATININE 0.61 10/30/2021 0015      Component Value Date/Time   CALCIUM 8.2 (L) 10/30/2021 0015   ALKPHOS 171 (H) 06/03/2018 0100   AST 43 (H) 06/03/2018 0100   ALT 47 (H) 06/03/2018 0100   BILITOT 1.0 06/03/2018 0100       RADIOGRAPHIC STUDIES: DG Chest Port 1 View  Result Date: 10/29/2021 CLINICAL DATA:  Pt c/o sob after having chest tube removed from the left side of her chest within the last hour. States she is beginning to calm down after having it removed but still feels a little sob. EXAM: PORTABLE CHEST 1 VIEW COMPARISON:  10/26/2021 and older studies.  CT, 10/19/2021. FINDINGS: Left-sided pigtail chest tube has been removed. There is a small left inferolateral hemithorax pneumothorax at the site of the prior chest tube. There is surrounding opacity at the left lung base obscuring the hemidiaphragm consistent with a combination of residual pleural fluid and atelectasis, decreased from the prior exam. No apical pneumothorax. Right lung is hyperexpanded. The opacity in the right lower lung has decreased consistent with a decrease in pleural fluid and  atelectasis. Other small areas of hazy airspace opacity persist. No new right lung abnormalities. No right pneumothorax. IMPRESSION: 1. Status post removal of the left inferior me thorax chest tube. 2. Small loculated left lateral and inferior pneumothorax. 3. Decreased opacity at the left lung base from the most recent prior exam. Residual opacity consistent with a combination of a small residual effusion and atelectasis. 4. Improved aeration in the right lung base consistent with a decrease in right pleural fluid and atelectasis. Electronically Signed   By: Lajean Manes M.D.   On: 10/29/2021 18:46   DG CHEST PORT 1 VIEW  Result Date: 10/26/2021 CLINICAL DATA:  Left pleural effusion.  Chest tube. EXAM: PORTABLE CHEST 1 VIEW COMPARISON:  Chest radiograph October 20, 2021. FINDINGS: Similar moderate left pleural effusion with left chest tube in place. No visible pneumothorax. Similar small right pleural effusion. Similar overlying bibasilar opacities. Unchanged cardiomediastinal silhouette, partially obscured. Cholecystectomy clips. IMPRESSION: 1. Similar moderate left pleural effusion with left chest tube in place. No visible pneumothorax. 2. Similar small right pleural effusion. 3. Similar overlying bibasilar atelectasis and/or consolidation. Electronically Signed   By: Margaretha Sheffield M.D.   On: 10/26/2021 08:12   IR PERC PLEURAL DRAIN W/INDWELL CATH W/IMG GUIDE  Result Date: 10/25/2021 INDICATION: 76 year old with loculated left pleural effusion. Request for chest tube placement. EXAM: PLACEMENT OF LEFT CHEST TUBE WITH ULTRASOUND AND FLUOROSCOPIC GUIDANCE MEDICATIONS: Moderate sedation ANESTHESIA/SEDATION: Moderate (conscious) sedation was employed during this procedure. A total of Versed 2.5 mg and Fentanyl 100 mcg was administered intravenously by the radiology nurse. Total intra-service moderate Sedation Time: 23 minutes. The patient's level of consciousness and vital signs were monitored continuously  by radiology nursing throughout the procedure under my direct supervision. COMPLICATIONS: None immediate. PROCEDURE: Informed written consent was obtained from the patient after a thorough discussion of the procedural risks, benefits and alternatives. All questions were addressed.A timeout was performed prior to the initiation of the procedure. Patient was rolled onto her right side. The left mid axillary region was prepped and draped in sterile  fashion. Maximal barrier sterile technique was utilized including caps, mask, sterile gowns, sterile gloves, sterile drape, hand hygiene and skin antiseptic. Ultrasound demonstrated a loculated left pleural effusion. Skin was anesthetized with 1% lidocaine. A small incision was made. Using ultrasound guidance, a Yueh catheter was directed into the loculated fluid and dark red pleural fluid was aspirated. Superstiff Amplatz wire was advanced into the pleural space. The tract was dilated to accommodate a 14 [place] multipurpose drain. Additional dark red fluid was collected for analysis. Drain was sutured to skin and attached to a chest tube drainage system. Bandage was placed. Fluoroscopic and ultrasound images were taken and saved for documentation. FINDINGS: Left pleural fluid is very loculated with multiple septations. 107 French drain was advanced into the pleural space and dark red fluid was removed. IMPRESSION: Placement of left chest tube using ultrasound and fluoroscopic guidance. Left pleural effusion is diffusely loculated. Electronically Signed   By: Markus Daft M.D.   On: 10/25/2021 17:31   US THORACENTESIS ASP PLEURAL SPACE W/IMG GUIDE  Result Date: 10/20/2021 INDICATION: Left loculated pleural effusion EXAM: ULTRASOUND GUIDED LEFT THORACENTESIS MEDICATIONS: 10 cc 1% lidocaine. COMPLICATIONS: None immediate. PROCEDURE: An ultrasound guided thoracentesis was thoroughly discussed with the patient and questions answered. The benefits, risks, alternatives and  complications were also discussed. The patient understands and wishes to proceed with the procedure. Written consent was obtained. Ultrasound was performed to localize and mark an adequate pocket of fluid in the left chest. The area was then prepped and draped in the normal sterile fashion. 1% Lidocaine was used for local anesthesia. Under ultrasound guidance a Yueh catheter was introduced. Thoracentesis was performed. The catheter was removed and a dressing applied. FINDINGS: A total of approximately 240 cc of bloody fluid was removed. Samples were sent to the laboratory as requested by the clinical team. IMPRESSION: Successful ultrasound guided left thoracentesis yielding of pleural fluid. CXR pending Read by Lavonia Drafts [facility] Electronically Signed   By: Lavonia Dana M.D.   On: 10/20/2021 12:07   DG Chest 1 View  Result Date: 10/20/2021 CLINICAL DATA:  Post left thoracentesis. EXAM: CHEST  1 VIEW COMPARISON:  10/19/2021 FINDINGS: 0923 hours. Interval decrease left pleural effusion without evidence for left-sided pneumothorax. Small right pleural effusion is similar. There is persistent bibasilar collapse/consolidation. Pleural base nodular opacities in the right lung evident, as seen on recent chest CT. Stable cardiopericardial silhouette. The visualized bony structures of the thorax are unremarkable. IMPRESSION: No evidence for pneumothorax after left thoracentesis with decrease in left pleural fluid collection. Electronically Signed   By: Misty Stanley M.D.   On: 10/20/2021 10:05   [place] CHEST (PLEURAL EFFUSION)  Result Date: 10/19/2021 CLINICAL DATA:  Pleural effusions EXAM: CHEST ULTRASOUND COMPARISON:  Chest radiograph 10/19/2021 FINDINGS: Highly loculated moderate-sized LEFT pleural effusion identified with numerous septations and scattered debris. Small uncomplicated appearing RIGHT pleural effusion. IMPRESSION: Moderate-sized LEFT pleural effusion which is highly loculated by numerous septations.  Small RIGHT simple appearing RIGHT pleural effusion. Electronically Signed   By: Lavonia Dana M.D.   On: 10/19/2021 14:59   CT ABDOMEN PELVIS W CONTRAST  Result Date: 10/19/2021 CLINICAL DATA:  Metastatic disease evaluation, abnormal chest CT, known bladder cancer * Tracking Code: BO * EXAM: CT ABDOMEN AND PELVIS WITH CONTRAST TECHNIQUE: Multidetector CT imaging of the abdomen and pelvis was performed using the standard protocol following bolus administration of intravenous contrast. RADIATION DOSE REDUCTION: This exam was performed according to the departmental dose-optimization program which includes automated  exposure control, adjustment of the mA and/or kV according to patient size and/or use of iterative reconstruction technique. CONTRAST:  69m OMNIPAQUE IOHEXOL 300 MG/ML  SOLN COMPARISON:  CT chest, 10/19/2021, CT abdomen pelvis, 05/13/2018 FINDINGS: Lower chest: Large left, moderate right pleural effusions and associated pleural thickening and nodularity. Please see prior same day CT chest. Hepatobiliary: No focal liver abnormality is seen. Status post cholecystectomy. Mild postop biliary dilatation. Pancreas: Atrophic appearance of the pancreatic tail with some adjacent fat stranding (series 2, image 27). No pancreatic ductal dilatation. Spleen: Normal in size without significant abnormality. Adrenals/Urinary Tract: Stable, definitively benign left adrenal adenomata (series 2, image 23). Kidneys are normal, without renal calculi, solid lesion, or hydronephrosis. Bladder is unremarkable. Stomach/Bowel: Stomach is within normal limits. Appendix is not clearly visualized. No evidence of bowel wall thickening, distention, or inflammatory changes. Descending and sigmoid diverticulosis. Vascular/Lymphatic: Aortic atherosclerosis. 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 No enlarged abdominal or pelvic lymph nodes. Reproductive: No mass or other significant  abnormality. Other: No abdominal wall hernia or abnormality. Small volume ascites in the low pelvis (series 2, image 70). Musculoskeletal: No acute or significant osseous findings. IMPRESSION: 1. No evidence of metastatic disease in the abdomen or pelvis. 2. Stable, definitively benign left adrenal adenomata, unchanged in comparison to examination dated 05/13/2018 and corresponding to queried paraspinous soft tissue thickening seen by prior examination of the chest. 3. Atrophic appearance of the pancreatic tail with some adjacent fat stranding, most consistent with sequelae of pancreatitis seen acutely on prior examination dated 05/13/2018. 4. 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 5. Small volume ascites in the low pelvis. 6. Please see separately reported same day examination of the chest for complete findings. Aortic Atherosclerosis (ICD10-I70.0). Electronically Signed   By: ADelanna AhmadiM.D.   On: 10/19/2021 13:46   CT Chest W Contrast  Result Date: 10/19/2021 CLINICAL DATA:  Shortness breath EXAM: CT CHEST WITH CONTRAST TECHNIQUE: Multidetector CT imaging of the chest was performed during intravenous contrast administration. RADIATION DOSE REDUCTION: This exam was performed according to the departmental dose-optimization program which includes automated exposure control, adjustment of the mA and/or kV according to patient size and/or use of iterative reconstruction technique. CONTRAST:  786mOMNIPAQUE IOHEXOL 300 MG/ML  SOLN COMPARISON:  Chest x-ray dated October 19, 2021; CT of the abdomen and pelvis dated May 03, 2018. FINDINGS: Cardiovascular: Normal heart size. No pericardial effusion. Atherosclerotic disease of the thoracic aorta. No definite coronary artery calcifications. Mediastinum/Nodes: Esophagus and thyroid are unremarkable. Enlarged mediastinal lymph nodes. Reference precarinal lymph node measuring 1.4 cm in short axis on series 2, image 84.  Lungs/Pleura: Central airways are patent. Centrilobular emphysema. Bilateral nodular pleural thickening and perifissural nodules. Reference right pleural nodule measuring 9 mm on series 2, image 44. Irregular juxtapleural solid nodule of the left upper lobe measuring 1.5 x 1.0 cm. Additional subpleural nodule of the inferior left upper lobe measuring 1.7 x 1.2 cm on series 4, image 63. Small right pleural effusion and loculated moderate left pleural effusion. Near complete collapse of the left lower lobe. Upper Abdomen: Fullness of the pancreatic tail with adjacent fluid and numerous perisplenic varices. Left adrenal gland nodule measuring up to 1.5 cm, unchanged in size when compared with prior CT. Left paraspinal soft tissue thickening, measuring up to 1.6 cm. Musculoskeletal: No chest wall abnormality. No acute or significant osseous findings. IMPRESSION: 1. Small right pleural effusion and loculated moderate left pleural effusion with associated  pleural thickening, numerous pleural and perifissural nodules and left upper lobe subpleural parenchymal nodules. Differential considerations include metastatic disease related to patient's bladder cancer versus primary lung malignancy. 2. Enlarged mediastinal lymph nodes, likely due to nodal metastatic disease. 3. Asymmetric left paraspinous soft tissue thickening, concerning for additional site of metastatic disease. Recommend contrast-enhanced CT of the abdomen and pelvis for further evaluation. 4. 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 Recommend attention on CT. 5. Aortic Atherosclerosis (ICD10-I70.0) and Emphysema (ICD10-J43.9). Electronically Signed   By: Yetta Glassman M.D.   On: 10/19/2021 12:10   DG Chest Port 1 View  Result Date: 10/19/2021 CLINICAL DATA:  Cough, shortness of breath, history COPD, hypertension, smoker, bladder  cancer EXAM: PORTABLE CHEST 1 VIEW COMPARISON:  Portable exam 0832 hours compared to 03/09/2017 FINDINGS: Normal heart size, mediastinal contours, and pulmonary vascularity. Atherosclerotic calcification aorta. New BILATERAL pleural effusions, small RIGHT, moderate LEFT. Bibasilar atelectasis greater on LEFT. No definite infiltrate or pneumothorax. Questionable nodular foci RIGHT lung. No acute osseous findings. IMPRESSION: BILATERAL pleural effusions and bibasilar atelectasis much greater on LEFT. Question nodular foci RIGHT lung. Further evaluation by CT chest with contrast recommended. Aortic Atherosclerosis (ICD10-I70.0). Electronically Signed   By: Lavonia Dana M.D.   On: 10/19/2021 08:49   DG Lumbar Spine Complete  Result Date: 10/10/2021 CLINICAL DATA:  Left low back pain EXAM: LUMBAR SPINE - COMPLETE 4 VIEW COMPARISON:  Lumbar spine radiograph dated July 08, 2020 FINDINGS: Vertebral body heights are well-maintained. Mild-to-moderate multilevel degenerative disc disease, most pronounced at L2-L3 where there is disc space height loss. Moderate facet arthropathy of the lower lumbar spine. Aortoiliac atherosclerotic calcifications. IMPRESSION: Mild-to-moderate degenerative disc disease, unchanged when compared to prior exam. Electronically Signed   By: Yetta Glassman M.D.   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   PLAN: I had a lengthy discussion with the patient and her family today about her current disease stage, prognosis and treatment options. I personally and independently reviewed the scan images and discussed results  and showed the images to the patient and her family. I recommended for the patient to complete the staging work-up by ordering a PET scan as well as MRI of the brain to rule out any other metastatic disease.  The patient has been complaining of low back pain and I am not sure if there is any osseous metastasis in that area but will wait for the PET scan for further evaluation and consideration of palliative radiotherapy to any suspicious and painful bone lesions. I explained to the patient that she has incurable condition and all the treatment will be of palliative in nature. I gave the patient the option of palliative care and hospice referral versus consideration of palliative treatment with either single agent immunotherapy if she has high PD-L1 expression or a combination of immunotherapy and chemotherapy if she has low PD-L1 expression but would also consider The patient for treatment with targeted therapy if she has any actionable mutation which is less likely for patient with squamous cell carcinoma. For the pain management, I  will give the patient refill of Norco 5/325. I will see the patient back for follow-up visit in less than 2 weeks for evaluation and discussion of her treatment options in details based on the molecular studies as well as the staging imaging studies. The patient was advised to call immediately if she has any other concerning symptoms in the interval.  The patient voices understanding of current disease status and treatment options and is in agreement with the current care plan.  All questions were answered. The patient knows to call the clinic with any problems, questions or concerns. We can certainly see the patient much sooner if necessary.  Thank you so much for allowing me to participate in the care of SHADANA PRY. I will continue to follow up the patient with you and assist in her care.  The total time spent in the appointment was 90 minutes.  Disclaimer: This  note was dictated with voice recognition software. Similar sounding words can inadvertently be transcribed and may not be corrected upon review.   Eilleen Kempf November 03, 2021, 9:03 AM

## 2021-11-03 NOTE — Patient Instructions (Signed)
Thank you for choosing [facility] to provide your care.   Should you have questions after your visit to the [facility]), please contact this office at [phone] between 8:30 AM and 4:30 PM.  Voice mails left after 4:00 PM may not be returned until the following business day.  Calls received after 4:30 PM will be answered by an off-site Nurse Triage Line.    Prescription Refills:  Please have your pharmacy contact us directly for most prescription requests.  Contact the office directly for refills of narcotics (pain medications). Allow 48-72 hours for refills.  Appointments: Please contact the [facility] scheduling department [phone] for questions regarding [facility] appointment scheduling.  Contact the schedulers with any scheduling changes so that your appointment can be rescheduled in a timely manner.   Central Scheduling for [facility] [phone] - Call to schedule procedures such as PET scans, CT scans, MRI, Ultrasound, etc.  To afford each patient quality time with our providers, please arrive 30 minutes before your scheduled appointment time.  If you arrive late for your appointment, you may be asked to reschedule.  We strive to give you quality time with our providers, and arriving late affects you and other patients whose appointments are after yours. If you are a no show for multiple scheduled visits, you may be dismissed from the clinic at the providers discretion.     Resources: [place] [employer] [phone] for additional information on assistance programs or assistance connecting with community support programs   [place]  ([phone]: Information regarding food stamps, Medicaid, and utility assistance [employer] [employer]'s shared-ride transportation service for eligible riders who have a disability that prevents them from riding the fixed route bus.   [place] [phone]  Helps people with Medicare understand their rights and benefits, navigate the Medicare system, and secure the quality healthcare they deserve [employer] [phone] Assists patients locate various types of support and financial assistance Cancer Care: 1-800-813-HOPE (470) 325-7319) Provides financial assistance, online support groups, medication/co-pay assistance.   Transportation Assistance for appointments at [facility] or provider staff for referral to [facility]  Again, thank you for choosing [facility] for your care.

## 2021-11-04 ENCOUNTER — Telehealth: Payer: Self-pay | Admitting: *Deleted

## 2021-11-04 ENCOUNTER — Telehealth: Payer: Self-pay | Admitting: Internal Medicine

## 2021-11-04 DIAGNOSIS — C3490 Malignant neoplasm of unspecified part of unspecified bronchus or lung: Secondary | ICD-10-CM | POA: Diagnosis not present

## 2021-11-04 NOTE — Telephone Encounter (Signed)
Opened in error

## 2021-11-04 NOTE — Telephone Encounter (Signed)
Scheduled per 07/06 los, patient has been called and notified.  

## 2021-11-04 NOTE — Telephone Encounter (Signed)
Verbal order from Dr.Mohamed - cancel Guardant 360 he ordered on 11/04/21 as test ordered recently by another provider. Contacted Maria in [facility] with this information. She stated she will call Guardant and cancel test.

## 2021-11-05 ENCOUNTER — Emergency Department (HOSPITAL_COMMUNITY): Payer: PPO

## 2021-11-05 ENCOUNTER — Inpatient Hospital Stay (HOSPITAL_COMMUNITY)
Admission: EM | Admit: 2021-11-05 | Discharge: 2021-11-29 | DRG: 177 | Disposition: E | Payer: PPO | Attending: Internal Medicine | Admitting: Internal Medicine

## 2021-11-05 ENCOUNTER — Other Ambulatory Visit: Payer: Self-pay

## 2021-11-05 ENCOUNTER — Encounter (HOSPITAL_COMMUNITY): Payer: Self-pay | Admitting: *Deleted

## 2021-11-05 DIAGNOSIS — Z8551 Personal history of malignant neoplasm of bladder: Secondary | ICD-10-CM

## 2021-11-05 DIAGNOSIS — J910 Malignant pleural effusion: Secondary | ICD-10-CM | POA: Diagnosis present

## 2021-11-05 DIAGNOSIS — E876 Hypokalemia: Secondary | ICD-10-CM | POA: Diagnosis not present

## 2021-11-05 DIAGNOSIS — I471 Supraventricular tachycardia: Secondary | ICD-10-CM | POA: Diagnosis not present

## 2021-11-05 DIAGNOSIS — Z91048 Other nonmedicinal substance allergy status: Secondary | ICD-10-CM | POA: Diagnosis not present

## 2021-11-05 DIAGNOSIS — Z79899 Other long term (current) drug therapy: Secondary | ICD-10-CM

## 2021-11-05 DIAGNOSIS — Y95 Nosocomial condition: Secondary | ICD-10-CM | POA: Diagnosis present

## 2021-11-05 DIAGNOSIS — C3412 Malignant neoplasm of upper lobe, left bronchus or lung: Secondary | ICD-10-CM | POA: Diagnosis present

## 2021-11-05 DIAGNOSIS — E43 Unspecified severe protein-calorie malnutrition: Secondary | ICD-10-CM | POA: Insufficient documentation

## 2021-11-05 DIAGNOSIS — N179 Acute kidney failure, unspecified: Secondary | ICD-10-CM | POA: Diagnosis present

## 2021-11-05 DIAGNOSIS — Z880 Allergy status to penicillin: Secondary | ICD-10-CM | POA: Diagnosis not present

## 2021-11-05 DIAGNOSIS — C3490 Malignant neoplasm of unspecified part of unspecified bronchus or lung: Secondary | ICD-10-CM | POA: Diagnosis not present

## 2021-11-05 DIAGNOSIS — E785 Hyperlipidemia, unspecified: Secondary | ICD-10-CM | POA: Diagnosis present

## 2021-11-05 DIAGNOSIS — Z66 Do not resuscitate: Secondary | ICD-10-CM | POA: Diagnosis present

## 2021-11-05 DIAGNOSIS — Z9221 Personal history of antineoplastic chemotherapy: Secondary | ICD-10-CM | POA: Diagnosis not present

## 2021-11-05 DIAGNOSIS — J948 Other specified pleural conditions: Secondary | ICD-10-CM | POA: Diagnosis present

## 2021-11-05 DIAGNOSIS — M810 Age-related osteoporosis without current pathological fracture: Secondary | ICD-10-CM | POA: Diagnosis present

## 2021-11-05 DIAGNOSIS — Z885 Allergy status to narcotic agent status: Secondary | ICD-10-CM | POA: Diagnosis not present

## 2021-11-05 DIAGNOSIS — Z87891 Personal history of nicotine dependence: Secondary | ICD-10-CM | POA: Diagnosis not present

## 2021-11-05 DIAGNOSIS — J189 Pneumonia, unspecified organism: Secondary | ICD-10-CM | POA: Diagnosis present

## 2021-11-05 DIAGNOSIS — J9601 Acute respiratory failure with hypoxia: Secondary | ICD-10-CM | POA: Diagnosis present

## 2021-11-05 DIAGNOSIS — K219 Gastro-esophageal reflux disease without esophagitis: Secondary | ICD-10-CM | POA: Diagnosis present

## 2021-11-05 DIAGNOSIS — Z515 Encounter for palliative care: Secondary | ICD-10-CM

## 2021-11-05 DIAGNOSIS — J90 Pleural effusion, not elsewhere classified: Secondary | ICD-10-CM | POA: Diagnosis present

## 2021-11-05 DIAGNOSIS — J439 Emphysema, unspecified: Secondary | ICD-10-CM | POA: Diagnosis present

## 2021-11-05 DIAGNOSIS — J869 Pyothorax without fistula: Secondary | ICD-10-CM | POA: Diagnosis present

## 2021-11-05 DIAGNOSIS — I10 Essential (primary) hypertension: Secondary | ICD-10-CM | POA: Diagnosis present

## 2021-11-05 DIAGNOSIS — E063 Autoimmune thyroiditis: Secondary | ICD-10-CM | POA: Diagnosis present

## 2021-11-05 DIAGNOSIS — E440 Moderate protein-calorie malnutrition: Secondary | ICD-10-CM | POA: Diagnosis present

## 2021-11-05 DIAGNOSIS — Z791 Long term (current) use of non-steroidal anti-inflammatories (NSAID): Secondary | ICD-10-CM

## 2021-11-05 DIAGNOSIS — R54 Age-related physical debility: Secondary | ICD-10-CM | POA: Diagnosis present

## 2021-11-05 DIAGNOSIS — J440 Chronic obstructive pulmonary disease with acute lower respiratory infection: Secondary | ICD-10-CM | POA: Diagnosis present

## 2021-11-05 DIAGNOSIS — E039 Hypothyroidism, unspecified: Secondary | ICD-10-CM | POA: Diagnosis present

## 2021-11-05 DIAGNOSIS — Z7189 Other specified counseling: Secondary | ICD-10-CM | POA: Diagnosis not present

## 2021-11-05 DIAGNOSIS — Z79890 Hormone replacement therapy (postmenopausal): Secondary | ICD-10-CM

## 2021-11-05 DIAGNOSIS — J9801 Acute bronchospasm: Secondary | ICD-10-CM | POA: Diagnosis present

## 2021-11-05 DIAGNOSIS — Z7982 Long term (current) use of aspirin: Secondary | ICD-10-CM

## 2021-11-05 LAB — COMPREHENSIVE METABOLIC PANEL
ALT: 20 U/L (ref 0–44)
AST: 16 U/L (ref 15–41)
Albumin: 2.7 g/dL — ABNORMAL LOW (ref 3.5–5.0)
Alkaline Phosphatase: 84 U/L (ref 38–126)
Anion gap: 13 (ref 5–15)
BUN: 27 mg/dL — ABNORMAL HIGH (ref 8–23)
CO2: 25 mmol/L (ref 22–32)
Calcium: 8.6 mg/dL — ABNORMAL LOW (ref 8.9–10.3)
Chloride: 93 mmol/L — ABNORMAL LOW (ref 98–111)
Creatinine, Ser: 1.09 mg/dL — ABNORMAL HIGH (ref 0.44–1.00)
GFR, Estimated: 53 mL/min — ABNORMAL LOW (ref >60)
Glucose, Bld: 96 mg/dL (ref 70–99)
Potassium: 3.7 mmol/L (ref 3.5–5.1)
Sodium: 131 mmol/L — ABNORMAL LOW (ref 135–145)
Total Bilirubin: 0.6 mg/dL (ref 0.3–1.2)
Total Protein: 5.8 g/dL — ABNORMAL LOW (ref 6.5–8.1)

## 2021-11-05 LAB — CBC WITH DIFFERENTIAL/PLATELET
Abs Immature Granulocytes: 0.07 10*3/uL (ref 0.00–0.07)
Basophils Absolute: 0 10*3/uL (ref 0.0–0.1)
Basophils Relative: 0 %
Eosinophils Absolute: 0.2 10*3/uL (ref 0.0–0.5)
Eosinophils Relative: 2 %
HCT: 35.4 % — ABNORMAL LOW (ref 36.0–46.0)
Hemoglobin: 11.9 g/dL — ABNORMAL LOW (ref 12.0–15.0)
Immature Granulocytes: 1 %
Lymphocytes Relative: 5 %
Lymphs Abs: 0.5 10*3/uL — ABNORMAL LOW (ref 0.7–4.0)
MCH: 31.1 pg (ref 26.0–34.0)
MCHC: 33.6 g/dL (ref 30.0–36.0)
MCV: 92.4 fL (ref 80.0–100.0)
Monocytes Absolute: 0.6 10*3/uL (ref 0.1–1.0)
Monocytes Relative: 5 %
Neutro Abs: 10.3 10*3/uL — ABNORMAL HIGH (ref 1.7–7.7)
Neutrophils Relative %: 87 %
Platelets: 284 10*3/uL (ref 150–400)
RBC: 3.83 MIL/uL — ABNORMAL LOW (ref 3.87–5.11)
RDW: 14.5 % (ref 11.5–15.5)
WBC: 11.8 10*3/uL — ABNORMAL HIGH (ref 4.0–10.5)
nRBC: 0 % (ref 0.0–0.2)

## 2021-11-05 LAB — LACTIC ACID, PLASMA
Lactic Acid, Venous: 1.4 mmol/L (ref 0.5–1.9)
Lactic Acid, Venous: 1.2 mmol/L (ref 0.5–1.9)

## 2021-11-05 LAB — BRAIN NATRIURETIC PEPTIDE: B Natriuretic Peptide: 30 pg/mL (ref 0.0–100.0)

## 2021-11-05 MED ORDER — VANCOMYCIN HCL 750 MG/150ML IV SOLN
750.0000 mg | INTRAVENOUS | Status: DC
  Administered 2021-11-06: 750 mg via INTRAVENOUS
  Filled 2021-11-05: qty 150

## 2021-11-05 MED ORDER — POLYETHYLENE GLYCOL 3350 17 G PO PACK: 17.0000 g | PACK | Freq: Every day | ORAL | Status: DC | PRN

## 2021-11-05 MED ORDER — ALBUTEROL SULFATE (2.5 MG/3ML) 0.083% IN NEBU: 2.5000 mg | INHALATION_SOLUTION | Freq: Four times a day (QID) | RESPIRATORY_TRACT | Status: DC | PRN

## 2021-11-05 MED ORDER — GABAPENTIN 300 MG PO CAPS
300.0000 mg | ORAL_CAPSULE | Freq: Every day | ORAL | Status: DC
Start: 2021-11-05 — End: 2021-11-10
  Administered 2021-11-05 – 2021-11-10 (×5): 300 mg via ORAL
  Filled 2021-11-05 (×5): qty 1

## 2021-11-05 MED ORDER — HYDROCODONE-ACETAMINOPHEN 5-325 MG PO TABS
1.0000 | ORAL_TABLET | ORAL | Status: DC | PRN
  Administered 2021-11-07 – 2021-11-08 (×2): 1 via ORAL
  Filled 2021-11-05 (×2): qty 1

## 2021-11-05 MED ORDER — TETRAHYDROZOLINE HCL 0.05 % OP SOLN: 1.0000 [drp] | Freq: Every day | OPHTHALMIC | Status: DC | PRN

## 2021-11-05 MED ORDER — HEPARIN SODIUM (PORCINE) 5000 UNIT/ML IJ SOLN
5000.0000 [IU] | Freq: Three times a day (TID) | INTRAMUSCULAR | Status: AC
  Administered 2021-11-05 – 2021-11-06 (×3): 5000 [IU] via SUBCUTANEOUS
  Filled 2021-11-05 (×3): qty 1

## 2021-11-05 MED ORDER — MORPHINE SULFATE (PF) 4 MG/ML IV SOLN
4.0000 mg | Freq: Once | INTRAVENOUS | Status: AC
  Administered 2021-11-05: 4 mg via INTRAVENOUS
  Filled 2021-11-05: qty 1

## 2021-11-05 MED ORDER — ONDANSETRON HCL 4 MG PO TABS: 4.0000 mg | ORAL_TABLET | Freq: Four times a day (QID) | ORAL | Status: DC | PRN

## 2021-11-05 MED ORDER — ONDANSETRON HCL 4 MG/2ML IJ SOLN
4.0000 mg | Freq: Four times a day (QID) | INTRAMUSCULAR | Status: DC | PRN
  Administered 2021-11-06 – 2021-11-09 (×9): 4 mg via INTRAVENOUS
  Filled 2021-11-05 (×9): qty 2

## 2021-11-05 MED ORDER — ACETAMINOPHEN 650 MG RE SUPP: 650.0000 mg | Freq: Four times a day (QID) | RECTAL | Status: DC | PRN

## 2021-11-05 MED ORDER — MORPHINE SULFATE (PF) 2 MG/ML IV SOLN
2.0000 mg | INTRAVENOUS | Status: DC | PRN
  Administered 2021-11-06 (×2): 2 mg via INTRAVENOUS
  Filled 2021-11-05 (×3): qty 1

## 2021-11-05 MED ORDER — GUAIFENESIN ER 600 MG PO TB12
600.0000 mg | ORAL_TABLET | Freq: Two times a day (BID) | ORAL | Status: DC
  Administered 2021-11-05 – 2021-11-10 (×9): 600 mg via ORAL
  Filled 2021-11-05 (×9): qty 1

## 2021-11-05 MED ORDER — FENTANYL CITRATE PF 50 MCG/ML IJ SOSY
25.0000 ug | PREFILLED_SYRINGE | Freq: Once | INTRAMUSCULAR | Status: AC
  Administered 2021-11-05: 25 ug via INTRAVENOUS
  Filled 2021-11-05: qty 1

## 2021-11-05 MED ORDER — SODIUM CHLORIDE 0.9 % IV SOLN
2.0000 g | Freq: Once | INTRAVENOUS | Status: AC
  Administered 2021-11-05: 2 g via INTRAVENOUS
  Filled 2021-11-05: qty 10

## 2021-11-05 MED ORDER — ONDANSETRON HCL 4 MG/2ML IJ SOLN
4.0000 mg | Freq: Once | INTRAMUSCULAR | Status: AC
  Administered 2021-11-05: 4 mg via INTRAVENOUS
  Filled 2021-11-05: qty 2

## 2021-11-05 MED ORDER — SODIUM CHLORIDE 0.9 % IV BOLUS
1000.0000 mL | Freq: Once | INTRAVENOUS | Status: AC
  Administered 2021-11-05: 1000 mL via INTRAVENOUS

## 2021-11-05 MED ORDER — IOHEXOL 350 MG/ML SOLN
75.0000 mL | Freq: Once | INTRAVENOUS | Status: AC | PRN
  Administered 2021-11-05: 75 mL via INTRAVENOUS

## 2021-11-05 MED ORDER — ACETAMINOPHEN 325 MG PO TABS
650.0000 mg | ORAL_TABLET | Freq: Four times a day (QID) | ORAL | Status: DC | PRN
  Administered 2021-11-08: 650 mg via ORAL
  Filled 2021-11-05: qty 2

## 2021-11-05 MED ORDER — LEVOTHYROXINE SODIUM 75 MCG PO TABS
75.0000 ug | ORAL_TABLET | Freq: Every day | ORAL | Status: DC
  Administered 2021-11-05 – 2021-11-09 (×5): 75 ug via ORAL
  Filled 2021-11-05 (×5): qty 1

## 2021-11-05 MED ORDER — FAMOTIDINE 20 MG PO TABS
40.0000 mg | ORAL_TABLET | Freq: Every day | ORAL | Status: DC
  Administered 2021-11-05 – 2021-11-09 (×5): 40 mg via ORAL
  Filled 2021-11-05 (×5): qty 2

## 2021-11-05 MED ORDER — VANCOMYCIN HCL IN DEXTROSE 1-5 GM/200ML-% IV SOLN
1000.0000 mg | Freq: Once | INTRAVENOUS | Status: AC
  Administered 2021-11-05: 1000 mg via INTRAVENOUS
  Filled 2021-11-05: qty 200

## 2021-11-05 MED ORDER — ASPIRIN 81 MG PO TBEC
81.0000 mg | DELAYED_RELEASE_TABLET | Freq: Every day | ORAL | Status: DC
  Administered 2021-11-05 – 2021-11-09 (×5): 81 mg via ORAL
  Filled 2021-11-05 (×5): qty 1

## 2021-11-05 MED ORDER — SIMVASTATIN 20 MG PO TABS
20.0000 mg | ORAL_TABLET | Freq: Every day | ORAL | Status: DC
  Administered 2021-11-05 – 2021-11-10 (×5): 20 mg via ORAL
  Filled 2021-11-05 (×5): qty 1

## 2021-11-05 MED ORDER — LIDOCAINE VISCOUS HCL 2 % MT SOLN
15.0000 mL | Freq: Once | OROMUCOSAL | Status: AC
  Administered 2021-11-05: 15 mL via OROMUCOSAL
  Filled 2021-11-05: qty 15

## 2021-11-05 MED ORDER — SODIUM CHLORIDE 0.9 % IV SOLN
1.0000 g | Freq: Three times a day (TID) | INTRAVENOUS | Status: DC
  Administered 2021-11-06: 1 g via INTRAVENOUS
  Filled 2021-11-05 (×6): qty 5

## 2021-11-05 NOTE — H&P (Signed)
History and Physical    Patient: Rhonda Cook ZTI:[id number] DOB: 1945-07-05 DOA: 10/31/2021 DOS: the patient was seen and examined on 11/06/2021 PCP: [facility]  Patient coming from: Home  Chief Complaint:  Chief Complaint  Patient presents with   Shortness of Breath   HPI: Rhonda Cook is a 76 y.o. female with medical history significant of autoimmune thyroiditis, bladder cancer, chronic constipation, COPD hyperlipidemia, high tension, hypothyroidism [place] to the ED with a chief complaint of dyspnea, nausea, pain.  Patient reports that symptoms were never resolved since her last admission.  They did start getting worse today.  She also ran out of her albuterol today.  Previous to that, albuterol was helping to control her symptoms.  She has had a cough that is nonproductive.  She denies fevers.  She has had 2 episodes of nausea and vomiting with nonbloody.  She cannot member the last time she had a normal meal, and reports every time she has any p.o. intake she throws up.  She has.  She denies chest pain palpitations.  She reports that her shortness of breath is worse with exertion certainly worse with laying flat.  BiPAP was discussed with patient and she still has increase in work of breathing, patient reports she would not want to wear BiPAP.  Marland Kitchen  She does not drink alcohol, does not use illicit drugs.  She is vaccinated for COVID.  Pending palliative care consult for goals of care discussion.  Patient could very much benefit from clarification of goals of care.  Husband reports she has a DNR at home, but patient is telling me that she would want to be resuscitated if there was a chance for her to live.  Moment is very hard to know prognosis and outcomes, so we need to have a definite CODE STATUS.  At this time she is indecisive and we will go with full code. Review of Systems: As mentioned in the history of present illness. All other systems reviewed and are  negative. Past Medical History:  Diagnosis Date   Arthritis    Autoimmune thyroiditis    followed by pcp   Bladder cancer [facility])    urologist-- dr winter---  s/p TURBT 05-31-2018; 07-23-2018   Chronic constipation    Chronically dry eyes    COPD (chronic obstructive pulmonary disease) ([place]) (09-11-2019 denies sob or difficulty breathing,  no URI in past month)   followed by pcp--- per pt never used oxygen, has rescue inhaler and nebulizer prn   Diverticulosis of colon    History of adenomatous polyp of colon 08/ 16/ 2017   History of recurrent UTIs    Hyperlipidemia    Hypertension    followed by pcp   (09-11-2019 pt stated never had a stress test)   Hypothyroidism    Nocturia    Osteoporosis    PONV (postoperative nausea and vomiting)    slow to awaken, shakes if under too long   Past Surgical History:  Procedure Laterality Date   BRONCHIAL NEEDLE ASPIRATION BIOPSY  10/28/2021   Procedure: BRONCHIAL NEEDLE ASPIRATION BIOPSIES;  Surgeon: Garner Nash, DO;  Location: [facility];  Service: Pulmonary;;   COLONOSCOPY N/A 12/15/2015   Procedure: COLONOSCOPY;  Surgeon: Rogene Houston, MD;  [facility];  Service: Endoscopy;  Laterality: N/A;  730 - moved to 8/16 @ 7:30   CYSTOSCOPY WITH BIOPSY N/A 07/23/2018   Procedure: CYSTOSCOPY WITH BLADDER BIOPSY;  Surgeon: Ceasar Mons, MD;  Location: [facility];  Service: Urology;  Laterality: N/A;   CYSTOSCOPY WITH BIOPSY N/A 09/16/2019   Procedure: CYSTOSCOPY WITH BLADDER BIOPSY/ FULGURATION/ AND  INSTILLATION OF GEMCITABINE IN PACU;  Surgeon: Ceasar Mons, MD;  Location[facility];  Service: Urology;  Laterality: N/A;   FOOT SURGERY Right 2018   IR PERC PLEURAL DRAIN W/INDWELL CATH W/IMG GUIDE  10/25/2021   LAPAROSCOPIC CHOLECYSTECTOMY  2000   TRANSURETHRAL RESECTION OF BLADDER TUMOR N/A 05/31/2018   Procedure: TRANSURETHRAL RESECTION OF BLADDER TUMOR (TURBT) WITH CYSTOSCOPY, INSTILLATION OF  GEMCITABINE IN PACU;  Surgeon: Ceasar Mons, MD;  Location: [facility];  Service: Urology;  Laterality: N/A;   VIDEO BRONCHOSCOPY WITH ENDOBRONCHIAL ULTRASOUND Bilateral 10/28/2021   Procedure: VIDEO BRONCHOSCOPY WITH ENDOBRONCHIAL ULTRASOUND;  Surgeon: Garner Nash, DO;  Location: [facility];  Service: Pulmonary;  Laterality: Bilateral;   Social History:  reports that she quit smoking about 5 weeks ago. Her smoking use included cigarettes. She has a 14.50 pack-year smoking history. She has never used smokeless tobacco. She reports that she does not drink alcohol and does not use drugs.  Allergies  Allergen Reactions   Oxycodone Itching and Nausea And Vomiting   Wound Dressing Adhesive    Adhesive [Tape] Rash   Penicillins Nausea And Vomiting and Rash    Did it involve swelling of the face/tongue/throat, SOB, or low BP? no Did it involve sudden or severe rash/hives, skin peeling, or any reaction on the inside of your mouth or nose? yes Did you need to seek medical attention at a hospital or doctor's office? no When did it last happen?  20 years ago     If all above answers are "NO", may proceed with cephalosporin use.    History reviewed. No pertinent family history.  Prior to Admission medications   Medication Sig Start Date End Date Taking? Authorizing Provider  albuterol (PROAIR HFA) 108 (90 Base) MCG/ACT inhaler Inhale 2 puffs into the lungs every 4 (four) hours as needed for wheezing or shortness of breath.  03/26/17   [provider]  albuterol (PROVENTIL) (2.5 MG/3ML) 0.083% nebulizer solution Take 2.5 mg by nebulization every 6 (six) hours as needed for wheezing or shortness of breath.  03/09/17   [provider]  aspirin EC 81 MG tablet Take 1 tablet (81 mg total) by mouth daily. 12/23/15   Rogene Houston, MD  CALCIUM PO Take 1 tablet by mouth daily.    [provider]  celecoxib (CELEBREX) 200 MG capsule Take 200 mg by  mouth 2 (two) times daily. 09/20/21   [provider]  cholecalciferol (VITAMIN D3) 25 MCG (1000 UNIT) tablet Take 1,000 Units by mouth daily.    [provider]  docusate sodium (COLACE) 100 MG capsule Take 100 mg by mouth at bedtime.    [provider]  famotidine (PEPCID) 40 MG tablet Take 1 tablet by mouth daily. 09/20/21   [provider]  Ferrous Sulfate (IRON PO) Take 1 tablet by mouth daily.    [provider]  gabapentin (NEURONTIN) 300 MG capsule Take 300 mg by mouth at bedtime. 09/20/21   [provider]  HYDROcodone-acetaminophen (NORCO) 5-325 MG tablet Take 1 tablet by mouth every 4 (four) hours as needed for up to 5 days for moderate pain. 11/03/21 11/08/21  Curt Bears, MD  ibuprofen (ADVIL,MOTRIN) 200 MG tablet Take 800 mg by mouth daily as needed for headache or moderate pain.    [provider]  levothyroxine (SYNTHROID, LEVOTHROID) 75 MCG tablet Take 75 mcg by mouth daily.  04/18/18   [provider]  lisinopril-hydrochlorothiazide (PRINZIDE,ZESTORETIC) 10-12.5 MG tablet Take 1 tablet by mouth daily.  04/06/18   [provider]  Multiple Vitamin (MULTIVITAMIN WITH MINERALS) TABS tablet Take 1 tablet by mouth daily. 10/31/21   Pokhrel, Corrie Mckusick, MD  ondansetron (ZOFRAN) 4 MG tablet Take 1 tablet (4 mg total) by mouth every 6 (six) hours. As needed for nausea and vomiting 10/10/21   Triplett, Tammy, PA-C  polyethylene glycol (MIRALAX / GLYCOLAX) 17 g packet Take 17 g by mouth daily as needed for mild constipation or moderate constipation. 10/30/21   Pokhrel, Corrie Mckusick, MD  Probiotic Product (PROBIOTIC-10 PO) Take 1 capsule by mouth daily.    [provider]  promethazine (PHENERGAN) 12.5 MG tablet Take 12.5 mg by mouth 3 (three) times daily as needed. 10/11/21   [provider]  simvastatin (ZOCOR) 20 MG tablet Take 20 mg by mouth at bedtime.  11/29/15   [provider]  Tetrahydrozoline HCl  (VISINE OP) Place 1 drop into both eyes daily as needed (dry eyes).    [provider]    Physical Exam: Vitals:   11/24/2021 1700 11/04/2021 1800 11/19/2021 1915 11/26/2021 1930  BP: 109/66 139/65  (!) 144/74  Pulse: 91 95 97 97  Resp: (!) 24 19 16  (!) 23  Temp:      TempSrc:      SpO2: 93% 98% 96%   Weight:      Height:       1.  General: Patient lying supine in bed, chronically ill-appearing  2. Psychiatric: Alert and oriented x 3, mood is irritable and behavior is normal for situation, pleasant and cooperative with exam   3. Neurologic: Speech and language are normal, face is symmetric, moves all 4 extremities voluntarily, at baseline without acute deficits on limited exam   4. HEENMT:  Head is atraumatic, normocephalic, pupils reactive to light, neck is supple, trachea is midline, mucous membranes are moist   5. Respiratory : Lungs are clear diminished throughout, without wheezing, rhonchi, rales.  Increased work of breathing.  Maintaining oxygen sats on 2 L nasal cannula   6. Cardiovascular : Heart rate normal, rhythm is regular, murmur present, rubs or gallops, no peripheral pitting edema, peripheral pulses palpated   7. Gastrointestinal:  Abdomen is soft, nondistended, nontender to palpation bowel sounds active, no masses or organomegaly palpated   8. Skin:  Skin is warm, dry and intact without rashes, acute lesions, or ulcers on limited exam   9.Musculoskeletal:  No acute deformities or trauma, no asymmetry in tone, right leg edematous compared to left but nonpitting, peripheral pulses palpated, no tenderness to palpation in the extremities  Data Reviewed: In the ED Heart rate 89-1 05, respiratory 24, blood pressure 105/60-144/74, satting at 96% Cytosis 11.8 Very mildly elevated creatinine 1.09 Albumin 2.7 BNP 30 Lactic acid 1.4, 1.2 CTA chest shows no PE, moderate left loculated with air-fluid levels indicative of empyema, stable left paraspinous soft  tissues thickening worrisome for mets Patient was given vancomycin, aztreonam, morphine, Zofran, 2 L bolus  Assessment and Plan: * Acute respiratory failure with hypoxia (HCC) - Requiring 2 L nasal cannula still increased work of breathing even though maintaining oxygen sats -Patient reports that she would not want to go on BiPAP -admitting to telemetry floor -CTA chest shows no PE but does show moderate left pleural effusion with findings concerning for empyema -  Thoracentesis ordered for tomorrow -BNP 30.0 -Chest x-ray shows loculated left pleural effusion -Patient with penicillin allergy, started on Vanco and aztreonam in the ED->continue -covering for HCAP given that patient was just discharged from hospital 6 days ago -The degree of bronchospasm is also likely contributing considering the patient was having symptomatic control with albuterol at home until she ran out of albuterol -Continue albuterol as needed -Continue mucolytics and flutter valve -Continue to monitor   GERD (gastroesophageal reflux disease) Continue Pepcid  Hypothyroidism - Continue Synthroid  HLD (hyperlipidemia) - Continue statin  HTN (hypertension) - On hydrochlorothiazide in setting of soft pressures in the ED and mild AKI  HCAP (healthcare-associated pneumonia) - Recently discharged from hospital 6 days ago -Possible " superinfection" noted on chest x-ray -Penicillin allergy, continue Vanco and aztreonam -with pharmacy to investigate if patient can tolerate cephalosporins -Leukocytosis has improved from 21.8>> 11.8 since last admission -Sputum culture -Blood cultures -Legionella and strep antigens -2 L bolus given in the ED -Continue to monitor  AKI (acute kidney injury) ([place]) - Mild AKI with an increase in creatinine from 0.75-1.09 -Holding lisinopril and hydrochlorothiazide for now, likely to be able to restart those in the next 24 hours  Protein-calorie malnutrition, moderate (HCC) - Albumin  2.7 -Patient will be n.p.o. after midnight for possible procedure in the a.m., but encourage nutrient dense p.o. intake  Pleural effusion - Findings concerning for empyema -Uses in the a.m.      Advance Care Planning:   Code Status: Full Code   Consults: Palliative  Family Communication: Husband at bedside  Severity of Illness: The appropriate patient status for this patient is INPATIENT. Inpatient status is judged to be reasonable and necessary in order to provide the required intensity of service to ensure the patient's safety. The patient's presenting symptoms, physical exam findings, and initial radiographic and laboratory data in the context of their chronic comorbidities is felt to place them at high risk for further clinical deterioration. Furthermore, it is not anticipated that the patient will be medically stable for discharge from the hospital within 2 midnights of admission.   * I certify that at the point of admission it is my clinical judgment that the patient will require inpatient hospital care spanning beyond 2 midnights from the point of admission due to high intensity of service, high risk for further deterioration and high frequency of surveillance required.*  Author: Rolla Plate, DO 11/03/2021 9:29 PM  For on call review [email or web address].

## 2021-11-05 NOTE — Assessment & Plan Note (Signed)
-   resuming lisinopril 10 mg daily

## 2021-11-05 NOTE — Assessment & Plan Note (Signed)
-   pulse ox down to 87% in ED and placed on supplemental [place] - Requiring 2 L nasal cannula still increased work of breathing even though maintaining oxygen sats -Patient reports that she would not want to go on BiPAP -admitting to telemetry floor -CTA chest shows no PE but does show moderate left pleural effusion with findings concerning for empyema -Thoracentesis ordered  - consult to pulmonologist as patient may require CTS referral  -BNP 30.0 -Chest x-ray shows loculated left pleural effusion -Patient with penicillin allergy, started on Vanco and aztreonam in the ED->continue -covering for HCAP given that patient was just discharged from hospital  -The degree of bronchospasm is also likely contributing considering the patient was having symptomatic control with albuterol at home until she ran out of albuterol -Continue albuterol as needed -Continue mucolytics and flutter valve -Continue to monitor - morphine for pain and SOB symptoms

## 2021-11-05 NOTE — Progress Notes (Signed)
Pharmacy Antibiotic Note  Rhonda Cook is a 76 y.o. female admitted on 11/12/2021 with pneumonia.  Pharmacy has been consulted for vancomycin and aztreonam dosing.  Plan: Vancomycin 1000 mg IV x 1 dose Vancomycin 750 mg IV every 24 hours. Cefepime 2000 mg IV every 12 hours. Monitor labs, c/s, and vanco level as indicated.  Height: 5\' 3"  (160 cm) Weight: 52.6 kg (116 lb) IBW/kg (Calculated) : 52.4  Temp (24hrs), Avg:97.8 F (36.6 C), Min:97.8 F (36.6 C), Max:97.8 F (36.6 C)  Recent Labs  Lab 10/30/21 0015 11/03/21 0839 11/26/2021 1607 11/03/2021 1806  WBC 13.9* 21.8* 11.8*  --   CREATININE 0.61 0.75 1.09*  --   LATICACIDVEN  --   --  1.4 1.2    Estimated Creatinine Clearance: 36.9 mL/min (A) (by C-G formula based on SCr of 1.09 mg/dL (H)).    Allergies  Allergen Reactions   Oxycodone Itching and Nausea And Vomiting   Wound Dressing Adhesive    Adhesive [Tape] Rash   Penicillins Nausea And Vomiting and Rash    Did it involve swelling of the face/tongue/throat, SOB, or low BP? no Did it involve sudden or severe rash/hives, skin peeling, or any reaction on the inside of your mouth or nose? yes Did you need to seek medical attention at a hospital or doctor's office? no When did it last happen?  20 years ago     If all above answers are "NO", may proceed with cephalosporin use.    Antimicrobials this admission: Cefepime 7/9 >> Vanco 7/8 >> Aztreonam 7/8     Microbiology results: 7/8 MRSA PCR: neg  Thank you for allowing pharmacy to be a part of this patient's care.  Ramond Craver 11/07/2021 9:08 PM

## 2021-11-05 NOTE — Assessment & Plan Note (Signed)
Continue Pepcid  

## 2021-11-05 NOTE — Assessment & Plan Note (Signed)
Continue statin. 

## 2021-11-05 NOTE — ED Triage Notes (Signed)
Pt brought in by [facility] for c/o sob and pain and vomiting  Pt has lung and bladder cancer   When ems arrived pt O2 sats at 94%  Pt told ems she has not taken her pain medication due to not being able to keep anything down

## 2021-11-05 NOTE — Assessment & Plan Note (Signed)
-   Findings concerning for empyema -Uses in the a.m.

## 2021-11-05 NOTE — ED Notes (Signed)
Placed pt on 2l [place] after morphine admin due to spo2 dropping to 87%

## 2021-11-05 NOTE — Assessment & Plan Note (Signed)
Continue Synthroid °

## 2021-11-05 NOTE — ED Provider Notes (Signed)
[facility] EMERGENCY DEPARTMENT Provider Note   CSN: [id number] Arrival date & time: 10/29/2021  1430     History  Chief Complaint  Patient presents with   Shortness of Breath    Rhonda Cook is a 76 y.o. female.  HPI Patient presents with her husband who assists with history.  Patient presents due to difficulty swallowing pills, eating, as well as worsening weakness generally and ongoing pain in multiple areas.  History is notable for pain that has been present for some time, including primarily left side, both hips, low back.  She was recently diagnosed with lung cancer, had a bronchoscopy performed 1 week ago.  She notes that since that time she has been unable to eat or take pills as she was previously.  She is scheduled for PET scan later this week has not started any therapy for her cancer. No fever, confusion, new focal weakness.    Home Medications Prior to Admission medications   Medication Sig Start Date End Date Taking? Authorizing Provider  albuterol (PROAIR HFA) 108 (90 Base) MCG/ACT inhaler Inhale 2 puffs into the lungs every 4 (four) hours as needed for wheezing or shortness of breath.  03/26/17   [provider]  albuterol (PROVENTIL) (2.5 MG/3ML) 0.083% nebulizer solution Take 2.5 mg by nebulization every 6 (six) hours as needed for wheezing or shortness of breath.  03/09/17   [provider]  aspirin EC 81 MG tablet Take 1 tablet (81 mg total) by mouth daily. 12/23/15   Rogene Houston, MD  CALCIUM PO Take 1 tablet by mouth daily.    [provider]  celecoxib (CELEBREX) 200 MG capsule Take 200 mg by mouth 2 (two) times daily. 09/20/21   [provider]  cholecalciferol (VITAMIN D3) 25 MCG (1000 UNIT) tablet Take 1,000 Units by mouth daily.    [provider]  docusate sodium (COLACE) 100 MG capsule Take 100 mg by mouth at bedtime.    [provider]  famotidine (PEPCID) 40 MG tablet Take 1 tablet by mouth daily.  09/20/21   [provider]  Ferrous Sulfate (IRON PO) Take 1 tablet by mouth daily.    [provider]  gabapentin (NEURONTIN) 300 MG capsule Take 300 mg by mouth at bedtime. 09/20/21   [provider]  HYDROcodone-acetaminophen (NORCO) 5-325 MG tablet Take 1 tablet by mouth every 4 (four) hours as needed for up to 5 days for moderate pain. 11/03/21 11/08/21  Curt Bears, MD  ibuprofen (ADVIL,MOTRIN) 200 MG tablet Take 800 mg by mouth daily as needed for headache or moderate pain.    [provider]  levothyroxine (SYNTHROID, LEVOTHROID) 75 MCG tablet Take 75 mcg by mouth daily.  04/18/18   [provider]  lisinopril-hydrochlorothiazide (PRINZIDE,ZESTORETIC) 10-12.5 MG tablet Take 1 tablet by mouth daily.  04/06/18   [provider]  Multiple Vitamin (MULTIVITAMIN WITH MINERALS) TABS tablet Take 1 tablet by mouth daily. 10/31/21   Pokhrel, Corrie Mckusick, MD  ondansetron (ZOFRAN) 4 MG tablet Take 1 tablet (4 mg total) by mouth every 6 (six) hours. As needed for nausea and vomiting 10/10/21   Triplett, Tammy, PA-C  polyethylene glycol (MIRALAX / GLYCOLAX) 17 g packet Take 17 g by mouth daily as needed for mild constipation or moderate constipation. 10/30/21   Pokhrel, Corrie Mckusick, MD  Probiotic Product (PROBIOTIC-10 PO) Take 1 capsule by mouth daily.    [provider]  promethazine (PHENERGAN) 12.5 MG tablet Take 12.5 mg by mouth 3 (  three) times daily as needed. 10/11/21   [provider]  simvastatin (ZOCOR) 20 MG tablet Take 20 mg by mouth at bedtime.  11/29/15   [provider]  Tetrahydrozoline HCl (VISINE OP) Place 1 drop into both eyes daily as needed (dry eyes).    [provider]      Allergies    Oxycodone, Wound dressing adhesive, Adhesive [tape], and Penicillins    Review of Systems   Review of Systems  All other systems reviewed and are negative.   Physical Exam Updated Vital Signs BP (!) 144/74   Pulse 97    Temp 97.8 F (36.6 C) (Oral)   Resp (!) 23   Ht 5\' 3"  (1.6 m)   Wt 52.6 kg   SpO2 96%   BMI 20.55 kg/m  Physical Exam Vitals and nursing note reviewed.  Constitutional:      General: She is not in acute distress.    Appearance: She is ill-appearing. She is not toxic-appearing or diaphoretic.  HENT:     Head: Normocephalic and atraumatic.  Eyes:     Conjunctiva/sclera: Conjunctivae normal.  Cardiovascular:     Rate and Rhythm: Normal rate and regular rhythm.  Pulmonary:     Effort: Pulmonary effort is normal. No respiratory distress.     Breath sounds: Normal breath sounds. No stridor.  Abdominal:     General: There is no distension.  Musculoskeletal:     Comments: No gross deformities and the patient does move all extremity spontaneously and to command.  Skin:    General: Skin is warm and dry.     Comments: Multiple small areas of ecchymosis scattered scattered  Neurological:     Mental Status: She is alert and oriented to person, place, and time.     Cranial Nerves: No cranial nerve deficit.  Psychiatric:        Mood and Affect: Mood normal.     ED Results / Procedures / Treatments   Labs (all labs ordered are listed, but only abnormal results are displayed) Labs Reviewed  COMPREHENSIVE METABOLIC PANEL - Abnormal; Notable for the following components:      Result Value   Sodium 131 (*)    Chloride 93 (*)    BUN 27 (*)    Creatinine, Ser 1.09 (*)    Calcium 8.6 (*)    Total Protein 5.8 (*)    Albumin 2.7 (*)    GFR, Estimated 53 (*)    All other components within normal limits  CBC WITH DIFFERENTIAL/PLATELET - Abnormal; Notable for the following components:   WBC 11.8 (*)    RBC 3.83 (*)    Hemoglobin 11.9 (*)    HCT 35.4 (*)    Neutro Abs 10.3 (*)    Lymphs Abs 0.5 (*)    All other components within normal limits  LACTIC ACID, PLASMA  LACTIC ACID, PLASMA  BRAIN NATRIURETIC PEPTIDE    EKG None  Radiology CT Angio Chest PE W/Cm &/Or Wo Cm  Result  Date: 11/04/2021 CLINICAL DATA:  High probability for pulmonary embolism. Shortness of breath with pain and vomiting. History of bladder cancer. EXAM: CT ANGIOGRAPHY CHEST WITH CONTRAST TECHNIQUE: Multidetector CT imaging of the chest was performed using the standard protocol during bolus administration of intravenous contrast. Multiplanar CT image reconstructions and MIPs were obtained to evaluate the vascular anatomy. RADIATION DOSE REDUCTION: This exam was performed according to the departmental dose-optimization program which includes automated exposure control, adjustment of the mA and/or  kV according to patient size and/or use of iterative reconstruction technique. CONTRAST:  42mL OMNIPAQUE IOHEXOL 350 MG/ML SOLN COMPARISON:  CT chest 05/21/2021 FINDINGS: Cardiovascular: Satisfactory opacification of the pulmonary arteries to the segmental level. No evidence of pulmonary embolism. Normal heart size. No pericardial effusion. There are atherosclerotic calcifications of the aorta. Mediastinum/Nodes: There is an enlarged precarinal lymph node measuring 11 mm short axis. No other enlarged lymph nodes are identified. Thyroid gland is not visualized on this study. Esophagus is nondilated. Lungs/Pleura: Moderate size loculated left pleural effusion is again seen similar in size, there are new air-fluid levels throughout this effusion. There is diffuse left pleural thickening which appears similar to prior. Small right pleural effusion appears similar to the prior study, mildly loculated. Emphysematous changes are again seen. There is compressive atelectasis of the bilateral lower lobes similar to the prior study. Spiculated nodular density in the left lung apex measuring 1.4 by 1.0 cm image 6/25 appears unchanged from the prior study. Additional anterior left upper lobe nodule measuring 13 mm appears slightly smaller. Pleural-based and fissural based nodules throughout the right lung are unchanged. Trachea and central  airways are patent. Additional smaller nodular densities in the left upper lobe measuring up to 6 mm appear unchanged. Upper Abdomen: Nodularity of the left adrenal unchanged. Musculoskeletal: There is left paraspinal muscular thickening measuring up to 2.6 cm in thickness axial image 4/113. This is incompletely imaged and appears similar to prior. No acute fractures are seen. Review of the MIP images confirms the above findings. IMPRESSION: 1. No pulmonary embolism identified. 2. Moderate-sized loculated left pleural effusion now contains multiple air-fluid levels. Findings are concerning for empyema. 3. Stable loculated right pleural effusion. 4. Single left upper lobe nodule has mildly decreased in size. Nodular densities are otherwise stable in both lungs with dominant spiculated nodule in the left lung apex. 5. Stable left paraspinous soft tissue thickening worrisome for metastatic disease. 6. Stable enlarged precarinal lymph node. Electronically Signed   By: Ronney Asters M.D.   On: 11/09/2021 19:16   DG Chest 2 View  Result Date: 11/07/2021 CLINICAL DATA:  Back and hip pain. EXAM: CHEST - 2 VIEW COMPARISON:  10/29/2021 FINDINGS: The cardiopericardial silhouette is within normal limits for size. Chronic left effusion noted with multiple air-fluid levels. Small right pleural effusion evident. Interstitial markings are diffusely coarsened with chronic features. Bones are diffusely demineralized. IMPRESSION: 1. Chronic left effusion with multiple air-fluid levels. Patient had a left chest tube previously. Superinfection of the loculated left pleural fluid cannot be excluded. 2. Small right pleural effusion. Electronically Signed   By: Misty Stanley M.D.   On: 11/23/2021 17:02   DG Lumbar Spine 2-3 Views  Result Date: 11/12/2021 CLINICAL DATA:  Back pain. EXAM: LUMBAR SPINE - 2-3 VIEW COMPARISON:  None Available. FINDINGS: No evidence for an acute fracture. No subluxation. Loss of disc height noted L2-3 and  L3-4. No worrisome lytic or sclerotic osseous abnormality. IMPRESSION: Degenerative changes without acute bony findings. Electronically Signed   By: Misty Stanley M.D.   On: 11/08/2021 17:00    Procedures Procedures    Medications Ordered in ED Medications  aztreonam (AZACTAM) 2 g in sodium chloride 0.9 % 100 mL IVPB (has no administration in time range)  ondansetron (ZOFRAN) injection 4 mg (has no administration in time range)  sodium chloride 0.9 % bolus 1,000 mL (0 mLs Intravenous Stopped 11/03/2021 1818)  ondansetron (ZOFRAN) injection 4 mg (4 mg Intravenous Given 11/12/2021 1603)  fentaNYL (SUBLIMAZE)  injection 25 mcg (25 mcg Intravenous Given 11/09/2021 1603)  lidocaine (XYLOCAINE) 2 % viscous mouth solution 15 mL (15 mLs Mouth/Throat Given 11/10/2021 1603)  morphine (PF) 4 MG/ML injection 4 mg (4 mg Intravenous Given 11/22/2021 1650)  sodium chloride 0.9 % bolus 1,000 mL (1,000 mLs Intravenous New Bag/Given 11/15/2021 1818)  morphine (PF) 4 MG/ML injection 4 mg (4 mg Intravenous Given 11/04/2021 1830)  iohexol (OMNIPAQUE) 350 MG/ML injection 75 mL (75 mLs Intravenous Contrast Given 11/20/2021 1842)    ED Course/ Medical Decision Making/ A&P This patient with a Hx of recently diagnosed lung cancer, hypertension, presents to the ED for concern of pain in multiple areas, generalized weakness and difficulty with eating and taking medication, this involves an extensive number of treatment options, and is a complaint that carries with it a high risk of complications and morbidity.    The differential diagnosis includes dehydration, electrolyte abnormalities, metastatic progression of disease, pneumonia   Social Determinants of Health:  Age, cancer  Additional history obtained:  Additional history and/or information obtained from husband and chart review, notable for husband for HPI above, chart review for bronchoscopy details reviewed, no noted complications   After the initial evaluation, orders, including:  X-rays labs fluids IV narcotics, analgesics were initiated.   Patient placed on Cardiac and Pulse-Oximetry Monitors. The patient was maintained on a cardiac monitor.  The cardiac monitored showed an rhythm of 95 sinus normal The patient was also maintained on pulse oximetry. The readings were typically 100% room air normal   On repeat evaluation of the patient stayed the same  Lab Tests:  I personally interpreted labs.  The pertinent results include: Worsening renal function, leukocytosis  Imaging Studies ordered:  I independently visualized and interpreted imaging which showed empyema, left-sided, multiple lesions consistent with lung cancer I agree with the radiologist interpretation  Consultations Obtained:  I requested consultation with the internal,  and discussed lab and imaging findings as well as pertinent plan - they recommend: Admission  Dispostion / Final MDM:  After consideration of the diagnostic results and the patient's response to treatment, this adult female with recently diagnosed lung cancer, without initiation of therapy thus far presents with dyspnea, fatigue, was found to have evidence for empyema, left-sided, and new oxygen requirement.  With concern for her increased respiratory effort additional considerations including ACS, PE were considered, though these were not likely given today's findings.  Patient required initiation of broad-spectrum antibiotics, admission to hospital given her new oxygen requirement.  Final Clinical Impression(s) / ED Diagnoses Final diagnoses:  Empyema ([place])  HCAP (healthcare-associated pneumonia)     Carmin Muskrat, MD 11/14/2021 2032

## 2021-11-05 NOTE — Assessment & Plan Note (Signed)
-   Albumin 2.7 -Patient will be n.p.o. after midnight for possible procedure in the a.m., but encourage nutrient dense p.o. intake

## 2021-11-05 NOTE — Assessment & Plan Note (Addendum)
-   Recently discharged from hospital 6 days prior to this admission -Possible " superinfection" noted on chest x-ray -Penicillin allergy, continue Vanco and aztreonam -with pharmacy to investigate if patient can tolerate cephalosporins -Leukocytosis has improved from 21.8>> 11.8 since last admission -Sputum culture -Blood cultures -Legionella and strep antigens -2 L bolus given in the ED -Continue to monitor

## 2021-11-05 NOTE — Assessment & Plan Note (Signed)
-   start lisinopril as renal function back to normal

## 2021-11-06 DIAGNOSIS — J869 Pyothorax without fistula: Secondary | ICD-10-CM

## 2021-11-06 DIAGNOSIS — Z66 Do not resuscitate: Secondary | ICD-10-CM | POA: Diagnosis present

## 2021-11-06 DIAGNOSIS — E876 Hypokalemia: Secondary | ICD-10-CM | POA: Diagnosis not present

## 2021-11-06 DIAGNOSIS — J9601 Acute respiratory failure with hypoxia: Secondary | ICD-10-CM | POA: Diagnosis not present

## 2021-11-06 DIAGNOSIS — N179 Acute kidney failure, unspecified: Secondary | ICD-10-CM | POA: Diagnosis not present

## 2021-11-06 LAB — MAGNESIUM: Magnesium: 1.9 mg/dL (ref 1.7–2.4)

## 2021-11-06 LAB — COMPREHENSIVE METABOLIC PANEL
ALT: 18 U/L (ref 0–44)
AST: 13 U/L — ABNORMAL LOW (ref 15–41)
Albumin: 2.3 g/dL — ABNORMAL LOW (ref 3.5–5.0)
Alkaline Phosphatase: 72 U/L (ref 38–126)
Anion gap: 11 (ref 5–15)
BUN: 19 mg/dL (ref 8–23)
CO2: 22 mmol/L (ref 22–32)
Calcium: 8.1 mg/dL — ABNORMAL LOW (ref 8.9–10.3)
Chloride: 99 mmol/L (ref 98–111)
Creatinine, Ser: 0.64 mg/dL (ref 0.44–1.00)
GFR, Estimated: >60 mL/min (ref >60)
Glucose, Bld: 93 mg/dL (ref 70–99)
Potassium: 3.2 mmol/L — ABNORMAL LOW (ref 3.5–5.1)
Sodium: 132 mmol/L — ABNORMAL LOW (ref 135–145)
Total Bilirubin: 0.7 mg/dL (ref 0.3–1.2)
Total Protein: 5.2 g/dL — ABNORMAL LOW (ref 6.5–8.1)

## 2021-11-06 LAB — CBC WITH DIFFERENTIAL/PLATELET
Abs Immature Granulocytes: 0.08 10*3/uL — ABNORMAL HIGH (ref 0.00–0.07)
Basophils Absolute: 0 10*3/uL (ref 0.0–0.1)
Basophils Relative: 0 %
Eosinophils Absolute: 0.1 10*3/uL (ref 0.0–0.5)
Eosinophils Relative: 1 %
HCT: 33.1 % — ABNORMAL LOW (ref 36.0–46.0)
Hemoglobin: 11.1 g/dL — ABNORMAL LOW (ref 12.0–15.0)
Immature Granulocytes: 1 %
Lymphocytes Relative: 2 %
Lymphs Abs: 0.3 10*3/uL — ABNORMAL LOW (ref 0.7–4.0)
MCH: 31.2 pg (ref 26.0–34.0)
MCHC: 33.5 g/dL (ref 30.0–36.0)
MCV: 93 fL (ref 80.0–100.0)
Monocytes Absolute: 0.3 10*3/uL (ref 0.1–1.0)
Monocytes Relative: 2 %
Neutro Abs: 11.7 10*3/uL — ABNORMAL HIGH (ref 1.7–7.7)
Neutrophils Relative %: 94 %
Platelets: 253 10*3/uL (ref 150–400)
RBC: 3.56 MIL/uL — ABNORMAL LOW (ref 3.87–5.11)
RDW: 14.6 % (ref 11.5–15.5)
WBC: 12.5 10*3/uL — ABNORMAL HIGH (ref 4.0–10.5)
nRBC: 0 % (ref 0.0–0.2)

## 2021-11-06 LAB — MRSA NEXT GEN BY PCR, NASAL: MRSA by PCR Next Gen: NOT DETECTED

## 2021-11-06 LAB — STREP PNEUMONIAE URINARY ANTIGEN: Strep Pneumo Urinary Antigen: NEGATIVE

## 2021-11-06 LAB — HIV ANTIBODY (ROUTINE TESTING W REFLEX): HIV Screen 4th Generation wRfx: NONREACTIVE

## 2021-11-06 MED ORDER — HYDROCOD POLI-CHLORPHE POLI ER 10-8 MG/5ML PO SUER: 5.0000 mL | Freq: Two times a day (BID) | ORAL | Status: DC | PRN

## 2021-11-06 MED ORDER — HYDROMORPHONE HCL 1 MG/ML IJ SOLN
1.0000 mg | INTRAMUSCULAR | Status: DC | PRN
  Administered 2021-11-06 – 2021-11-10 (×20): 1 mg via INTRAVENOUS
  Filled 2021-11-06 (×22): qty 1

## 2021-11-06 MED ORDER — SODIUM CHLORIDE 0.9 % IV SOLN
2.0000 g | Freq: Two times a day (BID) | INTRAVENOUS | Status: DC
  Administered 2021-11-06 – 2021-11-10 (×8): 2 g via INTRAVENOUS
  Filled 2021-11-06 (×8): qty 12.5

## 2021-11-06 MED ORDER — ENSURE ENLIVE PO LIQD
237.0000 mL | Freq: Two times a day (BID) | ORAL | Status: DC
  Administered 2021-11-06 – 2021-11-09 (×6): 237 mL via ORAL

## 2021-11-06 MED ORDER — POTASSIUM CHLORIDE CRYS ER 20 MEQ PO TBCR
40.0000 meq | EXTENDED_RELEASE_TABLET | Freq: Once | ORAL | Status: AC
  Administered 2021-11-06: 40 meq via ORAL
  Filled 2021-11-06: qty 2

## 2021-11-06 NOTE — Hospital Course (Addendum)
76 y/o female with bladder cancer s/p excision and chemotherapy, COPD, recently discharged from [facility] for prolonged hospitalization for loculated pleural effusion on the left where she was noted to have mediastinal lymphadenopathy.  She underwent thoracentesis and chest tube placement.  She also had bronchoscopy with endoscopic ultrasound and lymph node biopsy and results were suspicious for non-small cell lung cancer.  Chest tube was removed on 10/29/2021 and patient followed up with her oncologist Dr. Earlie Server.  He had planned on sending her for PET scan and then offering immunotherapy treatment as a palliative measure sure in addition to palliative radiation therapy if needed.  She had only been home for a few days when she presented to the emergency department by EMS for shortness of breath uncontrolled pain and vomiting.  She apparently not been able to keep anything down and not able to take her medications for pain due to emesis.  She denies fever and chills.  Apparently her pulse ox dropped to 87% in the emergency department and she was placed on 2 L nasal cannula.  For CT angio chest with findings concerning for persistent left large pleural effusion and concern for empyema.  There is a loculated right pleural effusion present.  IR was consulted for consideration of thoracentesis.  I was able to confirm with patient and her husband that she is DNR.

## 2021-11-06 NOTE — Assessment & Plan Note (Signed)
--   repleted; recheck labs in AM

## 2021-11-06 NOTE — Progress Notes (Addendum)
PROGRESS NOTE   Rhonda Cook  NTI:[id number] DOB: [birth year] DOA: 10/31/2021 PCP: [facility]   Chief Complaint  Patient presents with   Shortness of Breath   Level of care: Telemetry  Brief Admission History:  76 y/o female with bladder cancer s/p excision and chemotherapy, COPD, recently discharged from [facility] for prolonged hospitalization for loculated pleural effusion on the left where she was noted to have mediastinal lymphadenopathy.  She underwent thoracentesis and chest tube placement.  She also had bronchoscopy with endoscopic ultrasound and lymph node biopsy and results were suspicious for non-small cell lung cancer.  Chest tube was removed on 10/29/2021 and patient followed up with her oncologist Dr. Earlie Server.  He had planned on sending her for PET scan and then offering immunotherapy treatment as a palliative measure sure in addition to palliative radiation therapy if needed.  She had only been home for a few days when she presented to the emergency department by EMS for shortness of breath uncontrolled pain and vomiting.  She apparently not been able to keep anything down and not able to take her medications for pain due to emesis.  She denies fever and chills.  Apparently her pulse ox dropped to 87% in the emergency department and she was placed on 2 L nasal cannula.  For CT angio chest with findings concerning for persistent left large pleural effusion and concern for empyema.  There is a loculated right pleural effusion present.  IR was consulted for consideration of thoracentesis.  I was able to confirm with patient and her husband that she is DNR.   Assessment and Plan: * Acute respiratory failure with hypoxia (HCC) - pulse ox down to 87% in ED and placed on supplemental [place] - Requiring 2 L nasal cannula still increased work of breathing even though maintaining oxygen sats -Patient reports that she would not want to go on BiPAP -admitting to telemetry  floor -CTA chest shows no PE but does show moderate left pleural effusion with findings concerning for empyema -Thoracentesis ordered  - consult to pulmonologist as patient may require CTS referral  -BNP 30.0 -Chest x-ray shows loculated left pleural effusion -Patient with penicillin allergy, started on Vanco and aztreonam in the ED->continue -covering for HCAP given that patient was just discharged from hospital  -The degree of bronchospasm is also likely contributing considering the patient was having symptomatic control with albuterol at home until she ran out of albuterol -Continue albuterol as needed -Continue mucolytics and flutter valve -Continue to monitor   Hypokalemia -- repleted; recheck labs in AM   DNR (do not resuscitate) -- I confirmed with patient and husband to continue DNR order in hospital   GERD (gastroesophageal reflux disease) Continue Pepcid  Hypothyroidism - Continue Synthroid  HLD (hyperlipidemia) - Continue statin  HTN (hypertension) - resume zestoretic when appropriate, BPs soft at this time  HCAP (healthcare-associated pneumonia) - Recently discharged from hospital 6 days ago -Possible " superinfection" noted on chest x-ray -Penicillin allergy, continue Vanco and aztreonam -with pharmacy to investigate if patient can tolerate cephalosporins -Leukocytosis has improved from 21.8>> 11.8 since last admission -Sputum culture -Blood cultures -Legionella and strep antigens -2 L bolus given in the ED -Continue to monitor  AKI (acute kidney injury) ([place]) - Mild AKI with an increase in creatinine from 0.75-1.09 -Holding lisinopril and hydrochlorothiazide for now, likely to be able to restart those in the next 24 hours  Protein-calorie malnutrition, moderate ([place]) - Albumin 2.7 - consult dietitian  -  regular diet   Loculated Left Pleural effusion - Findings concerning for empyema - She recently had a chest tube removed - consult to pulmonologist  on 7/10  - no IR service at [facility] on weekends, DC NPO and ordered diet - continue IV antibiotics and supplemental oxygen    DVT prophylaxis: SCDs Code Status: DNR, confirmed with patient and husband Family Communication: husband at bedside  Disposition: Status is: Inpatient Remains inpatient appropriate because: IV antibiotics, intensity    Consultants:  Pulmonologist  Procedures:   Antimicrobials:  Cefepime/vancomycin 7/8>> Subjective: Pt reports pain is uncontrolled, cough and SOB unchanged, she is still nauseated but not vomiting Objective: Vitals:   11/23/2021 2203 10/29/2021 2225 11/06/21 0215 11/06/21 0457  BP:  (!) 144/71 (!) 114/55 (!) 128/59  Pulse:  (!) 103 (!) 109 (!) 109  Resp:  20 18 20   Temp:  98.2 F (36.8 C) 98.9 F (37.2 C) (!) 97.5 F (36.4 C)  TempSrc:  Oral Oral Oral  SpO2: 96% 95% 95% 97%  Weight:      Height:        Intake/Output Summary (Last 24 hours) at 11/06/2021 1309 Last data filed at 11/06/2021 0907 Gross per 24 hour  Intake 1350.06 ml  Output 1 ml  Net 1349.06 ml   Filed Weights   11/25/2021 1443  Weight: 52.6 kg   Examination:  General exam: Appears calm and comfortable  Respiratory system: Clear to auscultation. Respiratory effort normal. Cardiovascular system: normal S1 & S2 heard. No JVD, murmurs, rubs, gallops or clicks. No pedal edema. Gastrointestinal system: Abdomen is nondistended, soft and nontender. No organomegaly or masses felt. Normal bowel sounds heard. Central nervous system: Alert and oriented. No focal neurological deficits. Extremities: Symmetric 5 x 5 power. Skin: No rashes, lesions or ulcers. Psychiatry: Judgement and insight appear normal. Mood & affect appropriate.   Data Reviewed: I have personally reviewed following labs and imaging studies  CBC: Recent Labs  Lab 11/03/21 0839 11/23/2021 1607 11/06/21 0716  WBC 21.8* 11.8* 12.5*  NEUTROABS 19.7* 10.3* 11.7*  HGB 13.4 11.9* 11.1*  HCT 39.1 35.4* 33.1*  MCV  89.9 92.4 93.0  PLT 390 284 829    Basic Metabolic Panel: Recent Labs  Lab 11/03/21 0839 11/08/2021 1607 11/06/21 0716  NA 132* 131* 132*  K 3.8 3.7 3.2*  CL 94* 93* 99  CO2 27 25 22   GLUCOSE 114* 96 93  BUN 18 27* 19  CREATININE 0.75 1.09* 0.64  CALCIUM 9.4 8.6* 8.1*  MG  --   --  1.9    CBG: No results for input(s): "GLUCAP" in the last 168 hours.  Recent Results (from the past 240 hour(s))  MRSA Next Gen by PCR, Nasal     Status: None   Collection Time: 11/18/2021  9:01 PM   Specimen: Nasal Mucosa; Nasal Swab  Result Value Ref Range Status   MRSA by PCR Next Gen NOT DETECTED NOT DETECTED Final    Comment: (NOTE) The GeneXpert MRSA Assay (FDA approved for NASAL specimens only), is one component of a comprehensive MRSA colonization surveillance program. It is not intended to diagnose MRSA infection nor to guide or monitor treatment for MRSA infections. Test performance is not FDA approved in patients less than 49 years old. Performed at [facility], [address] [place], [place] [address]   Culture, blood (Routine X 2) w Reflex to ID Panel     Status: None (Preliminary result)   Collection Time: 11/03/2021  9:41 PM  Specimen: BLOOD LEFT HAND  Result Value Ref Range Status   Specimen Description BLOOD LEFT HAND  Final   Special Requests   Final    BOTTLES DRAWN AEROBIC ONLY Blood Culture adequate volume   Culture   Final    NO GROWTH < 12 HOURS Performed at [facility], [address], [place], [place] [address]    Report Status PENDING  Incomplete  Culture, blood (Routine X 2) w Reflex to ID Panel     Status: None (Preliminary result)   Collection Time: 11/21/2021  9:50 PM   Specimen: BLOOD RIGHT HAND  Result Value Ref Range Status   Specimen Description BLOOD RIGHT HAND  Final   Special Requests   Final    BOTTLES DRAWN AEROBIC ONLY Blood Culture adequate volume   Culture   Final    NO GROWTH < 12 HOURS Performed at [facility], [address],  [place], [place] [address]    Report Status PENDING  Incomplete     Radiology Studies: CT Angio Chest PE W/Cm &/Or Wo Cm  Result Date: 11/21/2021 CLINICAL DATA:  High probability for pulmonary embolism. Shortness of breath with pain and vomiting. History of bladder cancer. EXAM: CT ANGIOGRAPHY CHEST WITH CONTRAST TECHNIQUE: Multidetector CT imaging of the chest was performed using the standard protocol during bolus administration of intravenous contrast. Multiplanar CT image reconstructions and MIPs were obtained to evaluate the vascular anatomy. RADIATION DOSE REDUCTION: This exam was performed according to the departmental dose-optimization program which includes automated exposure control, adjustment of the mA and/or kV according to patient size and/or use of iterative reconstruction technique. CONTRAST:  75mL OMNIPAQUE IOHEXOL 350 MG/ML SOLN COMPARISON:  CT chest 05/21/2021 FINDINGS: Cardiovascular: Satisfactory opacification of the pulmonary arteries to the segmental level. No evidence of pulmonary embolism. Normal heart size. No pericardial effusion. There are atherosclerotic calcifications of the aorta. Mediastinum/Nodes: There is an enlarged precarinal lymph node measuring 11 mm short axis. No other enlarged lymph nodes are identified. Thyroid gland is not visualized on this study. Esophagus is nondilated. Lungs/Pleura: Moderate size loculated left pleural effusion is again seen similar in size, there are new air-fluid levels throughout this effusion. There is diffuse left pleural thickening which appears similar to prior. Small right pleural effusion appears similar to the prior study, mildly loculated. Emphysematous changes are again seen. There is compressive atelectasis of the bilateral lower lobes similar to the prior study. Spiculated nodular density in the left lung apex measuring 1.4 by 1.0 cm image 6/25 appears unchanged from the prior study. Additional anterior left upper lobe nodule measuring 13  mm appears slightly smaller. Pleural-based and fissural based nodules throughout the right lung are unchanged. Trachea and central airways are patent. Additional smaller nodular densities in the left upper lobe measuring up to 6 mm appear unchanged. Upper Abdomen: Nodularity of the left adrenal unchanged. Musculoskeletal: There is left paraspinal muscular thickening measuring up to 2.6 cm in thickness axial image 4/113. This is incompletely imaged and appears similar to prior. No acute fractures are seen. Review of the MIP images confirms the above findings. IMPRESSION: 1. No pulmonary embolism identified. 2. Moderate-sized loculated left pleural effusion now contains multiple air-fluid levels. Findings are concerning for empyema. 3. Stable loculated right pleural effusion. 4. Single left upper lobe nodule has mildly decreased in size. Nodular densities are otherwise stable in both lungs with dominant spiculated nodule in the left lung apex. 5. Stable left paraspinous soft tissue thickening worrisome for metastatic disease. 6. Stable  enlarged precarinal lymph node. Electronically Signed   By: Ronney Asters M.D.   On: 11/24/2021 19:16   DG Chest 2 View  Result Date: 11/20/2021 CLINICAL DATA:  Back and hip pain. EXAM: CHEST - 2 VIEW COMPARISON:  10/29/2021 FINDINGS: The cardiopericardial silhouette is within normal limits for size. Chronic left effusion noted with multiple air-fluid levels. Small right pleural effusion evident. Interstitial markings are diffusely coarsened with chronic features. Bones are diffusely demineralized. IMPRESSION: 1. Chronic left effusion with multiple air-fluid levels. Patient had a left chest tube previously. Superinfection of the loculated left pleural fluid cannot be excluded. 2. Small right pleural effusion. Electronically Signed   By: Misty Stanley M.D.   On: 11/17/2021 17:02   DG Lumbar Spine 2-3 Views  Result Date: 11/07/2021 CLINICAL DATA:  Back pain. EXAM: LUMBAR SPINE - 2-3  VIEW COMPARISON:  None Available. FINDINGS: No evidence for an acute fracture. No subluxation. Loss of disc height noted L2-3 and L3-4. No worrisome lytic or sclerotic osseous abnormality. IMPRESSION: Degenerative changes without acute bony findings. Electronically Signed   By: Misty Stanley M.D.   On: 11/18/2021 17:00    Scheduled Meds:  aspirin EC  81 mg Oral Daily   famotidine  40 mg Oral Daily   feeding supplement  237 mL Oral BID BM   gabapentin  300 mg Oral QHS   guaiFENesin  600 mg Oral BID   heparin  5,000 Units Subcutaneous Q8H   levothyroxine  75 mcg Oral Daily   simvastatin  20 mg Oral QHS   Continuous Infusions:  ceFEPime (MAXIPIME) IV 2 g (11/06/21 1142)   vancomycin       LOS: 1 day   Time spent: 37 mins  Elfriede Bonini Wynetta Emery, MD How to contact the [facility] Attending or Consulting provider [place] or covering provider during after hours [place], for this patient?  Check the care team in [facility] and look for a) attending/consulting [facility] provider listed and b) the [facility] team listed Log into [email or web address] and use [place] universal password to access. If you do not have the password, please contact the hospital operator. Locate the [facility], The provider you are looking for under [facility] and page to a number that you can be directly reached. If you still have difficulty reaching the provider, please page the [facility] (Director on Call) for the Hospitalists listed on amion for assistance.  11/06/2021, 1:09 PM

## 2021-11-06 NOTE — Assessment & Plan Note (Signed)
--   I confirmed with patient and husband to continue DNR order in hospital

## 2021-11-06 NOTE — Plan of Care (Signed)

## 2021-11-07 ENCOUNTER — Inpatient Hospital Stay (HOSPITAL_COMMUNITY): Payer: PPO

## 2021-11-07 ENCOUNTER — Encounter (HOSPITAL_COMMUNITY): Payer: Self-pay | Admitting: Family Medicine

## 2021-11-07 ENCOUNTER — Other Ambulatory Visit (HOSPITAL_COMMUNITY): Payer: Self-pay | Admitting: Diagnostic Radiology

## 2021-11-07 DIAGNOSIS — J869 Pyothorax without fistula: Secondary | ICD-10-CM | POA: Diagnosis not present

## 2021-11-07 DIAGNOSIS — Z515 Encounter for palliative care: Secondary | ICD-10-CM

## 2021-11-07 DIAGNOSIS — E43 Unspecified severe protein-calorie malnutrition: Secondary | ICD-10-CM | POA: Insufficient documentation

## 2021-11-07 DIAGNOSIS — J90 Pleural effusion, not elsewhere classified: Secondary | ICD-10-CM | POA: Diagnosis not present

## 2021-11-07 DIAGNOSIS — Z7189 Other specified counseling: Secondary | ICD-10-CM

## 2021-11-07 DIAGNOSIS — C3490 Malignant neoplasm of unspecified part of unspecified bronchus or lung: Secondary | ICD-10-CM

## 2021-11-07 DIAGNOSIS — J9601 Acute respiratory failure with hypoxia: Secondary | ICD-10-CM | POA: Diagnosis not present

## 2021-11-07 DIAGNOSIS — J910 Malignant pleural effusion: Secondary | ICD-10-CM

## 2021-11-07 DIAGNOSIS — Z66 Do not resuscitate: Secondary | ICD-10-CM | POA: Diagnosis not present

## 2021-11-07 DIAGNOSIS — N179 Acute kidney failure, unspecified: Secondary | ICD-10-CM | POA: Diagnosis not present

## 2021-11-07 LAB — GLUCOSE, PLEURAL OR PERITONEAL FLUID: Glucose, Fluid: <20 mg/dL

## 2021-11-07 LAB — LACTATE DEHYDROGENASE, PLEURAL OR PERITONEAL FLUID: LD, Fluid: 1117 U/L — ABNORMAL HIGH (ref 3–23)

## 2021-11-07 LAB — GRAM STAIN

## 2021-11-07 LAB — BODY FLUID CELL COUNT WITH DIFFERENTIAL
Eos, Fluid: 0 %
Lymphs, Fluid: 30 %
Monocyte-Macrophage-Serous Fluid: 20 % — ABNORMAL LOW (ref 50–90)
Neutrophil Count, Fluid: 50 % — ABNORMAL HIGH (ref 0–25)
Total Nucleated Cell Count, Fluid: 104 cu mm (ref 0–1000)

## 2021-11-07 LAB — ALBUMIN, PLEURAL OR PERITONEAL FLUID: Albumin, Fluid: <1.5 g/dL

## 2021-11-07 LAB — BASIC METABOLIC PANEL
Anion gap: 7 (ref 5–15)
BUN: 22 mg/dL (ref 8–23)
CO2: 24 mmol/L (ref 22–32)
Calcium: 8.2 mg/dL — ABNORMAL LOW (ref 8.9–10.3)
Chloride: 103 mmol/L (ref 98–111)
Creatinine, Ser: 0.58 mg/dL (ref 0.44–1.00)
GFR, Estimated: >60 mL/min (ref >60)
Glucose, Bld: 112 mg/dL — ABNORMAL HIGH (ref 70–99)
Potassium: 3.8 mmol/L (ref 3.5–5.1)
Sodium: 134 mmol/L — ABNORMAL LOW (ref 135–145)

## 2021-11-07 LAB — LEGIONELLA PNEUMOPHILA SEROGP 1 UR AG: L. pneumophila Serogp 1 Ur Ag: NEGATIVE

## 2021-11-07 LAB — MAGNESIUM: Magnesium: 1.9 mg/dL (ref 1.7–2.4)

## 2021-11-07 MED ORDER — LISINOPRIL 10 MG PO TABS
10.0000 mg | ORAL_TABLET | Freq: Every day | ORAL | Status: DC
  Filled 2021-11-07: qty 1

## 2021-11-07 MED ORDER — LIDOCAINE HCL (PF) 2 % IJ SOLN
INTRAMUSCULAR | Status: AC
  Administered 2021-11-07: 10 mL
  Filled 2021-11-07: qty 10

## 2021-11-07 MED ORDER — METRONIDAZOLE 500 MG/100ML IV SOLN
500.0000 mg | Freq: Two times a day (BID) | INTRAVENOUS | Status: DC
Start: 2021-11-07 — End: 2021-11-10
  Administered 2021-11-07 – 2021-11-10 (×6): 500 mg via INTRAVENOUS
  Filled 2021-11-07 (×6): qty 100

## 2021-11-07 NOTE — Consult Note (Signed)
Consultation Note Date: 11/07/2021   Patient Name: Rhonda Cook  DOB: [birth year]  MRN: [id number]  Age / Sex: 76 y.o., female  PCP: [facility] Referring Physician: Murlean Iba, MD  Reason for Consultation: Establishing goals of care  HPI/Patient Profile: 76 y.o. female  with past medical history of bladder cancer status post excision and chemotherapy, COPD, recently discharged from [facility] for prolonged hospitalization for loculated pleural effusion on the left where she was noted to have mediastinal lymphadenopathy.  She underwent thoracentesis and chest tube placement, bronchoscopy with lymph node biopsy, results suspicious for non-small cell lung cancer, admitted on 11/08/2021 with acute respiratory failure with hypoxia.   Clinical Assessment and Goals of Care: I have reviewed medical records including EPIC notes, labs and imaging, received report from RN, assessed the patient.  Rhonda Cook, Rhonda Cook, is lying quietly in bed.  She appears acutely/chronically ill and frail.  She is alert and oriented x3, able to make her basic needs known.  Her husband, sister, and stepdaughter are present at bedside.  We meet to discuss diagnosis prognosis, GOC, EOL wishes, disposition and options.  I introduced Palliative Medicine as specialized medical care for people living with serious illness. It focuses on providing relief from the symptoms and stress of a serious illness. The goal is to improve quality of life for both the patient and the family.  We focused on their current illness.  We talk about her treatment plan with her trusted urologist, Dr. Gilford Rile, unfortunately she has not been able to make it to her last few treatments.  We talk about the plan for follow-up with Dr. Julien Nordmann, PET scan and then recommendations for treatment.  We talk in detail about her chest CT results, loculated pleural  effusions, questionable empyema, and the plan for draining.  The natural disease trajectory and expectations at EOL were discussed.   Advanced directives, concepts specific to code status, artifical feeding and hydration, and rehospitalization were considered and discussed.  We talk about the concept of "treat the treatable, but allowing natural passing".  Rhonda Cook states that she would not want extraordinary measures.  Hospice and Palliative Care services outpatient were explained and offered.  We talk about the benefits of outpatient palliative services for continued goals of care discussions.  We also talk about the benefits of hospice care if/when she is ready.  Discussed the importance of continued conversation with family and the medical providers regarding overall plan of care and treatment options, ensuring decisions are within the context of the patient's values and GOCs.  Questions and concerns were addressed.  The family was encouraged to call with questions or concerns.  PMT will continue to support holistically.  Conference with attending, bedside nursing staff, transition of care team related to patient condition, needs, goals of care, disposition.    HCPOA  NEXT OF KIN -spouse of 52 years, Rhonda Cook.  She states that she would want her daughter, Rhonda Cook, input also.    SUMMARY OF RECOMMENDATIONS   At  this point continue to treat the treatable but no CPR or intubation Awaiting PET scan before follow-up with Dr. Julien Nordmann Anticipate home with home health/outpatient palliative  Code Status/Advance Care Planning: DNR  Symptom Management:  Per hospitalist, no additional needs at this time.  Palliative Prophylaxis:  Frequent Pain Assessment, Oral Care, and Turn Reposition  Additional Recommendations (Limitations, Scope, Preferences): At this point, continue to treat the treatable but no CPR or intubation.  Psycho-social/Spiritual:  Desire for further Chaplaincy  support:yes Additional Recommendations: Caregiving  Support/Resources and Education on Hospice  Prognosis:  Unable to determine, based on outcomes and oncology recommendations/treatment plan.  Discharge Planning:  To be determined, based on outcomes.  Anticipate home with home health and outpatient palliative services.       Primary Diagnoses: Present on Admission:  Acute respiratory failure with hypoxia (HCC)  Protein-calorie malnutrition, moderate (HCC)  AKI (acute kidney injury) ([place])  HCAP (healthcare-associated pneumonia)  Loculated Left Pleural effusion  HTN (hypertension)  HLD (hyperlipidemia)  Hypothyroidism  GERD (gastroesophageal reflux disease)  DNR (do not resuscitate)   I have reviewed the medical record, interviewed the patient and family, and examined the patient. The following aspects are pertinent.  Past Medical History:  Diagnosis Date   Arthritis    Autoimmune thyroiditis    followed by pcp   Bladder cancer [facility])    urologist-- dr winter---  s/p TURBT 05-31-2018; 07-23-2018   Chronic constipation    Chronically dry eyes    COPD (chronic obstructive pulmonary disease) ([place]) (09-11-2019 denies sob or difficulty breathing,  no URI in past month)   followed by pcp--- per pt never used oxygen, has rescue inhaler and nebulizer prn   Diverticulosis of colon    History of adenomatous polyp of colon 08/ 16/ 2017   History of recurrent UTIs    Hyperlipidemia    Hypertension    followed by pcp   (09-11-2019 pt stated never had a stress test)   Hypothyroidism    Nocturia    Osteoporosis    PONV (postoperative nausea and vomiting)    slow to awaken, shakes if under too long   Social History   Socioeconomic History   Marital status: Married    Spouse name: Not on file   Number of children: Not on file   Years of education: Not on file   Highest education level: Not on file  Occupational History   Not on file  Tobacco Use   Smoking status: Former     Packs/day: 0.25    Years: 58.00    Total pack years: 14.50    Types: Cigarettes    Quit date: 09/30/2021    Years since quitting: 0.1   Smokeless tobacco: Never   Tobacco comments:    09-11-2019  per pt trying to quit,  down to 1/2 pp3d  Vaping Use   Vaping Use: Never used  Substance and Sexual Activity   Alcohol use: No   Drug use: Never   Sexual activity: Not on file  Other Topics Concern   Not on file  Social History Narrative   Not on file   Social Determinants of Health   Financial Resource Strain: Not on file  Food Insecurity: Not on file  Transportation Needs: Not on file  Physical Activity: Not on file  Stress: Not on file  Social Connections: Not on file   History reviewed. No pertinent family history. Scheduled Meds:  aspirin EC  81 mg Oral Daily   famotidine  40 mg Oral Daily   feeding supplement  237 mL Oral BID BM   gabapentin  300 mg Oral QHS   guaiFENesin  600 mg Oral BID   levothyroxine  75 mcg Oral Daily   simvastatin  20 mg Oral QHS   Continuous Infusions:  ceFEPime (MAXIPIME) IV 2 g (11/07/21 1112)   metronidazole 500 mg (11/07/21 1353)   PRN Meds:.acetaminophen **OR** acetaminophen, albuterol, chlorpheniramine-HYDROcodone, HYDROcodone-acetaminophen, HYDROmorphone (DILAUDID) injection, ondansetron **OR** ondansetron (ZOFRAN) IV, polyethylene glycol, tetrahydrozoline Medications Prior to Admission:  Prior to Admission medications   Medication Sig Start Date End Date Taking? Authorizing Provider  aspirin EC 81 MG tablet Take 1 tablet (81 mg total) by mouth daily. 12/23/15  Yes Rehman, Mechele Dawley, MD  CALCIUM PO Take 1 tablet by mouth daily.   Yes [provider]  celecoxib (CELEBREX) 200 MG capsule Take 200 mg by mouth 2 (two) times daily. 09/20/21  Yes [provider]  cholecalciferol (VITAMIN D3) 25 MCG (1000 UNIT) tablet Take 1,000 Units by mouth daily.   Yes [provider]  docusate sodium (COLACE) 100 MG capsule Take 100  mg by mouth at bedtime.   Yes [provider]  famotidine (PEPCID) 40 MG tablet Take 40 mg by mouth daily. 09/20/21  Yes [provider]  Ferrous Sulfate (IRON PO) Take 1 tablet by mouth daily.   Yes [provider]  gabapentin (NEURONTIN) 300 MG capsule Take 300 mg by mouth at bedtime. 09/20/21  Yes [provider]  HYDROcodone-acetaminophen (NORCO) 5-325 MG tablet Take 1 tablet by mouth every 4 (four) hours as needed for up to 5 days for moderate pain. 11/03/21 11/08/21 Yes Curt Bears, MD  levothyroxine (SYNTHROID, LEVOTHROID) 75 MCG tablet Take 75 mcg by mouth daily.  04/18/18  Yes [provider]  lisinopril-hydrochlorothiazide (PRINZIDE,ZESTORETIC) 10-12.5 MG tablet Take 1 tablet by mouth daily.  04/06/18  Yes [provider]  Multiple Vitamin (MULTIVITAMIN WITH MINERALS) TABS tablet Take 1 tablet by mouth daily. 10/31/21  Yes Pokhrel, Laxman, MD  ondansetron (ZOFRAN) 4 MG tablet Take 1 tablet (4 mg total) by mouth every 6 (six) hours. As needed for nausea and vomiting 10/10/21  Yes Triplett, Tammy, PA-C  polyethylene glycol (MIRALAX / GLYCOLAX) 17 g packet Take 17 g by mouth daily as needed for mild constipation or moderate constipation. 10/30/21  Yes Pokhrel, Laxman, MD  Probiotic Product (PROBIOTIC-10 PO) Take 1 capsule by mouth daily.   Yes [provider]  promethazine (PHENERGAN) 12.5 MG tablet Take 12.5 mg by mouth 3 (three) times daily as needed for nausea or vomiting. 10/11/21  Yes [provider]  simvastatin (ZOCOR) 20 MG tablet Take 20 mg by mouth at bedtime.  11/29/15  Yes [provider]  Tetrahydrozoline HCl (VISINE OP) Place 1 drop into both eyes daily as needed (dry eyes).   Yes [provider]  albuterol (PROAIR HFA) 108 (90 Base) MCG/ACT inhaler Inhale 2 puffs into the lungs every 4 (four) hours as needed for wheezing or shortness of breath.  Patient not taking: Reported on 11/06/2021 03/26/17    [provider]   Allergies  Allergen Reactions   Oxycodone Itching and Nausea And Vomiting   Adhesive [Tape] Rash   Penicillins Nausea And Vomiting and Rash    Did it involve swelling of the face/tongue/throat, SOB, or low BP? no Did it involve sudden or severe rash/hives, skin peeling, or any reaction on the inside of your mouth or nose? yes Did you  need to seek medical attention at a hospital or doctor's office? no When did it last happen?  20 years ago     If all above answers are "NO", may proceed with cephalosporin use.   Wound Dressing Adhesive Rash   Review of Systems  Unable to perform ROS: Acuity of condition    Physical Exam Vitals and nursing note reviewed.  Cardiovascular:     Rate and Rhythm: Normal rate.  Pulmonary:     Effort: Pulmonary effort is normal. No tachypnea.  Skin:    General: Skin is warm and dry.  Neurological:     Mental Status: She is alert and oriented to person, place, and time.  Psychiatric:        Mood and Affect: Mood normal.        Behavior: Behavior normal.     Vital Signs: BP (!) 119/50 (BP Location: Right Arm)   Pulse (!) 106   Temp (!) 97.5 F (36.4 C)   Resp 18   Ht 5\' 3"  (1.6 m)   Wt 52.6 kg   SpO2 98%   BMI 20.55 kg/m  Pain Scale: 0-10 POSS *See Group Information*: S-Acceptable,Sleep, easy to arouse Pain Score: 4    SpO2: SpO2: 98 % O2 Device:SpO2: 98 % O2 Flow Rate: .O2 Flow Rate (L/min): 2 L/min  IO: Intake/output summary:  Intake/Output Summary (Last 24 hours) at 11/07/2021 1514 Last data filed at 11/07/2021 0533 Gross per 24 hour  Intake --  Output 250 ml  Net -250 ml    LBM: Last BM Date : 11/06/21 Baseline Weight: Weight: 52.6 kg Most recent weight: Weight: 52.6 kg     Palliative Assessment/Data:   Flowsheet Rows    Flowsheet Row Most Recent Value  Intake Tab   Referral Department Hospitalist  Unit at Time of Referral Med/Surg Unit  Palliative Care Primary Diagnosis Cancer  Date Notified  11/04/2021  Palliative Care Type Return patient Palliative Care  Reason for referral Clarify Goals of Care  Date of Admission 11/16/2021  Date first seen by Palliative Care 11/07/21  # of days Palliative referral response time 2 Day(s)  # of days IP prior to Palliative referral 0  Clinical Assessment   Palliative Performance Scale Score 40%  Pain Max last 24 hours Not able to report  Pain Min Last 24 hours Not able to report  Dyspnea Max Last 24 Hours Not able to report  Dyspnea Min Last 24 hours Not able to report  Psychosocial & Spiritual Assessment   Palliative Care Outcomes        Time In: 1220 Time Out: 1335 Time Total: 75 minutes  Greater than 50%  of this time was spent counseling and coordinating care related to the above assessment and plan.  Signed by: Drue Novel, NP   Please contact Palliative Medicine Team phone at [phone] for questions and concerns.  For individual provider: See Shea Evans

## 2021-11-07 NOTE — Progress Notes (Signed)
Ty, patient aware of results already. Has seen Dr. Julien Nordmann in oncology   Thanks,  [place], DO [facility] 11/07/2021 2:21 PM

## 2021-11-07 NOTE — Plan of Care (Signed)
Pt is alert and oriented x 4. Increased/continued weakness. Pt continues to have poor appetite even though nausea has improved. Pt didn't eat any supper. Complaint loss of appetite.  No zofran given this shift. Pt took hs meds without nausea complaint. Pt receiving Dilaudid for pain and more effective in controlling pain. Pt has received 2 doses of dilaudid so far this shift. Pt refused to turn in bed. Pt sacrum elevated off bed with two pillow on each buttocks per request.  Problem: Activity: Goal: Risk for activity intolerance will decrease Outcome: Not Progressing   Problem: Nutrition: Goal: Adequate nutrition will be maintained Outcome: Not Progressing   Problem: Education: Goal: Knowledge of General Education information will improve Description: Including pain rating scale, medication(s)/side effects and non-pharmacologic comfort measures Outcome: Progressing   Problem: Health Behavior/Discharge Planning: Goal: Ability to manage health-related needs will improve Outcome: Progressing   Problem: Clinical Measurements: Goal: Ability to maintain clinical measurements within normal limits will improve Outcome: Progressing Goal: Will remain free from infection Outcome: Progressing Goal: Diagnostic test results will improve Outcome: Progressing Goal: Respiratory complications will improve Outcome: Progressing Goal: Cardiovascular complication will be avoided Outcome: Progressing   Problem: Elimination: Goal: Will not experience complications related to bowel motility Outcome: Progressing   Problem: Pain Managment: Goal: General experience of comfort will improve Outcome: Progressing

## 2021-11-07 NOTE — Consult Note (Signed)
[facility]   Patient name: Rhonda Cook Admit date: 11/22/2021  DOB: 11-Jul-1945 LOS: 2  MRN: [id number] Consult date: 11/07/2021  Referring provider: Dr. Wynetta Emery, [facility]: Pleural effusion    History:  76 yo female smoker was in hospital in June 2023 with dyspnea and cough from large bilateral pleural effusions.  Effusions were exudate with lymphocyte predominance and atypical cells concerning for malignancy.  She had pleural catheter placed by IR on 10/25/21.  She had bronchoscopy with Dr. Valeta Harms on 10/28/21 to assess lung nodules and mediastinal adenopathy, with needle biopsy of 4R lymph node which was positive for NSCLC (squamous cell carcinoma).  She had chest tube removed on 10/29/21.  She had appointment with Dr. 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  She was to have MRI brain and PET scan for staging work up.  She presented to [facility] on [facility]21 for dyspnea, back pain and vomiting.  Was having sweats and chest pain also.  Past medical history:  Osteoarthritis, Autoimmune thyroiditis with hypothyroidism, Bladder cancer s/p surgical excision, COPD, HTN, HLD  Significant events:  7/08 Admit to [facility], start ABx 7/10 IR Lt thoracentesis, arrange for transfer to [facility]  Studies:  CT angio chest 7/08 >> moderate loculated Lt effusion with new air-fluid levels throughout, Lt pleural thickening, changes of emphysema, smaller Rt pleural effusion 1.4 x 1 cm spiculated nodule Lt apex, 13 mm nodule LUL, pleural based and fissural nodules throughout Rt lung Lt thoracentesis 7/10 >> 270 ml amber fluid, glucose less than 20, LDH 1117, WBC 104 (50% N, 30% L, 20% M)  Micro:  Blood 7/08 >> Lt pleural fluid 7/10 >>   Lines:     Antibiotics:  Vancomycin 7/08 >> Cefepime 7/08 >> Flagyl 7/10 >>   Consults:      Interim history:  Breathing improved some after thoracentesis.  Still has cough and  chest discomfort.  Vital signs:  BP 133/67 (BP Location: Right Arm)   Pulse 95   Temp 97.8 F (36.6 C) (Oral)   Resp 20   Ht 5\' 3"  (1.6 m)   Wt 52.6 kg   SpO2 98%   BMI 20.55 kg/m   Intake/output:  I/O last 3 completed shifts: In: 1930.1 [P.O.:480; IV Piggyback:1450.1] Out: 251 [Urine:251]   Physical exam:   General - alert Eyes - pupils reactive ENT - no sinus tenderness, no stridor Cardiac - regular rate/rhythm, no murmur Chest - decreased BS at bases b/l Abdomen - soft, non tender, + bowel sounds Extremities - no cyanosis, clubbing, or edema Skin - no rashes Neuro - normal strength, moves extremities, follows commands Psych - normal mood and behavior  Best practice:   DVT - SCD SUP - N/A Nutrition - regular   Assessment/plan:   Exudate Lt pleural effusion with loculation. - findings are concerning for infection in setting of malignant effusion - continue ABx - will need to transfer to [place] for repeat chest tube insertion and possible pleurolytic therapy, but this might be tricky in setting of malignancy - doubt she would a surgical candidate in setting of Stage IV lung cancer  Stage IV NSCLC (squamous cell). - followed by Dr. Julien Nordmann as outpt  Hypothyroidism. GERD. HLD. HTN. Moderate protein calorie malnutrition. - per primary team  D/w Dr. Wynetta Emery.  Have communicated with [facility] about plan.  Resolved hospital problems:    Goals of care/Family discussions:  Code status: DNR  Updated her  husband and daughter at bedside about plan.  Labs:      Latest Ref Rng & Units 11/07/2021    5:59 AM 11/06/2021    7:16 AM 11/12/2021    4:07 PM  CMP  Glucose 70 - 99 mg/dL 112  93  96   BUN 8 - 23 mg/dL 22  19  27    Creatinine 0.44 - 1.00 mg/dL 0.58  0.64  1.09   Sodium 135 - 145 mmol/L 134  132  131   Potassium 3.5 - 5.1 mmol/L 3.8  3.2  3.7   Chloride 98 - 111 mmol/L 103  99  93   CO2 22 - 32 mmol/L 24  22  25    Calcium 8.9 - 10.3  mg/dL 8.2  8.1  8.6   Total Protein 6.5 - 8.1 g/dL  5.2  5.8   Total Bilirubin 0.3 - 1.2 mg/dL  0.7  0.6   Alkaline Phos 38 - 126 U/L  72  84   AST 15 - 41 U/L  13  16   ALT 0 - 44 U/L  18  20        Latest Ref Rng & Units 11/06/2021    7:16 AM 11/21/2021    4:07 PM 11/03/2021    8:39 AM  CBC  WBC 4.0 - 10.5 K/uL 12.5  11.8  21.8   Hemoglobin 12.0 - 15.0 g/dL 11.1  11.9  13.4   Hematocrit 36.0 - 46.0 % 33.1  35.4  39.1   Platelets 150 - 400 K/uL 253  284  390     ABG    Component Value Date/Time   TCO2 26 09/16/2019 1015    CBG (last 3)  No results for input(s): "GLUCAP" in the last 72 hours.   Past surgical history:  She  has a past surgical history that includes Colonoscopy (N/A, 12/15/2015); Foot surgery (Right, 2018); Transurethral resection of bladder tumor (N/A, 05/31/2018); Cystoscopy with biopsy (N/A, 07/23/2018); Laparoscopic cholecystectomy (2000); Cystoscopy with biopsy (N/A, 09/16/2019); IR PERC PLEURAL DRAIN W/INDWELL CATH W/IMG GUIDE (10/25/2021); Video bronchoscopy with endobronchial ultrasound (Bilateral, 10/28/2021); and Bronchial needle aspiration biopsy (10/28/2021).  Social history:  She  reports that she quit smoking about 5 weeks ago. Her smoking use included cigarettes. She has a 14.50 pack-year smoking history. She has never used smokeless tobacco. She reports that she does not drink alcohol and does not use drugs.   Review of systems:  Reviewed and negative  Family history:  Her family history is not on file.    Medications:   No current facility-administered medications on file prior to encounter.   Current Outpatient Medications on File Prior to Encounter  Medication Sig   aspirin EC 81 MG tablet Take 1 tablet (81 mg total) by mouth daily.   CALCIUM PO Take 1 tablet by mouth daily.   celecoxib (CELEBREX) 200 MG capsule Take 200 mg by mouth 2 (two) times daily.   cholecalciferol (VITAMIN D3) 25 MCG (1000 UNIT) tablet Take 1,000 Units by mouth daily.    docusate sodium (COLACE) 100 MG capsule Take 100 mg by mouth at bedtime.   famotidine (PEPCID) 40 MG tablet Take 40 mg by mouth daily.   Ferrous Sulfate (IRON PO) Take 1 tablet by mouth daily.   gabapentin (NEURONTIN) 300 MG capsule Take 300 mg by mouth at bedtime.   HYDROcodone-acetaminophen (NORCO) 5-325 MG tablet Take 1 tablet by mouth every 4 (four) hours as needed for up to 5 days  for moderate pain.   levothyroxine (SYNTHROID, LEVOTHROID) 75 MCG tablet Take 75 mcg by mouth daily.    lisinopril-hydrochlorothiazide (PRINZIDE,ZESTORETIC) 10-12.5 MG tablet Take 1 tablet by mouth daily.    Multiple Vitamin (MULTIVITAMIN WITH MINERALS) TABS tablet Take 1 tablet by mouth daily.   ondansetron (ZOFRAN) 4 MG tablet Take 1 tablet (4 mg total) by mouth every 6 (six) hours. As needed for nausea and vomiting   polyethylene glycol (MIRALAX / GLYCOLAX) 17 g packet Take 17 g by mouth daily as needed for mild constipation or moderate constipation.   Probiotic Product (PROBIOTIC-10 PO) Take 1 capsule by mouth daily.   promethazine (PHENERGAN) 12.5 MG tablet Take 12.5 mg by mouth 3 (three) times daily as needed for nausea or vomiting.   simvastatin (ZOCOR) 20 MG tablet Take 20 mg by mouth at bedtime.    Tetrahydrozoline HCl (VISINE OP) Place 1 drop into both eyes daily as needed (dry eyes).   albuterol (PROAIR HFA) 108 (90 Base) MCG/ACT inhaler Inhale 2 puffs into the lungs every 4 (four) hours as needed for wheezing or shortness of breath.  (Patient not taking: Reported on 11/06/2021)     Signature:  Chesley Mires, MD [place] Pager - [phone] 11/07/2021, 3:06 PM

## 2021-11-07 NOTE — Progress Notes (Signed)
Report called to Lattie Haw, RN at [place].

## 2021-11-07 NOTE — Progress Notes (Signed)
Initial Nutrition Assessment  DOCUMENTATION CODES:   Severe malnutrition in context of chronic illness  INTERVENTION:  Ensure Enlive po BID, (chocolate flavor)  Magic cup with lunch daily  Multivitamin daily   NUTRITION DIAGNOSIS:   Severe Malnutrition related to cancer and cancer related treatments as evidenced by moderate fat depletion, severe fat depletion, moderate muscle depletion, severe muscle depletion, mild muscle depletion, mild fat depletion.  GOAL:  Patient will meet greater than or equal to 90% of their needs  MONITOR:  PO intake, Supplement acceptance, Labs, Skin, Weight trends  REASON FOR ASSESSMENT:   Malnutrition Screening Tool    ASSESSMENT: Patient is a 76 yo female with hx of bladder cancer (chemo and s/p excision), chronic constipation, COPD. Recent lung cancer diagnosis and prolonged hospitalization- PE. Presents short of breath, pain, vomiting.  Pneumonia.  Regular diet- meal intakes: no dinner last night but nausea improved per nursing. Patient husband bedside and says she has not been as usual for at least 1 month. Her breakfast tray is here and untouched but patient husband brought in a bacon and tomato biscuit which she ate all but a couple of bites. Daughter in law is out to pick up something for lunch. She likes Ensure (chocolate ONLY).   Shortly after RD arrived - staff come by to take patient for procedure (thoracentesis). Per discussion with nursing and rounds transfer request has been made for bed at [facility] or [facility] -> chest tube placement.  Acute weight loss. Patient reports usual weight of~ 135 lb (61.3 kg). Currently 115.7 lb (52.6 kg). Patient weight in June 124.7 lb (7%) loss in < 1 month which is significant.  Medications: famotidine, gabapentin. IV-Maxipime and Vancomycin.      Latest Ref Rng & Units 11/07/2021    5:59 AM 11/06/2021    7:16 AM 10/29/2021    4:07 PM  BMP  Glucose 70 - 99 mg/dL 112  93  96   BUN 8 - 23 mg/dL 22  19  27     Creatinine 0.44 - 1.00 mg/dL 0.58  0.64  1.09   Sodium 135 - 145 mmol/L 134  132  131   Potassium 3.5 - 5.1 mmol/L 3.8  3.2  3.7   Chloride 98 - 111 mmol/L 103  99  93   CO2 22 - 32 mmol/L 24  22  25    Calcium 8.9 - 10.3 mg/dL 8.2  8.1  8.6       NUTRITION - FOCUSED PHYSICAL EXAM: Severe muscle and fat depletions   Diet Order:   Diet Order             Diet regular Room service appropriate? Yes; Fluid consistency: Thin  Diet effective now                   EDUCATION NEEDS:  Education needs have been addressed  Skin:  Skin Assessment: Reviewed RN Assessment  Last BM:  7/9  Height:   Ht Readings from Last 1 Encounters:  11/20/2021 5\' 3"  (1.6 m)    Weight:   Wt Readings from Last 1 Encounters:  11/13/2021 52.6 kg    Ideal Body Weight:   52 kg  BMI:  Body mass index is 20.55 kg/m.  Estimated Nutritional Needs:   Kcal:  1700-1900  Protein:  80-89gr  Fluid:  1600 ml daily  Colman Cater MS,RD,CSG,LDN Contact: Shea Evans

## 2021-11-07 NOTE — TOC Progression Note (Signed)
  Transition of Care [facility]) Screening Note   Patient Details  Name: Rhonda Cook Date of Birth: 02-May-1945   Transition of Care [facility]) CM/SW Contact:    Shade Flood, LCSW Phone Number: 11/07/2021, 11:32 AM    Transition of Care Department [facility]) has reviewed patient and no TOC needs have been identified at this time. We will continue to monitor patient advancement through interdisciplinary progression rounds. If new patient transition needs arise, please place a TOC consult.

## 2021-11-07 NOTE — Procedures (Signed)
PreOperative Dx: Recurrent LEFT pleural effusion Postoperative Dx: Recurrent LEFT pleural effusion Procedure:   US guided LEFt thoracentesis Radiologist:  Thornton Papas Anesthesia:  10 ml of 1% lidocaine Specimen:  270 mL of amber colored fluid EBL:   < 1 ml Complications: None

## 2021-11-07 NOTE — Progress Notes (Signed)
PT tolerated left sided thoracentesis well today and 270 mL of clear amber colored fluid removed and sent to lab for processing. PT taken to chest xray post procedure and read by radiologist then transported via stretcher back to inpatient bed assignment with no acute distress noted. PT verbalized understanding of post procedure instructions.

## 2021-11-07 NOTE — Care Management Important Message (Signed)
Important Message  Patient Details  Name: Rhonda Cook MRN: [id number] Date of Birth: [birth year]   Medicare Important Message Given:  Other (see comment) (declined copy at this time)     Tommy Medal 11/07/2021, 11:33 AM

## 2021-11-07 NOTE — Progress Notes (Signed)
PROGRESS NOTE   Rhonda Cook  ZYS:[id number] DOB: 26-Apr-1946 DOA: 11/04/2021 PCP: [facility]   Chief Complaint  Patient presents with   Shortness of Breath   Level of care: Med-Surg  Brief Admission History:  76 y/o female with bladder cancer s/p excision and chemotherapy, COPD, recently discharged from [facility] for prolonged hospitalization for loculated pleural effusion on the left where she was noted to have mediastinal lymphadenopathy.  She underwent thoracentesis and chest tube placement.  She also had bronchoscopy with endoscopic ultrasound and lymph node biopsy and results were suspicious for non-small cell lung cancer.  Chest tube was removed on 10/29/2021 and patient followed up with her oncologist Dr. Earlie Server.  He had planned on sending her for PET scan and then offering immunotherapy treatment as a palliative measure sure in addition to palliative radiation therapy if needed.  She had only been home for a few days when she presented to the emergency department by EMS for shortness of breath uncontrolled pain and vomiting.  She apparently not been able to keep anything down and not able to take her medications for pain due to emesis.  She denies fever and chills.  Apparently her pulse ox dropped to 87% in the emergency department and she was placed on 2 L nasal cannula.  For CT angio chest with findings concerning for persistent left large pleural effusion and concern for empyema.  There is a loculated right pleural effusion present.  IR was consulted for consideration of thoracentesis.  I was able to confirm with patient and her husband that she is DNR.   Assessment and Plan: * Acute respiratory failure with hypoxia (HCC) - pulse ox down to 87% in ED and placed on supplemental [place] - Requiring 2 L nasal cannula still increased work of breathing even though maintaining oxygen sats -Patient reports that she would not want to go on BiPAP -admitting to telemetry  floor -CTA chest shows no PE but does show moderate left pleural effusion with findings concerning for empyema -Thoracentesis ordered  - consult to pulmonologist as patient may require CTS referral  -BNP 30.0 -Chest x-ray shows loculated left pleural effusion -Patient with penicillin allergy, started on Vanco and aztreonam in the ED->continue -covering for HCAP given that patient was just discharged from hospital  -The degree of bronchospasm is also likely contributing considering the patient was having symptomatic control with albuterol at home until she ran out of albuterol -Continue albuterol as needed -Continue mucolytics and flutter valve -Continue to monitor - morphine for pain and SOB symptoms    Hypokalemia -- repleted; recheck labs in AM   DNR (do not resuscitate) -- I confirmed with patient and husband to continue DNR order in hospital   GERD (gastroesophageal reflux disease) Continue Pepcid  Hypothyroidism - Continue Synthroid  HLD (hyperlipidemia) - Continue statin  HTN (hypertension) - resuming lisinopril 10 mg daily  HCAP (healthcare-associated pneumonia) - Recently discharged from hospital 6 days prior to this admission -Possible " superinfection" noted on chest x-ray -Penicillin allergy, continue Vanco and aztreonam -with pharmacy to investigate if patient can tolerate cephalosporins -Leukocytosis has improved from 21.8>> 11.8 since last admission -Sputum culture -Blood cultures -Legionella and strep antigens -2 L bolus given in the ED -Continue to monitor  AKI (acute kidney injury) ([place]) - start lisinopril as renal function back to normal    Protein-calorie malnutrition, moderate (HCC) - Albumin 2.7 - consult dietitian  - regular diet   Loculated Left Pleural effusion -  Findings concerning for empyema - She recently had a chest tube removed - consult to pulmonologist on 7/10  - no IR service at [facility] on weekends - continue IV antibiotics and  supplemental oxygen  - I spoke with Dr. Thornton Papas and requested fluid sampling today - I spoke with pulmonologist, will need transfer to [facility] or [facility], transfer orders placed   DVT prophylaxis: SCDs Code Status: DNR, confirmed with patient and husband Family Communication: husband at bedside  Disposition: Status is: Inpatient Remains inpatient appropriate because: IV antibiotics, intensity    Consultants:  Pulmonologist  Procedures:   Antimicrobials:  Cefepime/vancomycin 7/8>> Subjective: Pt reports pain is uncontrolled and her SOB is getting worse, cough is getting worse.  Objective: Vitals:   11/06/21 2123 11/07/21 0508 11/07/21 1250 11/07/21 1300  BP: (!) 113/53 133/67 132/77 (!) 122/56  Pulse: 98 95 (!) 102 (!) 108  Resp: 18 20 20 20   Temp: 97.8 F (36.6 C) 97.8 F (36.6 C) 97.8 F (36.6 C)   TempSrc:  Oral Oral   SpO2: 95% 98% 99% 98%  Weight:      Height:        Intake/Output Summary (Last 24 hours) at 11/07/2021 1340 Last data filed at 11/07/2021 0533 Gross per 24 hour  Intake 580 ml  Output 250 ml  Net 330 ml   Filed Weights   11/06/2021 1443  Weight: 52.6 kg   Examination:  General exam: Appears calm and comfortable  Respiratory system: Clear to auscultation. Respiratory effort normal. Cardiovascular system: normal S1 & S2 heard. No JVD, murmurs, rubs, gallops or clicks. No pedal edema. Gastrointestinal system: Abdomen is nondistended, soft and nontender. No organomegaly or masses felt. Normal bowel sounds heard. Central nervous system: Alert and oriented. No focal neurological deficits. Extremities: Symmetric 5 x 5 power. Skin: No rashes, lesions or ulcers. Psychiatry: Judgement and insight appear normal. Mood & affect appropriate.   Data Reviewed: I have personally reviewed following labs and imaging studies  CBC: Recent Labs  Lab 11/03/21 0839 11/28/2021 1607 11/06/21 0716  WBC 21.8* 11.8* 12.5*  NEUTROABS 19.7* 10.3* 11.7*  HGB 13.4 11.9* 11.1*  HCT  39.1 35.4* 33.1*  MCV 89.9 92.4 93.0  PLT 390 284 716    Basic Metabolic Panel: Recent Labs  Lab 11/03/21 0839 11/06/2021 1607 11/06/21 0716 11/07/21 0559  NA 132* 131* 132* 134*  K 3.8 3.7 3.2* 3.8  CL 94* 93* 99 103  CO2 27 25 22 24   GLUCOSE 114* 96 93 112*  BUN 18 27* 19 22  CREATININE 0.75 1.09* 0.64 0.58  CALCIUM 9.4 8.6* 8.1* 8.2*  MG  --   --  1.9 1.9    CBG: No results for input(s): "GLUCAP" in the last 168 hours.  Recent Results (from the past 240 hour(s))  MRSA Next Gen by PCR, Nasal     Status: None   Collection Time: 11/15/2021  9:01 PM   Specimen: Nasal Mucosa; Nasal Swab  Result Value Ref Range Status   MRSA by PCR Next Gen NOT DETECTED NOT DETECTED Final    Comment: (NOTE) The GeneXpert MRSA Assay (FDA approved for NASAL specimens only), is one component of a comprehensive MRSA colonization surveillance program. It is not intended to diagnose MRSA infection nor to guide or monitor treatment for MRSA infections. Test performance is not FDA approved in patients less than 63 years old. Performed at [facility], [address], [place], [place] [address]   Culture, blood (Routine X 2) w  Reflex to ID Panel     Status: None (Preliminary result)   Collection Time: 11/13/2021  9:41 PM   Specimen: BLOOD LEFT HAND  Result Value Ref Range Status   Specimen Description BLOOD LEFT HAND  Final   Special Requests   Final    BOTTLES DRAWN AEROBIC ONLY Blood Culture adequate volume   Culture   Final    NO GROWTH 2 DAYS Performed at [facility], [address], [place], [place] [address]    Report Status PENDING  Incomplete  Culture, blood (Routine X 2) w Reflex to ID Panel     Status: None (Preliminary result)   Collection Time: 11/16/2021  9:50 PM   Specimen: BLOOD RIGHT HAND  Result Value Ref Range Status   Specimen Description BLOOD RIGHT HAND  Final   Special Requests   Final    BOTTLES DRAWN AEROBIC ONLY Blood Culture adequate volume   Culture   Final    NO  GROWTH 2 DAYS Performed at [facility], [address] [place], [place] [address]    Report Status PENDING  Incomplete     Radiology Studies: CT Angio Chest PE W/Cm &/Or Wo Cm  Result Date: 11/22/2021 CLINICAL DATA:  High probability for pulmonary embolism. Shortness of breath with pain and vomiting. History of bladder cancer. EXAM: CT ANGIOGRAPHY CHEST WITH CONTRAST TECHNIQUE: Multidetector CT imaging of the chest was performed using the standard protocol during bolus administration of intravenous contrast. Multiplanar CT image reconstructions and MIPs were obtained to evaluate the vascular anatomy. RADIATION DOSE REDUCTION: This exam was performed according to the departmental dose-optimization program which includes automated exposure control, adjustment of the mA and/or kV according to patient size and/or use of iterative reconstruction technique. CONTRAST:  53mL OMNIPAQUE IOHEXOL 350 MG/ML SOLN COMPARISON:  CT chest 05/21/2021 FINDINGS: Cardiovascular: Satisfactory opacification of the pulmonary arteries to the segmental level. No evidence of pulmonary embolism. Normal heart size. No pericardial effusion. There are atherosclerotic calcifications of the aorta. Mediastinum/Nodes: There is an enlarged precarinal lymph node measuring 11 mm short axis. No other enlarged lymph nodes are identified. Thyroid gland is not visualized on this study. Esophagus is nondilated. Lungs/Pleura: Moderate size loculated left pleural effusion is again seen similar in size, there are new air-fluid levels throughout this effusion. There is diffuse left pleural thickening which appears similar to prior. Small right pleural effusion appears similar to the prior study, mildly loculated. Emphysematous changes are again seen. There is compressive atelectasis of the bilateral lower lobes similar to the prior study. Spiculated nodular density in the left lung apex measuring 1.4 by 1.0 cm image 6/25 appears unchanged from the prior  study. Additional anterior left upper lobe nodule measuring 13 mm appears slightly smaller. Pleural-based and fissural based nodules throughout the right lung are unchanged. Trachea and central airways are patent. Additional smaller nodular densities in the left upper lobe measuring up to 6 mm appear unchanged. Upper Abdomen: Nodularity of the left adrenal unchanged. Musculoskeletal: There is left paraspinal muscular thickening measuring up to 2.6 cm in thickness axial image 4/113. This is incompletely imaged and appears similar to prior. No acute fractures are seen. Review of the MIP images confirms the above findings. IMPRESSION: 1. No pulmonary embolism identified. 2. Moderate-sized loculated left pleural effusion now contains multiple air-fluid levels. Findings are concerning for empyema. 3. Stable loculated right pleural effusion. 4. Single left upper lobe nodule has mildly decreased in size. Nodular densities are otherwise stable in both lungs with dominant  spiculated nodule in the left lung apex. 5. Stable left paraspinous soft tissue thickening worrisome for metastatic disease. 6. Stable enlarged precarinal lymph node. Electronically Signed   By: Ronney Asters M.D.   On: 11/06/2021 19:16   DG Chest 2 View  Result Date: 11/03/2021 CLINICAL DATA:  Back and hip pain. EXAM: CHEST - 2 VIEW COMPARISON:  10/29/2021 FINDINGS: The cardiopericardial silhouette is within normal limits for size. Chronic left effusion noted with multiple air-fluid levels. Small right pleural effusion evident. Interstitial markings are diffusely coarsened with chronic features. Bones are diffusely demineralized. IMPRESSION: 1. Chronic left effusion with multiple air-fluid levels. Patient had a left chest tube previously. Superinfection of the loculated left pleural fluid cannot be excluded. 2. Small right pleural effusion. Electronically Signed   By: Misty Stanley M.D.   On: 11/24/2021 17:02   DG Lumbar Spine 2-3 Views  Result  Date: 11/09/2021 CLINICAL DATA:  Back pain. EXAM: LUMBAR SPINE - 2-3 VIEW COMPARISON:  None Available. FINDINGS: No evidence for an acute fracture. No subluxation. Loss of disc height noted L2-3 and L3-4. No worrisome lytic or sclerotic osseous abnormality. IMPRESSION: Degenerative changes without acute bony findings. Electronically Signed   By: Misty Stanley M.D.   On: 11/03/2021 17:00    Scheduled Meds:  aspirin EC  81 mg Oral Daily   famotidine  40 mg Oral Daily   feeding supplement  237 mL Oral BID BM   gabapentin  300 mg Oral QHS   guaiFENesin  600 mg Oral BID   levothyroxine  75 mcg Oral Daily   lisinopril  10 mg Oral Daily   simvastatin  20 mg Oral QHS   Continuous Infusions:  ceFEPime (MAXIPIME) IV 2 g (11/07/21 1112)   metronidazole       LOS: 2 days   Time spent: 35 mins  Abiola Behring Wynetta Emery, MD How to contact the [facility] Attending or Consulting provider [place] or covering provider during after hours [place], for this patient?  Check the care team in [facility] and look for a) attending/consulting TRH provider listed and b) the [facility] team listed Log into [email or web address] and use [place]'s universal password to access. If you do not have the password, please contact the hospital operator. Locate the [facility] provider you are looking for under [facility] and page to a number that you can be directly reached. If you still have difficulty reaching the provider, please page the [facility] (Director on Call) for the Hospitalists listed on amion for assistance.  11/07/2021, 1:40 PM

## 2021-11-08 ENCOUNTER — Inpatient Hospital Stay (HOSPITAL_COMMUNITY): Payer: PPO

## 2021-11-08 DIAGNOSIS — I10 Essential (primary) hypertension: Secondary | ICD-10-CM | POA: Diagnosis not present

## 2021-11-08 DIAGNOSIS — J9601 Acute respiratory failure with hypoxia: Secondary | ICD-10-CM | POA: Diagnosis not present

## 2021-11-08 DIAGNOSIS — J869 Pyothorax without fistula: Secondary | ICD-10-CM | POA: Diagnosis not present

## 2021-11-08 DIAGNOSIS — K219 Gastro-esophageal reflux disease without esophagitis: Secondary | ICD-10-CM | POA: Diagnosis not present

## 2021-11-08 LAB — CBC
HCT: 31 % — ABNORMAL LOW (ref 36.0–46.0)
Hemoglobin: 10.1 g/dL — ABNORMAL LOW (ref 12.0–15.0)
MCH: 30.5 pg (ref 26.0–34.0)
MCHC: 32.6 g/dL (ref 30.0–36.0)
MCV: 93.7 fL (ref 80.0–100.0)
Platelets: 255 10*3/uL (ref 150–400)
RBC: 3.31 MIL/uL — ABNORMAL LOW (ref 3.87–5.11)
RDW: 14.5 % (ref 11.5–15.5)
WBC: 12.2 10*3/uL — ABNORMAL HIGH (ref 4.0–10.5)
nRBC: 0 % (ref 0.0–0.2)

## 2021-11-08 LAB — BASIC METABOLIC PANEL
Anion gap: 15 (ref 5–15)
BUN: 22 mg/dL (ref 8–23)
CO2: 21 mmol/L — ABNORMAL LOW (ref 22–32)
Calcium: 8.5 mg/dL — ABNORMAL LOW (ref 8.9–10.3)
Chloride: 97 mmol/L — ABNORMAL LOW (ref 98–111)
Creatinine, Ser: 0.61 mg/dL (ref 0.44–1.00)
GFR, Estimated: >60 mL/min (ref >60)
Glucose, Bld: 126 mg/dL — ABNORMAL HIGH (ref 70–99)
Potassium: 4 mmol/L (ref 3.5–5.1)
Sodium: 133 mmol/L — ABNORMAL LOW (ref 135–145)

## 2021-11-08 LAB — AMYLASE, BODY FLUID (OTHER): Amylase, Body Fluid: 11 U/L

## 2021-11-08 LAB — ALBUMIN: Albumin: 2 g/dL — ABNORMAL LOW (ref 3.5–5.0)

## 2021-11-08 LAB — TRIGLYCERIDES, BODY FLUIDS: Triglycerides, Fluid: 27 mg/dL

## 2021-11-08 LAB — PROTIME-INR
INR: 1.1 (ref 0.8–1.2)
Prothrombin Time: 14.4 seconds (ref 11.4–15.2)

## 2021-11-08 MED ORDER — SODIUM CHLORIDE (PF) 0.9 % IJ SOLN
10.0000 mg | Freq: Once | INTRAMUSCULAR | Status: AC
  Administered 2021-11-08: 10 mg via INTRAPLEURAL
  Filled 2021-11-08: qty 10

## 2021-11-08 MED ORDER — STERILE WATER FOR INJECTION IJ SOLN
5.0000 mg | Freq: Once | INTRAMUSCULAR | Status: AC
Start: 2021-11-08 — End: 2021-11-08
  Administered 2021-11-08: 5 mg via INTRAPLEURAL
  Filled 2021-11-08: qty 5

## 2021-11-08 MED ORDER — SODIUM CHLORIDE 0.9% FLUSH
10.0000 mL | Freq: Three times a day (TID) | INTRAVENOUS | Status: DC
Start: 2021-11-08 — End: 2021-11-10
  Administered 2021-11-08 – 2021-11-10 (×6): 10 mL

## 2021-11-08 MED ORDER — LIDOCAINE HCL (CARDIAC) PF 100 MG/5ML IV SOSY
PREFILLED_SYRINGE | INTRAVENOUS | Status: AC
  Filled 2021-11-08: qty 5

## 2021-11-08 MED ORDER — SODIUM CHLORIDE 0.9 % IV SOLN
12.5000 mg | Freq: Once | INTRAVENOUS | Status: AC | PRN
  Administered 2021-11-08: 12.5 mg via INTRAVENOUS
  Filled 2021-11-08: qty 0.5

## 2021-11-08 NOTE — Progress Notes (Addendum)
PROGRESS NOTE        PATIENT DETAILS Name: Rhonda Cook Age: 76 y.o. Sex: female Date of Birth: 1945-12-24 Admit Date: 11/18/2021 Admitting Physician Rolla Plate, DO PYP:PJKD, [facility]  Brief Summary: Patient is a 76 y.o.  female who was recently hospitalized for malignant pleural effusion requiring chest tube placement (tube removed on 7/1)-presented to [facility] with shortness of breath-found to have worsening of pleural effusion-and subsequently transferred to [facility] for further evaluation and treatment.    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 7/06>> evaluated by oncologist-Dr. Julien Nordmann in the outpatient setting.  PET scan/MRI brain ordered. 7/8>> admit to [facility] for SOB-CT with findings concerning for empyema. 7/10>> s/p thoracocentesis by IR and transfer to [facility]  Significant studies: 7/8>> CT angio chest: No PE-moderate loculated left pleural effusion with new air-fluid levels: Right pleural effusion.  Significant microbiology data: 7/8>> blood culture: Negative 7/8>> pleural fluid culture: No growth  Procedures: 6/22>> ultrasound-guided thoracocentesis 6/27-7/1>> chest tube placement/removal. 6/30>> bronchoscopy with EBUS 7/10>> thoracocentesis by IR (LDH 1117, WBCs 104-50% neutrophils)  Consults: PCCM  Subjective: Stable on 2-3 L of oxygen-comfortable at rest.  But acknowledges shortness of breath with minimal movement.  Objective: Vitals: Blood pressure 111/62, pulse 96, temperature 98.2 F (36.8 C), temperature source Oral, resp. rate 19, height 5\' 3"  (1.6 m), weight 52.6 kg, SpO2 95 %.   Exam: Gen Exam:Alert awake-not in any distress HEENT:atraumatic, normocephalic Chest: B/L clear to auscultation anteriorly CVS:S1S2 regular Abdomen:soft non tender, non distended Extremities:no edema Neurology: Non  focal Skin: no rash  Pertinent Labs/Radiology:    Latest Ref Rng & Units 11/08/2021    3:07 AM 11/06/2021    7:16 AM 11/04/2021    4:07 PM  CBC  WBC 4.0 - 10.5 K/uL 12.2  12.5  11.8   Hemoglobin 12.0 - 15.0 g/dL 10.1  11.1  11.9   Hematocrit 36.0 - 46.0 % 31.0  33.1  35.4   Platelets 150 - 400 K/uL 255  253  284     Lab Results  Component Value Date   NA 133 (L) 11/08/2021   K 4.0 11/08/2021   CL 97 (L) 11/08/2021   CO2 21 (L) 11/08/2021      Assessment/Plan: Acute hypoxic respiratory failure: Due to possible parapneumonic effusion/empyema superimposed on malignant pleural effusion.  Stable on 2-3 L of oxygen-appears comfortable-PCCM planning chest tube placement and initiation of lytic therapy.  Will await further recommendations.  Parapneumonic effusion/empyema: Based off CT imaging-however pleural fluid cultures negative so far-we will continue empiric antibiotics for now.  And follow clinically.  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 Lolita Lenz PET scan/MRI brain has been ordered.  HTN: BP stable-continue lisinopril.  Watch renal function closely-had minimally elevated creatinine but doubt AKI (ruled out)  HLD: Continue statin  Hypothyroidism: Continue Synthroid  GERD: Continue Pepcid  History of bladder cancer-s/p chemotherapy: Followed by urologist-Dr. Gilford Rile.  Nutrition Status: Nutrition Problem: Severe Malnutrition Etiology: cancer and cancer related treatments Signs/Symptoms: moderate fat depletion, severe fat depletion, moderate muscle depletion, severe muscle depletion, mild muscle depletion, mild fat depletion Interventions: Ensure Enlive (each supplement provides 350kcal and 20 grams of protein), Magic cup, MVI   BMI: Estimated body mass index is  20.55 kg/m as calculated from the following:   Height as of this encounter: 5\' 3"  (1.6 m).   Weight as of  this encounter: 52.6 kg.   Code status:   Code Status: DNR   DVT Prophylaxis: SCDs Start: 11/02/2021 2130   Family Communication:  daughter-in-law at bedside   Disposition Plan: Status is: Inpatient Remains inpatient appropriate because: Malignant pleural effusion-causing hypoxemia-chest tube planned soon-we will require several more days of hospitalization.   Planned Discharge Destination:Home health   Diet: Diet Order             Diet regular Room service appropriate? Yes; Fluid consistency: Thin  Diet effective now                     Antimicrobial agents: Anti-infectives (From admission, onward)    Start     Dose/Rate Route Frequency Ordered Stop   11/07/21 1300  metroNIDAZOLE (FLAGYL) IVPB 500 mg        500 mg 100 mL/hr over 60 Minutes Intravenous Every 12 hours 11/07/21 1224     11/06/21 2200  vancomycin (VANCOREADY) IVPB 750 mg/150 mL  Status:  Discontinued        750 mg 150 mL/hr over 60 Minutes Intravenous Every 24 hours 11/22/2021 2103 11/07/21 1019   11/06/21 1200  ceFEPIme (MAXIPIME) 2 g in sodium chloride 0.9 % 100 mL IVPB        2 g 200 mL/hr over 30 Minutes Intravenous Every 12 hours 11/06/21 0851     11/06/21 0600  aztreonam (AZACTAM) 1 g in sodium chloride 0.9 % 100 mL IVPB  Status:  Discontinued        1 g 200 mL/hr over 30 Minutes Intravenous Every 8 hours 11/23/2021 2107 11/06/21 0823   11/06/2021 2045  vancomycin (VANCOCIN) IVPB 1000 mg/200 mL premix        1,000 mg 200 mL/hr over 60 Minutes Intravenous  Once 11/08/2021 2031 11/06/21 0021   11/21/2021 2030  aztreonam (AZACTAM) 2 g in sodium chloride 0.9 % 100 mL IVPB        2 g 200 mL/hr over 30 Minutes Intravenous  Once 11/04/2021 2026 11/09/2021 2131        MEDICATIONS: Scheduled Meds:  aspirin EC  81 mg Oral Daily   famotidine  40 mg Oral Daily   feeding supplement  237 mL Oral BID BM   gabapentin  300 mg Oral QHS   guaiFENesin  600 mg Oral BID   levothyroxine  75 mcg Oral Daily   simvastatin   20 mg Oral QHS   sodium chloride flush  10 mL Other Q8H   Continuous Infusions:  ceFEPime (MAXIPIME) IV 2 g (11/08/21 0006)   metronidazole Stopped (11/08/21 1025)   promethazine (PHENERGAN) injection (IM or IVPB)     PRN Meds:.acetaminophen **OR** acetaminophen, albuterol, chlorpheniramine-HYDROcodone, HYDROcodone-acetaminophen, HYDROmorphone (DILAUDID) injection, ondansetron **OR** ondansetron (ZOFRAN) IV, polyethylene glycol, promethazine (PHENERGAN) injection (IM or IVPB), tetrahydrozoline   I have personally reviewed following labs and imaging studies  LABORATORY DATA: CBC: Recent Labs  Lab 11/03/21 0839 11/13/2021 1607 11/06/21 0716 11/08/21 0307  WBC 21.8* 11.8* 12.5* 12.2*  NEUTROABS 19.7* 10.3* 11.7*  --   HGB 13.4 11.9* 11.1* 10.1*  HCT 39.1 35.4* 33.1* 31.0*  MCV 89.9 92.4 93.0 93.7  PLT 390 284 253 409    Basic Metabolic Panel: Recent Labs  Lab 11/03/21 0839 11/04/2021 1607 11/06/21 0716 11/07/21 0559 11/08/21 0307  NA 132* 131* 132* 134* 133*  K 3.8 3.7 3.2* 3.8 4.0  CL 94* 93* 99 103 97*  CO2 27 25 22 24  21*  GLUCOSE 114* 96 93 112* 126*  BUN 18 27* 19 22 22   CREATININE 0.75 1.09* 0.64 0.58 0.61  CALCIUM 9.4 8.6* 8.1* 8.2* 8.5*  MG  --   --  1.9 1.9  --     GFR: Estimated Creatinine Clearance: 50.3 mL/min (by C-G formula based on SCr of 0.61 mg/dL).  Liver Function Tests: Recent Labs  Lab 11/03/21 0839 11/28/2021 1607 11/06/21 0716 11/08/21 0307  AST 21 16 13*  --   ALT 28 20 18   --   ALKPHOS 87 84 72  --   BILITOT 0.6 0.6 0.7  --   PROT 6.5 5.8* 5.2*  --   ALBUMIN 3.5 2.7* 2.3* 2.0*   No results for input(s): "LIPASE", "AMYLASE" in the last 168 hours. No results for input(s): "AMMONIA" in the last 168 hours.  Coagulation Profile: Recent Labs  Lab 11/08/21 0909  INR 1.1    Cardiac Enzymes: No results for input(s): "CKTOTAL", "CKMB", "CKMBINDEX", "TROPONINI" in the last 168 hours.  BNP (last 3 results) No results for input(s):  "PROBNP" in the last 8760 hours.  Lipid Profile: No results for input(s): "CHOL", "HDL", "LDLCALC", "TRIG", "CHOLHDL", "LDLDIRECT" in the last 72 hours.  Thyroid Function Tests: No results for input(s): "TSH", "T4TOTAL", "FREET4", "T3FREE", "THYROIDAB" in the last 72 hours.  Anemia Panel: No results for input(s): "VITAMINB12", "FOLATE", "FERRITIN", "TIBC", "IRON", "RETICCTPCT" in the last 72 hours.  Urine analysis:    Component Value Date/Time   COLORURINE STRAW (A) 10/19/2021 1825   APPEARANCEUR CLEAR 10/19/2021 1825   LABSPEC 1.043 (H) 10/19/2021 1825   PHURINE 7.0 10/19/2021 1825   GLUCOSEU NEGATIVE 10/19/2021 1825   HGBUR NEGATIVE 10/19/2021 1825   BILIRUBINUR NEGATIVE 10/19/2021 1825   KETONESUR 20 (A) 10/19/2021 1825   PROTEINUR NEGATIVE 10/19/2021 1825   NITRITE NEGATIVE 10/19/2021 1825   LEUKOCYTESUR NEGATIVE 10/19/2021 1825    Sepsis Labs: Lactic Acid, Venous    Component Value Date/Time   LATICACIDVEN 1.2 11/26/2021 1806    MICROBIOLOGY: Recent Results (from the past 240 hour(s))  MRSA Next Gen by PCR, Nasal     Status: None   Collection Time: 11/17/2021  9:01 PM   Specimen: Nasal Mucosa; Nasal Swab  Result Value Ref Range Status   MRSA by PCR Next Gen NOT DETECTED NOT DETECTED Final    Comment: (NOTE) The GeneXpert MRSA Assay (FDA approved for NASAL specimens only), is one component of a comprehensive MRSA colonization surveillance program. It is not intended to diagnose MRSA infection nor to guide or monitor treatment for MRSA infections. Test performance is not FDA approved in patients less than 36 years old. Performed at [facility], [address] [place], [place] [address]   Culture, blood (Routine X 2) w Reflex to ID Panel     Status: None (Preliminary result)   Collection Time: 11/20/2021  9:41 PM   Specimen: BLOOD LEFT HAND  Result Value Ref Range Status   Specimen Description BLOOD LEFT HAND  Final   Special Requests   Final    BOTTLES DRAWN  AEROBIC ONLY Blood Culture adequate volume   Culture   Final    NO GROWTH 3 DAYS Performed at [facility], [address] [place], [place] [address]    Report Status PENDING  Incomplete  Culture, blood (Routine X 2) w Reflex to ID Panel  Status: None (Preliminary result)   Collection Time: 11/10/2021  9:50 PM   Specimen: BLOOD RIGHT HAND  Result Value Ref Range Status   Specimen Description BLOOD RIGHT HAND  Final   Special Requests   Final    BOTTLES DRAWN AEROBIC ONLY Blood Culture adequate volume   Culture   Final    NO GROWTH 3 DAYS Performed at [facility], [address] [place], [place] [address]    Report Status PENDING  Incomplete  Culture, body fluid w Gram Stain-bottle     Status: None (Preliminary result)   Collection Time: 11/07/21  1:06 PM   Specimen: Pleura  Result Value Ref Range Status   Specimen Description PLEURAL  Final   Special Requests   Final    Blood Culture adequate volume BOTTLES DRAWN AEROBIC AND ANAEROBIC   Culture   Final    NO GROWTH < 24 HOURS Performed at [facility], [address], [place], [place] [address]    Report Status PENDING  Incomplete  Gram stain     Status: None   Collection Time: 11/07/21  1:06 PM   Specimen: Pleura  Result Value Ref Range Status   Specimen Description PLEURAL  Final   Special Requests NONE  Final   Gram Stain   Final    NO ORGANISMS SEEN WBC PRESENT, PREDOMINANTLY MONONUCLEAR CYTOSPIN SMEAR Performed at [facility], [address] [place], [place] [address]    Report Status 11/07/2021 FINAL  Final    RADIOLOGY STUDIES/RESULTS: DG Chest Port 1 View  Result Date: 11/07/2021 CLINICAL DATA:  Follow-up LEFT pneumothorax post thoracentesis EXAM: PORTABLE CHEST 1 VIEW COMPARISON:  Portable exam 1659 hours compared to earlier study of 1322 hours FINDINGS: Normal heart size mediastinal contours with atherosclerotic calcification aorta. Accentuation of interstitial markings unchanged with bibasilar pleural  effusions and atelectasis. Small loculated pneumothorax at lateral inferior LEFT chest unchanged. No mediastinal shift. Bones demineralized. IMPRESSION: Persistent/stable loculated LEFT basilar hydropneumothorax. Small RIGHT pleural effusion with bibasilar atelectasis and persistent accentuation of interstitial markings. Electronically Signed   By: Lavonia Dana M.D.   On: 11/07/2021 17:13   DG Chest 1 View  Result Date: 11/07/2021 CLINICAL DATA:  Post LEFT thoracentesis EXAM: CHEST  1 VIEW COMPARISON:  11/06/2021 FINDINGS: Small loculated LEFT basilar pneumothorax post thoracentesis. Bibasilar pleural effusions and atelectasis remain. Stable heart size and mediastinal contours with atherosclerotic calcification aorta. No osseous abnormalities. IMPRESSION: Small loculated hydropneumothorax at LEFT base post thoracentesis. Patient asymptomatic, reports breathing improved since procedure. Follow-up chest radiograph ordered. Electronically Signed   By: Lavonia Dana M.D.   On: 11/07/2021 14:04   US THORACENTESIS ASP PLEURAL SPACE W/IMG GUIDE  Result Date: 11/07/2021 Lavonia Dana, MD     11/07/2021  2:00 PM PreOperative Dx: Recurrent LEFT pleural effusion Postoperative Dx: Recurrent LEFT pleural effusion Procedure:   US guided LEFt thoracentesis Radiologist:  Thornton Papas Anesthesia:  10 ml of 1% lidocaine Specimen:  270 mL of amber colored fluid EBL:   < 1 ml Complications: None      LOS: 3 days   Oren Binet, MD  [facility]    To contact the attending provider between 7A-7P or the covering provider during after hours 7P-7A, please log into the web site [email or web address] and access using universal [place] password for that web site. If you do not have the password, please call the hospital operator.  11/08/2021, 10:52 AM

## 2021-11-08 NOTE — Progress Notes (Signed)
NAME:  Rhonda Cook, MRN:  [id number], DOB:  [birth year], LOS: 3 ADMISSION DATE:  11/19/2021, CONSULTATION DATE:  11/08/21 REFERRING MD: [facility] CHIEF COMPLAINT:  Pleural EFfusion  History of Present Illness:  76 yo female smoker was in hospital in June 2023 with dyspnea and cough from large bilateral pleural effusions.  Effusions were exudate with lymphocyte predominance and atypical cells concerning for malignancy.  She had pleural catheter placed by IR on 10/25/21.  She had bronchoscopy with Dr. Valeta Harms on 10/28/21 to assess lung nodules and mediastinal adenopathy, with needle biopsy of 4R lymph node which was positive for NSCLC (squamous cell carcinoma).  She had chest tube removed on 10/29/21.  She had appointment with Dr. 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  She was to have MRI brain and PET scan for staging work up.  She presented to [facility] on [facility]/21 for dyspnea, back pain and vomiting.  Was having sweats and chest pain also.  Pertinent  Medical History  Osteoarthritis, Autoimmune thyroiditis with hypothyroidism, Bladder cancer s/p surgical excision, COPD, HTN, HLD  Significant Hospital Events: Including procedures, antibiotic start and stop dates in addition to other pertinent events   CT angio chest 7/08 >> moderate loculated Lt effusion with new air-fluid levels throughout, Lt pleural thickening, changes of emphysema, smaller Rt pleural effusion 1.4 x 1 cm spiculated nodule Lt apex, 13 mm nodule LUL, pleural based and fissural nodules throughout Rt lung Lt thoracentesis 7/10 >> 270 ml amber fluid, glucose less than 20, LDH 1117, WBC 104 (50% N, 30% L, 20% M)  - 7/11 transferred to [facility]  Interim History / Subjective:   Patient's pain is better controlled at this time. She has nausea. Her breathing improved some after thoracentesis. She has been transferred to [facility] over night.  Objective   Blood pressure (!) 111/54, pulse 100, temperature 98.2 F  (36.8 C), temperature source Oral, resp. rate 12, height 5\' 3"  (1.6 m), weight 52.6 kg, SpO2 99 %.        Intake/Output Summary (Last 24 hours) at 11/08/2021 1345 Last data filed at 11/08/2021 1025 Gross per 24 hour  Intake 640 ml  Output 300 ml  Net 340 ml   Filed Weights   11/18/2021 1443  Weight: 52.6 kg   Examination: General: ill appearing, mild distress, thin HENT: [place]/AT, moist mucous membranes, sclera anicteric Lungs: diminished breath sounds, no wheezing Cardiovascular: rrr, no murmurs Abdomen: soft, non-tender, non-distended, BS+ Extremities: warm, no edema Neuro: a&o x 3, moving all extremities GU: n/a  Resolved Hospital Problem list     Assessment & Plan:  Exudate Lt pleural effusion with loculation. - findings are concerning for infection in setting of malignant effusion - continue Abx - chest tube placed today and first dose of pleural lytic therapy administered - Will follow daily x-ray - chest tube placed to -20 cmH2O suction   Stage IV NSCLC (squamous cell). - followed by Dr. Julien Nordmann as outpt - recommend obtaining MRI brain while in patient - Awaiting outpatient PET scan  Hypothyroidism. GERD. HLD. HTN. Moderate protein calorie malnutrition. - per primary team  Best Practice (right click and "Reselect all SmartList Selections" daily)   Per Primary  Labs   CBC: Recent Labs  Lab 11/03/21 0839 11/07/2021 1607 11/06/21 0716 11/08/21 0307  WBC 21.8* 11.8* 12.5* 12.2*  NEUTROABS 19.7* 10.3* 11.7*  --   HGB 13.4 11.9* 11.1* 10.1*  HCT 39.1 35.4* 33.1* 31.0*  MCV 89.9 92.4  93.0 93.7  PLT 390 284 253 740    Basic Metabolic Panel: Recent Labs  Lab 11/03/21 0839 11/13/2021 1607 11/06/21 0716 11/07/21 0559 11/08/21 0307  NA 132* 131* 132* 134* 133*  K 3.8 3.7 3.2* 3.8 4.0  CL 94* 93* 99 103 97*  CO2 27 25 22 24  21*  GLUCOSE 114* 96 93 112* 126*  BUN 18 27* 19 22 22   CREATININE 0.75 1.09* 0.64 0.58 0.61  CALCIUM 9.4 8.6* 8.1* 8.2* 8.5*   MG  --   --  1.9 1.9  --    GFR: Estimated Creatinine Clearance: 50.3 mL/min (by C-G formula based on SCr of 0.61 mg/dL). Recent Labs  Lab 11/03/21 0839 11/24/2021 1607 11/16/2021 1806 11/06/21 0716 11/08/21 0307  WBC 21.8* 11.8*  --  12.5* 12.2*  LATICACIDVEN  --  1.4 1.2  --   --     Liver Function Tests: Recent Labs  Lab 11/03/21 0839 10/29/2021 1607 11/06/21 0716 11/08/21 0307  AST 21 16 13*  --   ALT 28 20 18   --   ALKPHOS 87 84 72  --   BILITOT 0.6 0.6 0.7  --   PROT 6.5 5.8* 5.2*  --   ALBUMIN 3.5 2.7* 2.3* 2.0*   No results for input(s): "LIPASE", "AMYLASE" in the last 168 hours. No results for input(s): "AMMONIA" in the last 168 hours.  ABG    Component Value Date/Time   TCO2 26 09/16/2019 1015     Coagulation Profile: Recent Labs  Lab 11/08/21 0909  INR 1.1    Cardiac Enzymes: No results for input(s): "CKTOTAL", "CKMB", "CKMBINDEX", "TROPONINI" in the last 168 hours.  HbA1C: No results found for: "HGBA1C"  CBG: No results for input(s): "GLUCAP" in the last 168 hours.    Critical care time: n/a    Freda Jackson, MD [facility] Office: [phone]   See Amion for personal pager PCCM on call pager [phone] until 7pm. Please call Elink 7p-7a. [phone]

## 2021-11-08 NOTE — Progress Notes (Signed)
Pharmacy Antibiotic Note  CHEVETTE FEE is a 76 y.o. female admitted on 11/17/2021 with acute hypoxic respiratory failure due to possible parapneumonic effusion/empyema superimposed on malignant pleural effusion.  Pharmacy was consulted 11/06/21 for Cefepime dosing for HCAP (recently discharged from hospital 10/19/21-7/2/2). Started on Cefepime 2g IV q12hr.   Plan: Continue Cefepime 2g IV q12hr. Monitor clinical status, renal function and culture results daily.    Height: 5\' 3"  (160 cm) Weight: 52.6 kg (116 lb) IBW/kg (Calculated) : 52.4  Temp (24hrs), Avg:98 F (36.7 C), Min:97.8 F (36.6 C), Max:98.2 F (36.8 C)  Recent Labs  Lab 11/03/21 0839 11/10/2021 1607 11/10/2021 1806 11/06/21 0716 11/07/21 0559 11/08/21 0307  WBC 21.8* 11.8*  --  12.5*  --  12.2*  CREATININE 0.75 1.09*  --  0.64 0.58 0.61  LATICACIDVEN  --  1.4 1.2  --   --   --     Estimated Creatinine Clearance: 50.3 mL/min (by C-G formula based on SCr of 0.61 mg/dL).    Allergies  Allergen Reactions   Oxycodone Itching and Nausea And Vomiting   Adhesive [Tape] Rash   Penicillins Nausea And Vomiting and Rash    Did it involve swelling of the face/tongue/throat, SOB, or low BP? no Did it involve sudden or severe rash/hives, skin peeling, or any reaction on the inside of your mouth or nose? yes Did you need to seek medical attention at a hospital or doctor's office? no When did it last happen?  20 years ago     If all above answers are "NO", may proceed with cephalosporin use.   Wound Dressing Adhesive Rash    Antimicrobials this admission:  Flagyl 7/10 >> Cefepime 7/9 >> Vanco 7/8 >>7/9 Aztreonam 7/8   Dose adjustments this admission:   Microbiology results: 7/8 MRSA PCR: neg 7/8 Bcx: ngtd x3d 7/8 Bcx: ngtd x3d 7/10 pleural fluid cx: sent     Thank you for allowing pharmacy to be a part of this patient's care.  Nicole Cella, RPh Clinical Pharmacist 11/08/2021 2:31 PM

## 2021-11-08 NOTE — Progress Notes (Signed)
Dr. Tamala Julian performed Left chest tube insertion at bedside. Consent obtained and all bedside procedure check-list followed and met.  Pre and post vital signs within normal limits. Patient tolerated procedure well. Bedside RN made aware and at the bedside.

## 2021-11-08 NOTE — Procedures (Signed)
Pleural Fibrinolytic Administration Procedure Note  BAYLIN CABAL  [id number]  07-21-1945  Date:11/08/21  Time:1:38 PM   Provider Performing:Ehan Freas C Tamala Julian   Procedure: Pleural Fibrinolysis Initial day [phone])  Indication(s) Fibrinolysis of complicated pleural effusion  Consent Risks of the procedure as well as the alternatives and risks of each were explained to the patient and/or caregiver.  Consent for the procedure was obtained.   Anesthesia None   Time Out Verified patient identification, verified procedure, site/side was marked, verified correct patient position, special equipment/implants available, medications/allergies/relevant history reviewed, required imaging and test results available.   Sterile Technique Hand hygiene, gloves   Procedure Description Existing pleural catheter was cleaned and accessed in sterile manner.  10mg  of tPA in 30cc of saline and 5mg  of dornase in 30cc of sterile water were injected into pleural space using existing pleural catheter.  Catheter will be clamped for 1 hour and then placed back to suction.   Complications/Tolerance None; patient tolerated the procedure well.  EBL None   Specimen(s) None

## 2021-11-08 NOTE — Progress Notes (Signed)
Paged Dr. Marlowe Sax for additional antiemetic.

## 2021-11-08 NOTE — Progress Notes (Signed)
Pt left via [facility] to [place]. Report has been called on dayshift. Pt medicated with dilaudid and zofran prior to transfer.

## 2021-11-08 NOTE — Procedures (Addendum)
Insertion of Chest Tube Procedure Note  Rhonda Cook  [id number]  [birth year]  Date:11/08/21  Time:12:37 PM    Provider Performing: Dr. Farrel Gordon under direction of Candee Furbish   Procedure: Pleural Catheter Insertion w/ Imaging Guidance [phone])  Indication(s) Effusion  Consent Risks of the procedure as well as the alternatives and risks of each were explained to the patient and/or caregiver.  Consent for the procedure was obtained and is signed in the bedside chart  Anesthesia Topical only with 1% lidocaine    Time Out Verified patient identification, verified procedure, site/side was marked, verified correct patient position, special equipment/implants available, medications/allergies/relevant history reviewed, required imaging and test results available.   Sterile Technique Maximal sterile technique including full sterile barrier drape, hand hygiene, sterile gown, sterile gloves, mask, hair covering, sterile ultrasound probe cover (if used).   Procedure Description Ultrasound used to identify appropriate pleural anatomy for placement and overlying skin marked. Area of placement cleaned and draped in sterile fashion.  A 14 French pigtail pleural catheter was placed into the left pleural space using Seldinger technique. Appropriate return of fluid was obtained.  The tube was connected to atrium and placed on -20 cm H2O wall suction.   Complications/Tolerance None; patient tolerated the procedure well. Chest X-ray is ordered to verify placement.   EBL Minimal  Specimen(s) none

## 2021-11-09 ENCOUNTER — Telehealth: Payer: Self-pay | Admitting: Medical Oncology

## 2021-11-09 ENCOUNTER — Inpatient Hospital Stay (HOSPITAL_COMMUNITY): Payer: PPO

## 2021-11-09 ENCOUNTER — Ambulatory Visit (HOSPITAL_COMMUNITY): Payer: PPO

## 2021-11-09 ENCOUNTER — Other Ambulatory Visit (HOSPITAL_COMMUNITY): Payer: PPO

## 2021-11-09 DIAGNOSIS — K219 Gastro-esophageal reflux disease without esophagitis: Secondary | ICD-10-CM | POA: Diagnosis not present

## 2021-11-09 DIAGNOSIS — J9601 Acute respiratory failure with hypoxia: Secondary | ICD-10-CM | POA: Diagnosis not present

## 2021-11-09 DIAGNOSIS — I10 Essential (primary) hypertension: Secondary | ICD-10-CM | POA: Diagnosis not present

## 2021-11-09 DIAGNOSIS — J869 Pyothorax without fistula: Secondary | ICD-10-CM | POA: Diagnosis not present

## 2021-11-09 LAB — CBC
HCT: 31.5 % — ABNORMAL LOW (ref 36.0–46.0)
HCT: 31 % — ABNORMAL LOW (ref 36.0–46.0)
HCT: 34.1 % — ABNORMAL LOW (ref 36.0–46.0)
Hemoglobin: 10.4 g/dL — ABNORMAL LOW (ref 12.0–15.0)
Hemoglobin: 10.5 g/dL — ABNORMAL LOW (ref 12.0–15.0)
Hemoglobin: 11.2 g/dL — ABNORMAL LOW (ref 12.0–15.0)
MCH: 30.6 pg (ref 26.0–34.0)
MCH: 30.8 pg (ref 26.0–34.0)
MCH: 30.8 pg (ref 26.0–34.0)
MCHC: 33 g/dL (ref 30.0–36.0)
MCHC: 33.9 g/dL (ref 30.0–36.0)
MCHC: 32.8 g/dL (ref 30.0–36.0)
MCV: 92.6 fL (ref 80.0–100.0)
MCV: 90.9 fL (ref 80.0–100.0)
MCV: 93.7 fL (ref 80.0–100.0)
Platelets: 273 10*3/uL (ref 150–400)
Platelets: 269 10*3/uL (ref 150–400)
Platelets: 350 10*3/uL (ref 150–400)
RBC: 3.4 MIL/uL — ABNORMAL LOW (ref 3.87–5.11)
RBC: 3.41 MIL/uL — ABNORMAL LOW (ref 3.87–5.11)
RBC: 3.64 MIL/uL — ABNORMAL LOW (ref 3.87–5.11)
RDW: 14.6 % (ref 11.5–15.5)
RDW: 14.6 % (ref 11.5–15.5)
RDW: 14.7 % (ref 11.5–15.5)
WBC: 10.7 10*3/uL — ABNORMAL HIGH (ref 4.0–10.5)
WBC: 10.7 10*3/uL — ABNORMAL HIGH (ref 4.0–10.5)
WBC: 14.1 10*3/uL — ABNORMAL HIGH (ref 4.0–10.5)
nRBC: 0 % (ref 0.0–0.2)
nRBC: 0 % (ref 0.0–0.2)
nRBC: 0 % (ref 0.0–0.2)

## 2021-11-09 LAB — COMPREHENSIVE METABOLIC PANEL
ALT: 14 U/L (ref 0–44)
AST: 13 U/L — ABNORMAL LOW (ref 15–41)
Albumin: 2 g/dL — ABNORMAL LOW (ref 3.5–5.0)
Alkaline Phosphatase: 60 U/L (ref 38–126)
Anion gap: 11 (ref 5–15)
BUN: 19 mg/dL (ref 8–23)
CO2: 21 mmol/L — ABNORMAL LOW (ref 22–32)
Calcium: 8.3 mg/dL — ABNORMAL LOW (ref 8.9–10.3)
Chloride: 100 mmol/L (ref 98–111)
Creatinine, Ser: 0.68 mg/dL (ref 0.44–1.00)
GFR, Estimated: >60 mL/min (ref >60)
Glucose, Bld: 86 mg/dL (ref 70–99)
Potassium: 4 mmol/L (ref 3.5–5.1)
Sodium: 132 mmol/L — ABNORMAL LOW (ref 135–145)
Total Bilirubin: 0.5 mg/dL (ref 0.3–1.2)
Total Protein: 4.8 g/dL — ABNORMAL LOW (ref 6.5–8.1)

## 2021-11-09 LAB — BODY FLUID CELL COUNT WITH DIFFERENTIAL
Eos, Fluid: 3 %
Lymphs, Fluid: 30 %
Monocyte-Macrophage-Serous Fluid: 6 % — ABNORMAL LOW (ref 50–90)
Neutrophil Count, Fluid: 61 % — ABNORMAL HIGH (ref 0–25)
Total Nucleated Cell Count, Fluid: 592 cu mm (ref 0–1000)

## 2021-11-09 LAB — AMYLASE, PLEURAL OR PERITONEAL FLUID: Amylase, Fluid: 6 U/L

## 2021-11-09 LAB — BASIC METABOLIC PANEL
Anion gap: 15 (ref 5–15)
BUN: 18 mg/dL (ref 8–23)
CO2: 16 mmol/L — ABNORMAL LOW (ref 22–32)
Calcium: 8.2 mg/dL — ABNORMAL LOW (ref 8.9–10.3)
Chloride: 101 mmol/L (ref 98–111)
Creatinine, Ser: 0.55 mg/dL (ref 0.44–1.00)
GFR, Estimated: >60 mL/min (ref >60)
Glucose, Bld: 92 mg/dL (ref 70–99)
Potassium: 4.3 mmol/L (ref 3.5–5.1)
Sodium: 132 mmol/L — ABNORMAL LOW (ref 135–145)

## 2021-11-09 LAB — PROTEIN, PLEURAL OR PERITONEAL FLUID: Total protein, fluid: <3 g/dL

## 2021-11-09 LAB — GLUCOSE, PLEURAL OR PERITONEAL FLUID: Glucose, Fluid: 85 mg/dL

## 2021-11-09 LAB — LACTATE DEHYDROGENASE, PLEURAL OR PERITONEAL FLUID: LD, Fluid: 467 U/L — ABNORMAL HIGH (ref 3–23)

## 2021-11-09 LAB — ALBUMIN, PLEURAL OR PERITONEAL FLUID: Albumin, Fluid: <1.5 g/dL

## 2021-11-09 MED ORDER — METOPROLOL TARTRATE 5 MG/5ML IV SOLN
5.0000 mg | INTRAVENOUS | Status: AC
  Administered 2021-11-09: 5 mg via INTRAVENOUS

## 2021-11-09 MED ORDER — METOPROLOL TARTRATE 5 MG/5ML IV SOLN
2.5000 mg | INTRAVENOUS | Status: AC
  Administered 2021-11-09: 2.5 mg via INTRAVENOUS

## 2021-11-09 MED ORDER — MIDODRINE HCL 5 MG PO TABS
10.0000 mg | ORAL_TABLET | Freq: Once | ORAL | Status: AC
  Administered 2021-11-10: 10 mg via ORAL
  Filled 2021-11-09: qty 2

## 2021-11-09 MED ORDER — METOPROLOL TARTRATE 5 MG/5ML IV SOLN
INTRAVENOUS | Status: AC
  Filled 2021-11-09: qty 5

## 2021-11-09 MED ORDER — GADOBUTROL 1 MMOL/ML IV SOLN
6.0000 mL | Freq: Once | INTRAVENOUS | Status: AC | PRN
  Administered 2021-11-09: 6 mL via INTRAVENOUS

## 2021-11-09 MED ORDER — PROCHLORPERAZINE EDISYLATE 10 MG/2ML IJ SOLN
5.0000 mg | Freq: Four times a day (QID) | INTRAMUSCULAR | Status: DC | PRN
  Administered 2021-11-09: 5 mg via INTRAVENOUS
  Filled 2021-11-09: qty 2

## 2021-11-09 MED ORDER — SODIUM CHLORIDE 0.9 % IV BOLUS
500.0000 mL | INTRAVENOUS | Status: AC
  Administered 2021-11-09: 500 mL via INTRAVENOUS

## 2021-11-09 MED ORDER — SODIUM CHLORIDE 0.9 % IV SOLN
6.2500 mg | Freq: Four times a day (QID) | INTRAVENOUS | Status: DC | PRN
  Administered 2021-11-09: 6.25 mg via INTRAVENOUS
  Filled 2021-11-09: qty 0.25

## 2021-11-09 MED ORDER — METOPROLOL TARTRATE 12.5 MG HALF TABLET
12.5000 mg | ORAL_TABLET | Freq: Four times a day (QID) | ORAL | Status: DC
  Administered 2021-11-09 – 2021-11-10 (×2): 12.5 mg via ORAL
  Filled 2021-11-09 (×2): qty 1

## 2021-11-09 NOTE — Progress Notes (Signed)
PROGRESS NOTE        PATIENT DETAILS Name: Rhonda Cook Age: 76 y.o. Sex: female Date of Birth: October 19, 1945 Admit Date: 11/19/2021 Admitting Physician Rolla Plate, DO HBZ:[facility], [facility]  Brief Summary: Patient is a 76 y.o.  female who was recently hospitalized for malignant pleural effusion requiring chest tube placement (tube removed on 7/1)-presented to [facility] with shortness of breath-found to have worsening of pleural effusion-and subsequently transferred to [facility] for further evaluation and treatment.    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 7/06>> evaluated by oncologist-Dr. Julien Nordmann in the outpatient setting.  PET scan/MRI brain ordered. 7/8>> admit to [facility] at [facility] for SOB-CT with findings concerning for empyema. 7/10>> s/p thoracocentesis by IR and transfer to [facility]  Significant studies: 7/8>> CT angio chest: No PE-moderate loculated left pleural effusion with new air-fluid levels: Right pleural effusion.  Significant microbiology data: 7/8>> blood culture: Negative 7/8>> pleural fluid culture: No growth 7/10>> pleural fluid culture: No growth 7/10>> pleural fluid AFB culture: Pending   Procedures: 6/22>> ultrasound-guided thoracocentesis 6/27-7/1>> chest tube placement/removal. 6/30>> bronchoscopy with EBUS 7/10>> thoracocentesis by IR (LDH 1117, WBCs 104-50% neutrophils) 7/11>> left chest tube placement by PCCM   Consults: PCCM  Subjective: Appears more comfortable this morning-left-sided chest tube in place.  Spouse at bedside.  Some intermittent nausea over the past 24 hours.  Objective: Vitals: Blood pressure 107/70, pulse (!) 101, temperature 98.5 F (36.9 C), temperature source Oral, resp. rate 18, height 5\' 3"  (1.6 m), weight 52.6 kg, SpO2 97 %.   Exam: Gen Exam:Alert awake-not in any  distress HEENT:atraumatic, normocephalic Chest: B/L clear to auscultation anteriorly CVS:S1S2 regular Abdomen:soft non tender, non distended Extremities:no edema Neurology: Non focal Skin: no rash   Pertinent Labs/Radiology:    Latest Ref Rng & Units 11/09/2021    6:36 AM 11/08/2021    3:07 AM 11/06/2021    7:16 AM  CBC  WBC 4.0 - 10.5 K/uL 10.7  12.2  12.5   Hemoglobin 12.0 - 15.0 g/dL 10.4  10.1  11.1   Hematocrit 36.0 - 46.0 % 31.5  31.0  33.1   Platelets 150 - 400 K/uL 273  255  253     Lab Results  Component Value Date   NA 132 (L) 11/09/2021   K 4.0 11/09/2021   CL 100 11/09/2021   CO2 21 (L) 11/09/2021      Assessment/Plan: Acute hypoxic respiratory failure: Due to pleural effusion-stable on 2 L of oxygen this morning-attempt to titrate off oxygen.   Parapneumonic effusion/empyema superimposed on malignant pleural effusion: Based off CT imaging-s/p left-sided chest tube insertion on 7/11 and 1 round of lytic therapy.  Remains on empiric antibiotics.  PCCM planning on right thoracocentesis today.  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 Dorothe Pea intermittent nausea-we will go ahead and get MRI brain done inpatient (was ordered for the outpatient setting) PET scan will have to be done in the outpatient setting.  HTN: BP stable-antihypertensives remain on hold.  Resume when able.   HLD: Continue statin  Hypothyroidism: Continue Synthroid  GERD: Continue Pepcid  History of bladder cancer-s/p chemotherapy: Followed by urologist-Dr. Gilford Rile.  Nutrition Status: Nutrition Problem: Severe Malnutrition Etiology: cancer and cancer related treatments Signs/Symptoms: moderate  fat depletion, severe fat depletion, moderate muscle depletion, severe muscle depletion, mild muscle depletion, mild fat depletion Interventions: Ensure Enlive (each supplement provides 350kcal and 20  grams of protein), Magic cup, MVI   BMI: Estimated body mass index is 20.55 kg/m as calculated from the following:   Height as of this encounter: 5\' 3"  (1.6 m).   Weight as of this encounter: 52.6 kg.   Code status:   Code Status: DNR   DVT Prophylaxis: SCDs Start: 11/25/2021 2130   Family Communication: Spouse at bedside.   Disposition Plan: Status is: Inpatient Remains inpatient appropriate because: Malignant pleural effusion-causing hypoxemia-chest tube planned soon-we will require several more days of hospitalization.   Planned Discharge Destination:Home health   Diet: Diet Order             Diet regular Room service appropriate? Yes; Fluid consistency: Thin  Diet effective now                     Antimicrobial agents: Anti-infectives (From admission, onward)    Start     Dose/Rate Route Frequency Ordered Stop   11/07/21 1300  metroNIDAZOLE (FLAGYL) IVPB 500 mg        500 mg 100 mL/hr over 60 Minutes Intravenous Every 12 hours 11/07/21 1224     11/06/21 2200  vancomycin (VANCOREADY) IVPB 750 mg/150 mL  Status:  Discontinued        750 mg 150 mL/hr over 60 Minutes Intravenous Every 24 hours 11/12/2021 2103 11/07/21 1019   11/06/21 1200  ceFEPIme (MAXIPIME) 2 g in sodium chloride 0.9 % 100 mL IVPB        2 g 200 mL/hr over 30 Minutes Intravenous Every 12 hours 11/06/21 0851     11/06/21 0600  aztreonam (AZACTAM) 1 g in sodium chloride 0.9 % 100 mL IVPB  Status:  Discontinued        1 g 200 mL/hr over 30 Minutes Intravenous Every 8 hours 11/04/2021 2107 11/06/21 0823   11/18/2021 2045  vancomycin (VANCOCIN) IVPB 1000 mg/200 mL premix        1,000 mg 200 mL/hr over 60 Minutes Intravenous  Once 11/04/2021 2031 11/06/21 0021   11/07/2021 2030  aztreonam (AZACTAM) 2 g in sodium chloride 0.9 % 100 mL IVPB        2 g 200 mL/hr over 30 Minutes Intravenous  Once 11/07/2021 2026 11/07/2021 2131        MEDICATIONS: Scheduled Meds:  aspirin EC  81 mg Oral Daily   famotidine   40 mg Oral Daily   feeding supplement  237 mL Oral BID BM   gabapentin  300 mg Oral QHS   guaiFENesin  600 mg Oral BID   levothyroxine  75 mcg Oral Daily   simvastatin  20 mg Oral QHS   sodium chloride flush  10 mL Other Q8H   Continuous Infusions:  ceFEPime (MAXIPIME) IV Stopped (11/09/21 0047)   metronidazole Stopped (11/09/21 1015)   PRN Meds:.acetaminophen **OR** acetaminophen, albuterol, chlorpheniramine-HYDROcodone, HYDROcodone-acetaminophen, HYDROmorphone (DILAUDID) injection, ondansetron **OR** ondansetron (ZOFRAN) IV, polyethylene glycol, prochlorperazine, tetrahydrozoline   I have personally reviewed following labs and imaging studies  LABORATORY DATA: CBC: Recent Labs  Lab 11/03/21 0839 11/02/2021 1607 11/06/21 0716 11/08/21 0307 11/09/21 0636  WBC 21.8* 11.8* 12.5* 12.2* 10.7*  NEUTROABS 19.7* 10.3* 11.7*  --   --   HGB 13.4 11.9* 11.1* 10.1* 10.4*  HCT 39.1 35.4* 33.1* 31.0* 31.5*  MCV 89.9 92.4 93.0 93.7 92.6  PLT 390 284 253 255 273     Basic Metabolic Panel: Recent Labs  Lab 11/27/2021 1607 11/06/21 0716 11/07/21 0559 11/08/21 0307 11/09/21 0636  NA 131* 132* 134* 133* 132*  K 3.7 3.2* 3.8 4.0 4.0  CL 93* 99 103 97* 100  CO2 25 22 24  21* 21*  GLUCOSE 96 93 112* 126* 86  BUN 27* 19 22 22 19   CREATININE 1.09* 0.64 0.58 0.61 0.68  CALCIUM 8.6* 8.1* 8.2* 8.5* 8.3*  MG  --  1.9 1.9  --   --      GFR: Estimated Creatinine Clearance: 50.3 mL/min (by C-G formula based on SCr of 0.68 mg/dL).  Liver Function Tests: Recent Labs  Lab 11/03/21 0839 11/08/2021 1607 11/06/21 0716 11/08/21 0307 11/09/21 0636  AST 21 16 13*  --  13*  ALT 28 20 18   --  14  ALKPHOS 87 84 72  --  60  BILITOT 0.6 0.6 0.7  --  0.5  PROT 6.5 5.8* 5.2*  --  4.8*  ALBUMIN 3.5 2.7* 2.3* 2.0* 2.0*    No results for input(s): "LIPASE", "AMYLASE" in the last 168 hours. No results for input(s): "AMMONIA" in the last 168 hours.  Coagulation Profile: Recent Labs  Lab  11/08/21 0909  INR 1.1     Cardiac Enzymes: No results for input(s): "CKTOTAL", "CKMB", "CKMBINDEX", "TROPONINI" in the last 168 hours.  BNP (last 3 results) No results for input(s): "PROBNP" in the last 8760 hours.  Lipid Profile: No results for input(s): "CHOL", "HDL", "LDLCALC", "TRIG", "CHOLHDL", "LDLDIRECT" in the last 72 hours.  Thyroid Function Tests: No results for input(s): "TSH", "T4TOTAL", "FREET4", "T3FREE", "THYROIDAB" in the last 72 hours.  Anemia Panel: No results for input(s): "VITAMINB12", "FOLATE", "FERRITIN", "TIBC", "IRON", "RETICCTPCT" in the last 72 hours.  Urine analysis:    Component Value Date/Time   COLORURINE STRAW (A) 10/19/2021 1825   APPEARANCEUR CLEAR 10/19/2021 1825   LABSPEC 1.043 (H) 10/19/2021 1825   PHURINE 7.0 10/19/2021 1825   GLUCOSEU NEGATIVE 10/19/2021 1825   HGBUR NEGATIVE 10/19/2021 1825   BILIRUBINUR NEGATIVE 10/19/2021 1825   KETONESUR 20 (A) 10/19/2021 1825   PROTEINUR NEGATIVE 10/19/2021 1825   NITRITE NEGATIVE 10/19/2021 1825   LEUKOCYTESUR NEGATIVE 10/19/2021 1825    Sepsis Labs: Lactic Acid, Venous    Component Value Date/Time   LATICACIDVEN 1.2 11/04/2021 1806    MICROBIOLOGY: Recent Results (from the past 240 hour(s))  MRSA Next Gen by PCR, Nasal     Status: None   Collection Time: 11/07/2021  9:01 PM   Specimen: Nasal Mucosa; Nasal Swab  Result Value Ref Range Status   MRSA by PCR Next Gen NOT DETECTED NOT DETECTED Final    Comment: (NOTE) The GeneXpert MRSA Assay (FDA approved for NASAL specimens only), is one component of a comprehensive MRSA colonization surveillance program. It is not intended to diagnose MRSA infection nor to guide or monitor treatment for MRSA infections. Test performance is not FDA approved in patients less than 8 years old. Performed at [facility], [address] [place], [place] [address]   Culture, blood (Routine X 2) w Reflex to ID Panel     Status: None (Preliminary result)    Collection Time: 11/08/2021  9:41 PM   Specimen: BLOOD LEFT HAND  Result Value Ref Range Status   Specimen Description BLOOD LEFT HAND  Final   Special Requests   Final    BOTTLES DRAWN AEROBIC ONLY Blood Culture adequate volume  Culture   Final    NO GROWTH 4 DAYS Performed at [facility], [address], [place], [place] [address]    Report Status PENDING  Incomplete  Culture, blood (Routine X 2) w Reflex to ID Panel     Status: None (Preliminary result)   Collection Time: 11/18/2021  9:50 PM   Specimen: BLOOD RIGHT HAND  Result Value Ref Range Status   Specimen Description BLOOD RIGHT HAND  Final   Special Requests   Final    BOTTLES DRAWN AEROBIC ONLY Blood Culture adequate volume   Culture   Final    NO GROWTH 4 DAYS Performed at [facility], [address], [place], [place] [address]    Report Status PENDING  Incomplete  Culture, body fluid w Gram Stain-bottle     Status: None (Preliminary result)   Collection Time: 11/07/21  1:06 PM   Specimen: Pleura  Result Value Ref Range Status   Specimen Description PLEURAL  Final   Special Requests   Final    Blood Culture adequate volume BOTTLES DRAWN AEROBIC AND ANAEROBIC   Culture   Final    NO GROWTH 2 DAYS Performed at [facility], [address], [place], [place] [address]    Report Status PENDING  Incomplete  Gram stain     Status: None   Collection Time: 11/07/21  1:06 PM   Specimen: Pleura  Result Value Ref Range Status   Specimen Description PLEURAL  Final   Special Requests NONE  Final   Gram Stain   Final    NO ORGANISMS SEEN WBC PRESENT, PREDOMINANTLY MONONUCLEAR CYTOSPIN SMEAR Performed at [facility], [address] [place], [place] [address]    Report Status 11/07/2021 FINAL  Final    RADIOLOGY STUDIES/RESULTS: DG CHEST PORT 1 VIEW  Result Date: 11/09/2021 CLINICAL DATA:  Pleural effusion EXAM: PORTABLE CHEST 1 VIEW COMPARISON:  Chest x-ray dated November 08, 2021 FINDINGS: Cardiac and mediastinal contours are  within normal limits. Left hydropneumothorax with interval decreased size of fluid component and increased air component. Stable size of right pleural effusion. Bilateral pleural thickening. IMPRESSION: Left hydropneumothorax with interval decreased size of fluid component and increased air component and minimal re-expansion of the lung. Left-sided chest tube in place. Electronically Signed   By: Yetta Glassman M.D.   On: 11/09/2021 09:08   DG Chest Port 1 View  Result Date: 11/08/2021 CLINICAL DATA:  Chest tube placement EXAM: PORTABLE CHEST 1 VIEW COMPARISON:  Portable exam 1413 hours compared to 11/07/2021 FINDINGS: New pigtail LEFT thoracostomy tube. Small loculated hydropneumothorax LEFT base unchanged. Small RIGHT pleural effusion unchanged. Normal heart size, mediastinal contours, and pulmonary vascularity. Atherosclerotic calcification aorta. Bibasilar atelectasis again noted. IMPRESSION: New LEFT thoracostomy tube with unchanged loculated hydropneumothorax at LEFT lung base. RIGHT pleural effusion and bibasilar atelectasis unchanged. Electronically Signed   By: Lavonia Dana M.D.   On: 11/08/2021 14:26   DG Chest Port 1 View  Result Date: 11/07/2021 CLINICAL DATA:  Follow-up LEFT pneumothorax post thoracentesis EXAM: PORTABLE CHEST 1 VIEW COMPARISON:  Portable exam 1659 hours compared to earlier study of 1322 hours FINDINGS: Normal heart size mediastinal contours with atherosclerotic calcification aorta. Accentuation of interstitial markings unchanged with bibasilar pleural effusions and atelectasis. Small loculated pneumothorax at lateral inferior LEFT chest unchanged. No mediastinal shift. Bones demineralized. IMPRESSION: Persistent/stable loculated LEFT basilar hydropneumothorax. Small RIGHT pleural effusion with bibasilar atelectasis and persistent accentuation of interstitial markings. Electronically Signed   By: Crist Infante.D.  On: 11/07/2021 17:13   DG Chest 1 View  Result Date:  11/07/2021 CLINICAL DATA:  Post LEFT thoracentesis EXAM: CHEST  1 VIEW COMPARISON:  11/28/2021 FINDINGS: Small loculated LEFT basilar pneumothorax post thoracentesis. Bibasilar pleural effusions and atelectasis remain. Stable heart size and mediastinal contours with atherosclerotic calcification aorta. No osseous abnormalities. IMPRESSION: Small loculated hydropneumothorax at LEFT base post thoracentesis. Patient asymptomatic, reports breathing improved since procedure. Follow-up chest radiograph ordered. Electronically Signed   By: Lavonia Dana M.D.   On: 11/07/2021 14:04   US THORACENTESIS ASP PLEURAL SPACE W/IMG GUIDE  Result Date: 11/07/2021 INDICATION: Lung cancer, malignant pleural effusion, shortness of breath, pain, vomiting, question empyema EXAM: ULTRASOUND GUIDED DIAGNOSTIC LEFT THORACENTESIS MEDICATIONS: None. COMPLICATIONS: None immediate. PROCEDURE: An ultrasound guided thoracentesis was thoroughly discussed with the patient and questions answered. The benefits, risks, alternatives and complications were also discussed. The patient understands and wishes to proceed with the procedure. Written consent was obtained. Ultrasound was performed to localize and mark an adequate pocket of fluid in the LEFT chest. The area was then prepped and draped in the normal sterile fashion. 1% Lidocaine was used for local anesthesia. Under ultrasound guidance a 6 Fr Safe-T-Centesis catheter was introduced. Thoracentesis was performed. The catheter was removed and a dressing applied. FINDINGS: A total of approximately 270 mL of amber colored fluid was removed. Samples were sent to the laboratory as requested by the clinical team. IMPRESSION: Successful ultrasound guided diagnostic left thoracentesis yielding 270 mL of pleural fluid. Electronically Signed   By: Lavonia Dana M.D.   On: 11/07/2021 14:01     LOS: 4 days   Oren Binet, MD  [facility]    To contact the attending provider between 7A-7P or  the covering provider during after hours 7P-7A, please log into the web site [email or web address] and access using universal [place] password for that web site. If you do not have the password, please call the hospital operator.  11/09/2021, 11:50 AM

## 2021-11-09 NOTE — Progress Notes (Signed)
OT Cancellation Note  Patient Details Name: Rhonda Cook MRN: [id number] DOB: Dec 01, 1945   Cancelled Treatment:    Reason Eval/Treat Not Completed: Patient at procedure or test/ unavailable (MRI)  [name] 11/09/2021, 2:42 PM Cleta Alberts, OTR/L Acute Rehabilitation Services Office: [phone]

## 2021-11-09 NOTE — Telephone Encounter (Signed)
LVM that : Pts DOB is 1946-03-21 and the [facility] 360 order on 84/85 is not duplicate. I do not see any other [facility] orders before or after 11/03/21.

## 2021-11-09 NOTE — Treatment Plan (Signed)
CARDIOLOGY BRIEF NOTE  Called by overnight hospitalist regarding patient newly in SVT with HR in the 180s and SBPs in the 80s. Patient had been given 2.5mg  IV metoprolol without any improvement. We had discussed use of adenosine however given her primary admission was for respiratory failure in the setting of malignant pIeural effusions and a history of severe COPD, there was concern for bronchospam. We also discussed amiodarone but decided to hold off for now given history of autoimmune thyroiditis.   I arrived at bedside and we attempted some vagal maneuvers without improvement. We then gabe 5mg  IV metoprolol which did improve HRs to 90s-100s. HR eventually went back up to 130s-140s wiuth SBPs in the 100s. We agreed to start with metoprolol tartrate 12.5mg  q6h.    PLAN: - start metoprolol tartrate 12.5mg  q6h; can increase dose as needed to achieve goal HR< 110 - ok to redose 5mg  IV metoprolol as tolerate for sustained HR >180bpm - would consider amiodarone bolus as next line - would consider loading (5g load), if bolus not successful  Margie Billet, MD  11/09/2021 11:08 PM

## 2021-11-09 NOTE — Progress Notes (Signed)
OT Cancellation Note  Patient Details Name: Rhonda Cook MRN: [id number] DOB: 1945/08/15   Cancelled Treatment:    Reason Eval/Treat Not Completed: Fatigue/lethargy limiting ability to participate  [name] 11/09/2021, 10:56 AM Cleta Alberts, OTR/L Acute Rehabilitation Services Office: [phone]

## 2021-11-09 NOTE — Plan of Care (Signed)
  Problem: Health Behavior/Discharge Planning: Goal: Ability to manage health-related needs will improve Outcome: Progressing   Problem: Clinical Measurements: Goal: Will remain free from infection Outcome: Progressing   Problem: Clinical Measurements: Goal: Diagnostic test results will improve Outcome: Progressing   Problem: Clinical Measurements: Goal: Respiratory complications will improve Outcome: Progressing

## 2021-11-09 NOTE — Progress Notes (Signed)
PT Cancellation Note  Patient Details Name: Rhonda Cook MRN: [id number] DOB: Sep 25, 1945   Cancelled Treatment:    Reason Eval/Treat Not Completed: Other (comment) - pt very nauseated and lethargic. PT will continue to f/u with pt acutely as available and appropriate.    Clearnce Sorrel Zadie Deemer 11/09/2021, 10:58 AM

## 2021-11-09 NOTE — Progress Notes (Signed)
Assisted Dr Erin Fulling with bedside procedure.  Right Thoracentesis Patient complained of some chest pain which resolved post procedure. Pleural Fluid sent to lab. Awaiting PCXR

## 2021-11-09 NOTE — Procedures (Signed)
Thoracentesis  Procedure Note  ELLOISE ROARK  [id number]  29-Jun-1945  Date:11/09/21  Time:4:12 PM   Provider Performing:Brewster Wolters B Rilynne Lonsway   Procedure: Thoracentesis with imaging guidance (71252)  Indication(s) Pleural Effusion  Consent Risks of the procedure as well as the alternatives and risks of each were explained to the patient and/or caregiver.  Consent for the procedure was obtained and is signed in the bedside chart  Anesthesia Topical only with 1% lidocaine    Time Out Verified patient identification, verified procedure, site/side was marked, verified correct patient position, special equipment/implants available, medications/allergies/relevant history reviewed, required imaging and test results available.   Sterile Technique Maximal sterile technique including full sterile barrier drape, hand hygiene, sterile gown, sterile gloves, mask, hair covering, sterile ultrasound probe cover (if used).  Procedure Description Ultrasound was used to identify appropriate pleural anatomy for placement and overlying skin marked.  Area of drainage cleaned and draped in sterile fashion. Lidocaine was used to anesthetize the skin and subcutaneous tissue.  500 cc's of bloody appearing fluid was drained from the right pleural space. Catheter then removed and bandaid applied to site.   Complications/Tolerance None; patient tolerated the procedure well. Chest X-ray is ordered to confirm no post-procedural complication.   EBL Minimal   Specimen(s) Pleural fluid

## 2021-11-09 NOTE — Progress Notes (Signed)
NAME:  Rhonda Cook, MRN:  [id number], DOB:  June 03, 1945, LOS: 4 ADMISSION DATE:  11/28/2021, CONSULTATION DATE:  11/08/21 REFERRING MD: TRH CHIEF COMPLAINT:  Pleural EFfusion  History of Present Illness:  76 yo female smoker was in hospital in June 2023 with dyspnea and cough from large bilateral pleural effusions.  Effusions were exudate with lymphocyte predominance and atypical cells concerning for malignancy.  She had pleural catheter placed by IR on 10/25/21.  She had bronchoscopy with Dr. Valeta Harms on 10/28/21 to assess lung nodules and mediastinal adenopathy, with needle biopsy of 4R lymph node which was positive for NSCLC (squamous cell carcinoma).  She had chest tube removed on 10/29/21.  She had appointment with Dr. 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  She was to have MRI brain and PET scan for staging work up.  She presented to [facility] on 11/05/21 for dyspnea, back pain and vomiting.  Was having sweats and chest pain also.  Pertinent  Medical History  Osteoarthritis, Autoimmune thyroiditis with hypothyroidism, Bladder cancer s/p surgical excision, COPD, HTN, HLD  Significant Hospital Events: Including procedures, antibiotic start and stop dates in addition to other pertinent events   CT angio chest 7/08 >> moderate loculated Lt effusion with new air-fluid levels throughout, Lt pleural thickening, changes of emphysema, smaller Rt pleural effusion 1.4 x 1 cm spiculated nodule Lt apex, 13 mm nodule LUL, pleural based and fissural nodules throughout Rt lung Lt thoracentesis 7/10 >> 270 ml amber fluid, glucose less than 20, LDH 1117, WBC 104 (50% N, 30% L, 20% M)  - 7/11 transferred to [facility], left pigtail chest tube placed, first dose of pleural lytics administerd  Interim History / Subjective:   Patient continues to have severe nausea and some pain.  She is feeling overall a bit stronger today. She rested ok last night. Family is at beside.   68mL  chest tube out put  Objective   Blood pressure 107/70, pulse (!) 101, temperature 98.5 F (36.9 C), temperature source Oral, resp. rate 18, height 5\' 3"  (1.6 m), weight 52.6 kg, SpO2 97 %.        Intake/Output Summary (Last 24 hours) at 11/09/2021 3474 Last data filed at 11/09/2021 2595 Gross per 24 hour  Intake 550 ml  Output 625 ml  Net -75 ml   Filed Weights   10/29/2021 1443  Weight: 52.6 kg   Examination: General: ill appearing, no distress, occasional dry heaves HENT: [place]/AT, moist mucous membranes, sclera anicteric Lungs: diminished breath sounds, no wheezing, chest tube tidaling - no air leak Cardiovascular: rrr, no murmurs Abdomen: soft, non-tender, non-distended, BS+ Extremities: warm, no edema Neuro: a&o x 3, moving all extremities GU: n/a  CXR 7/12 Improvement in left loculated effusion, but hydropneumothorax present concerning for trapped lung  Bedside Chest [place] 7/12 Moderate right pleural effusion with minimal stranding present, overall appears free flowing  Resolved Hospital Problem list     Assessment & Plan:  Malignant Left Pleural effusion with loculations Hydropneumothorax, left Trapped Lung, Left Right Pleural Effusion Acute Hypoxemic Respiratory Failure 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 GERD. HLD. HTN. Moderate protein calorie malnutrition.   - findings are concerning for infection in setting of malignant effusion, s/p chest tube placement 7/11 and pleural lytic therapy administered - No further need for pleural lytics at this time given improvement in loculations - continue cefepime and flagyl.  - chest tube placed to -20 cmH2O suction, flush per nursing protocol -  Will perform thoracentesis today for right pleural effusion - Palliative care has been consulted.  - recommend obtaining MRI brain while in patient - Awaiting outpatient PET scan  PCCM to continue to follow  Best Practice (right click and "Reselect all SmartList  Selections" daily)   Per Primary  Labs   CBC: Recent Labs  Lab 11/03/21 0839 11/26/2021 1607 11/06/21 0716 11/08/21 0307 11/09/21 0636  WBC 21.8* 11.8* 12.5* 12.2* 10.7*  NEUTROABS 19.7* 10.3* 11.7*  --   --   HGB 13.4 11.9* 11.1* 10.1* 10.4*  HCT 39.1 35.4* 33.1* 31.0* 31.5*  MCV 89.9 92.4 93.0 93.7 92.6  PLT 390 284 253 255 294    Basic Metabolic Panel: Recent Labs  Lab 11/08/2021 1607 11/06/21 0716 11/07/21 0559 11/08/21 0307 11/09/21 0636  NA 131* 132* 134* 133* 132*  K 3.7 3.2* 3.8 4.0 4.0  CL 93* 99 103 97* 100  CO2 25 22 24  21* 21*  GLUCOSE 96 93 112* 126* 86  BUN 27* 19 22 22 19   CREATININE 1.09* 0.64 0.58 0.61 0.68  CALCIUM 8.6* 8.1* 8.2* 8.5* 8.3*  MG  --  1.9 1.9  --   --    GFR: Estimated Creatinine Clearance: 50.3 mL/min (by C-G formula based on SCr of 0.68 mg/dL). Recent Labs  Lab 11/01/2021 1607 11/17/2021 1806 11/06/21 0716 11/08/21 0307 11/09/21 0636  WBC 11.8*  --  12.5* 12.2* 10.7*  LATICACIDVEN 1.4 1.2  --   --   --     Liver Function Tests: Recent Labs  Lab 11/03/21 0839 11/09/2021 1607 11/06/21 0716 11/08/21 0307 11/09/21 0636  AST 21 16 13*  --  13*  ALT 28 20 18   --  14  ALKPHOS 87 84 72  --  60  BILITOT 0.6 0.6 0.7  --  0.5  PROT 6.5 5.8* 5.2*  --  4.8*  ALBUMIN 3.5 2.7* 2.3* 2.0* 2.0*   No results for input(s): "LIPASE", "AMYLASE" in the last 168 hours. No results for input(s): "AMMONIA" in the last 168 hours.  ABG    Component Value Date/Time   TCO2 26 09/16/2019 1015     Coagulation Profile: Recent Labs  Lab 11/08/21 0909  INR 1.1    Cardiac Enzymes: No results for input(s): "CKTOTAL", "CKMB", "CKMBINDEX", "TROPONINI" in the last 168 hours.  HbA1C: No results found for: "HGBA1C"  CBG: No results for input(s): "GLUCAP" in the last 168 hours.    Critical care time: n/a    Freda Jackson, MD [facility] Office: [phone]   See Amion for personal pager PCCM on call pager [phone] until 7pm. Please call Elink 7p-7a. [phone]

## 2021-11-10 DIAGNOSIS — J869 Pyothorax without fistula: Secondary | ICD-10-CM | POA: Diagnosis not present

## 2021-11-10 DIAGNOSIS — K219 Gastro-esophageal reflux disease without esophagitis: Secondary | ICD-10-CM | POA: Diagnosis not present

## 2021-11-10 DIAGNOSIS — I10 Essential (primary) hypertension: Secondary | ICD-10-CM | POA: Diagnosis not present

## 2021-11-10 DIAGNOSIS — J9601 Acute respiratory failure with hypoxia: Secondary | ICD-10-CM | POA: Diagnosis not present

## 2021-11-10 LAB — CULTURE, BLOOD (ROUTINE X 2)
Culture: NO GROWTH
Culture: NO GROWTH
Special Requests: ADEQUATE
Special Requests: ADEQUATE

## 2021-11-10 LAB — TSH: TSH: 8.237 u[IU]/mL — ABNORMAL HIGH (ref 0.350–4.500)

## 2021-11-10 LAB — TRIGLYCERIDES, BODY FLUIDS: Triglycerides, Fluid: 43 mg/dL

## 2021-11-10 LAB — T4, FREE: Free T4: 1.54 ng/dL — ABNORMAL HIGH (ref 0.61–1.12)

## 2021-11-10 MED ORDER — SODIUM CHLORIDE 0.9 % IV BOLUS
500.0000 mL | INTRAVENOUS | Status: AC
Start: 2021-11-10 — End: 2021-11-10
  Administered 2021-11-10: 500 mL via INTRAVENOUS

## 2021-11-10 MED ORDER — HYDROMORPHONE BOLUS VIA INFUSION: 1.0000 mg | INTRAVENOUS | Status: DC | PRN

## 2021-11-10 MED ORDER — HALOPERIDOL LACTATE 2 MG/ML PO CONC: 0.5000 mg | ORAL | Status: DC | PRN

## 2021-11-10 MED ORDER — LORAZEPAM 2 MG/ML IJ SOLN
1.0000 mg | INTRAMUSCULAR | Status: DC | PRN
  Administered 2021-11-10: 1 mg via INTRAVENOUS

## 2021-11-10 MED ORDER — GLYCOPYRROLATE 0.2 MG/ML IJ SOLN: 0.2000 mg | INTRAMUSCULAR | Status: DC | PRN

## 2021-11-10 MED ORDER — LOPERAMIDE HCL 2 MG PO CAPS: 2.0000 mg | ORAL_CAPSULE | ORAL | Status: DC | PRN

## 2021-11-10 MED ORDER — HALOPERIDOL LACTATE 5 MG/ML IJ SOLN: 0.5000 mg | INTRAMUSCULAR | Status: DC | PRN

## 2021-11-10 MED ORDER — DIPHENHYDRAMINE HCL 50 MG/ML IJ SOLN: 12.5000 mg | INTRAMUSCULAR | Status: DC | PRN

## 2021-11-10 MED ORDER — SODIUM CHLORIDE 0.9% FLUSH: 3.0000 mL | INTRAVENOUS | Status: DC | PRN

## 2021-11-10 MED ORDER — GLYCOPYRROLATE 1 MG PO TABS: 1.0000 mg | ORAL_TABLET | ORAL | Status: DC | PRN

## 2021-11-10 MED ORDER — HALOPERIDOL 0.5 MG PO TABS: 0.5000 mg | ORAL_TABLET | ORAL | Status: DC | PRN

## 2021-11-10 MED ORDER — LORAZEPAM 2 MG/ML PO CONC: 1.0000 mg | ORAL | Status: DC | PRN

## 2021-11-10 MED ORDER — BIOTENE DRY MOUTH MT LIQD: 15.0000 mL | OROMUCOSAL | Status: DC | PRN

## 2021-11-10 MED ORDER — SODIUM CHLORIDE 0.9 % IV SOLN: 12.5000 mg | Freq: Four times a day (QID) | INTRAVENOUS | Status: DC | PRN

## 2021-11-10 MED ORDER — MAGIC MOUTHWASH W/LIDOCAINE: 15.0000 mL | Freq: Four times a day (QID) | ORAL | Status: DC | PRN

## 2021-11-10 MED ORDER — OXYBUTYNIN CHLORIDE 5 MG PO TABS: 2.5000 mg | ORAL_TABLET | Freq: Four times a day (QID) | ORAL | Status: DC | PRN

## 2021-11-10 MED ORDER — ALBUTEROL SULFATE (2.5 MG/3ML) 0.083% IN NEBU: 2.5000 mg | INHALATION_SOLUTION | RESPIRATORY_TRACT | Status: DC | PRN

## 2021-11-10 MED ORDER — LORAZEPAM 1 MG PO TABS: 1.0000 mg | ORAL_TABLET | ORAL | Status: DC | PRN

## 2021-11-10 MED ORDER — SODIUM CHLORIDE 0.9 % IV SOLN: 250.0000 mL | INTRAVENOUS | Status: DC | PRN

## 2021-11-10 MED ORDER — MIDODRINE HCL 5 MG PO TABS: 5.0000 mg | ORAL_TABLET | Freq: Three times a day (TID) | ORAL | Status: DC

## 2021-11-10 MED ORDER — SODIUM CHLORIDE 0.9% FLUSH
3.0000 mL | Freq: Two times a day (BID) | INTRAVENOUS | Status: DC
  Administered 2021-11-11: 3 mL via INTRAVENOUS

## 2021-11-10 MED ORDER — ACETAMINOPHEN 325 MG PO TABS: 650.0000 mg | ORAL_TABLET | Freq: Four times a day (QID) | ORAL | Status: DC | PRN

## 2021-11-10 MED ORDER — NYSTATIN 100000 UNIT/GM EX POWD: Freq: Three times a day (TID) | CUTANEOUS | Status: DC | PRN

## 2021-11-10 MED ORDER — SODIUM CHLORIDE 0.9 % IV SOLN
1.0000 mg/h | INTRAVENOUS | Status: DC
  Administered 2021-11-10: 1 mg/h via INTRAVENOUS
  Filled 2021-11-10: qty 5

## 2021-11-10 MED ORDER — LORAZEPAM 2 MG/ML IJ SOLN
1.0000 mg | INTRAMUSCULAR | Status: DC | PRN
  Filled 2021-11-10: qty 1

## 2021-11-10 MED ORDER — ACETAMINOPHEN 650 MG RE SUPP: 650.0000 mg | Freq: Four times a day (QID) | RECTAL | Status: DC | PRN

## 2021-11-10 MED ORDER — METOPROLOL TARTRATE 12.5 MG HALF TABLET
12.5000 mg | ORAL_TABLET | ORAL | Status: AC
Start: 2021-11-10 — End: 2021-11-10
  Administered 2021-11-10: 12.5 mg via ORAL
  Filled 2021-11-10: qty 1

## 2021-11-10 NOTE — Progress Notes (Signed)
OT Cancellation Note  Patient Details Name: Rhonda Cook MRN: [id number] DOB: 01-01-1946   Cancelled Treatment:    Reason Eval/Treat Not Completed:  (Plan is for discharge to residential hospice per MD note. OT signing off.)  [name] 11/10/2021, 10:07 AM Cleta Alberts, OTR/L Acute Rehabilitation Services Office: [phone]

## 2021-11-10 NOTE — Progress Notes (Signed)
Palliative: Chart review completed.  Patient and family have elected to focus on comfort and dignity, let nature take its course.  They are requesting residential hospice with [facility].  Referral made by transition of care team.  Plan: Comfort and dignity at end-of-life, residential hospice at [facility].  No charge [name], NP Palliative medicine team Team phone [phone] Greater than 50% of this time was spent counseling and coordinating care related to the above assessment and plan.

## 2021-11-10 NOTE — Progress Notes (Addendum)
Received a call from bedside RN regarding new onset SVT with heart rate in the 149P, complicated by hypotension with SBP in the 80's and dropping in the 70's.  Initially gave IV Lopressor 2.5 mg x 1 without improvement.  Presented at bedside.  The patient is alert and conversant.  Her only complaint is of back pain which is chronic.  Denies palpitations.  Consulted cardiology on-call Dr. Mitzie Na who also presented at bedside.  We attempted vagal maneuvers without improvement.  Due to concern for bronchospasm in the setting of malignant pleural effusion with chest tube and history of severe COPD, adenosine was not used.  Another dose of IV Lopressor 5 mg x 1 was administered with the guidance of cardiology.  Additionally, the patient received 1 L normal saline IV fluid bolus.  Heart rate came down.  P.o. Lopressor 12.5 mg every 6 hours was added and given to the patient.  Due to uprising heart rate, another dose of p.o. Lopressor 12.5 mg was given.  The patient converted to sinus rhythm.  Another bolus 500 cc IV normal saline and 1 dose of midodrine 10 mg were ordered to maintain MAP greater than 65.  Twelve-lead EKG also ordered to confirm rhythm.  Highly appreciate cardiology's assistance and co-management.  Addendum:  TSH 8.237.  Free T4 is pending.

## 2021-11-10 NOTE — Progress Notes (Signed)
   11/09/21 2201  Assess: MEWS Score  Temp 97.8 F (36.6 C)  BP (!) 85/69  MAP (mmHg) 76  Pulse Rate (!) 175  ECG Heart Rate (!) 175  Resp 20  Level of Consciousness Alert  SpO2 94 %  O2 Device Nasal Cannula  O2 Flow Rate (L/min) 2 L/min  Assess: MEWS Score  MEWS Temp 0  MEWS Systolic 1  MEWS Pulse 3  MEWS RR 0  MEWS LOC 0  MEWS Score 4  MEWS Score Color Red  Assess: if the MEWS score is Yellow or Red  Were vital signs taken at a resting state? Yes  Focused Assessment No change from prior assessment  Does the patient meet 2 or more of the SIRS criteria? No  Does the patient have a confirmed or suspected source of infection? Yes  Provider and Rapid Response Notified?  (At the bedside)  MEWS guidelines implemented *See Row Information* Yes  Treat  MEWS Interventions Administered prn meds/treatments  Pain Scale 0-10  Pain Score 0  Take Vital Signs  Increase Vital Sign Frequency  Red: Q 1hr X 4 then Q 4hr X 4, if remains red, continue Q 4hrs  Escalate  MEWS: Escalate Red: discuss with charge nurse/RN and provider, consider discussing with RRT  Notify: Charge Nurse/RN  Name of Charge Nurse/RN Notified Janie, RN  Date Charge Nurse/RN Notified 11/09/21  Time Charge Nurse/RN Notified 38  Notify: Provider  Provider Name/Title Dr. Nevada Crane  Date Provider Notified 11/09/21  Time Provider Notified 2148  Method of Notification Face-to-face  Notification Reason Other (Comment) (Changed from Yellow to Red MEWS during stabilization with providers at the bedside)  Provider response At bedside  Date of Provider Response 11/09/21  Time of Provider Response 2153  Document  Patient Outcome Stabilized after interventions  Progress note created (see row info) Yes  Assess: SIRS CRITERIA  SIRS Temperature  0  SIRS Pulse 1  SIRS Respirations  0  SIRS WBC 0  SIRS Score Sum  1

## 2021-11-10 NOTE — IPAL (Addendum)
  Interdisciplinary Goals of Care Family Meeting   Date carried out: 11/10/2021  Location of the meeting: Bedside  Member's involved: Physician and Family Member or next of kin  Durable Power of Attorney or Loss adjuster, chartered: Patient-spouse at bedside.  Discussion: She has had a rough night-husband at bedside does not see any improvement-she is not had any significant oral intake for the past several weeks-continues to have persistent nausea-and is in persistent pain.  Her functional status has markedly deteriorated over the past several weeks.  Patient is already a DNR-after extensive discussion-with spouse/patient-we have decided to transition to full comfort measures.  She is in a significant amount of pain-we will start Dilaudid infusion-see orders.  Will consult social worker for residential hospice placement.  Discussed with PCCM MD-Dr. Jannett Celestine agrees and will place orders to remove chest tube.  Code status: Full DNR  Disposition: In-patient comfort care at the residential hospice  Time spent for the meeting: 40 min    Oren Binet, MD  11/10/2021, 9:22 AM

## 2021-11-10 NOTE — Progress Notes (Signed)
   11/09/21 2145  Assess: MEWS Score  Temp 98.5 F (36.9 C)  BP 109/68  MAP (mmHg) 81  Pulse Rate (!) 184  ECG Heart Rate (!) 183  Resp 19  Level of Consciousness Alert  SpO2 94 %  O2 Device Nasal Cannula  O2 Flow Rate (L/min) 2 L/min  Assess: MEWS Score  MEWS Temp 0  MEWS Systolic 0  MEWS Pulse 3  MEWS RR 0  MEWS LOC 0  MEWS Score 3  MEWS Score Color Yellow  Assess: if the MEWS score is Yellow or Red  Were vital signs taken at a resting state? Yes  Focused Assessment No change from prior assessment  Does the patient meet 2 or more of the SIRS criteria? No  Does the patient have a confirmed or suspected source of infection? Yes  Provider and Rapid Response Notified? Yes  MEWS guidelines implemented *See Row Information* Yes  Treat  MEWS Interventions Escalated (See documentation below)  Pain Scale 0-10  Pain Score 0  Take Vital Signs  Increase Vital Sign Frequency  Yellow: Q 2hr X 2 then Q 4hr X 2, if remains yellow, continue Q 4hrs  Escalate  MEWS: Escalate Yellow: discuss with charge nurse/RN and consider discussing with provider and RRT  Notify: Charge Nurse/RN  Name of Charge Nurse/RN Notified Janie, RN  Date Charge Nurse/RN Notified 11/09/21  Time Charge Nurse/RN Notified 2130  Notify: Provider  Provider Name/Title Dr. Nevada Crane  Date Provider Notified 11/09/21  Time Provider Notified 2148  Method of Notification Page  Notification Reason Change in status  Provider response En route;See new orders  Date of Provider Response 11/09/21  Time of Provider Response 2153  Notify: Rapid Response  Name of Rapid Response RN Notified Waunita Schooner, RN  Date Rapid Response Notified 11/09/21  Time Rapid Response Notified 2130  Document  Patient Outcome Stabilized after interventions  Progress note created (see row info) Yes  Assess: SIRS CRITERIA  SIRS Temperature  0  SIRS Pulse 1  SIRS Respirations  0  SIRS WBC 0  SIRS Score Sum  1

## 2021-11-10 NOTE — Progress Notes (Signed)
NAME:  Rhonda Cook, MRN:  [id number], DOB:  06/16/1945, LOS: 5 ADMISSION DATE:  11/03/2021, CONSULTATION DATE:  11/08/21 REFERRING MD: TRH CHIEF COMPLAINT:  Pleural EFfusion  History of Present Illness:  76 yo female smoker was in hospital in June 2023 with dyspnea and cough from large bilateral pleural effusions.  Effusions were exudate with lymphocyte predominance and atypical cells concerning for malignancy.  She had pleural catheter placed by IR on 10/25/21.  She had bronchoscopy with Dr. Valeta Harms on 10/28/21 to assess lung nodules and mediastinal adenopathy, with needle biopsy of 4R lymph node which was positive for NSCLC (squamous cell carcinoma).  She had chest tube removed on 10/29/21.  She had appointment with Dr. 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  She was to have MRI brain and PET scan for staging work up.  She presented to [facility] on 11/05/21 for dyspnea, back pain and vomiting.  Was having sweats and chest pain also.  Pertinent  Medical History  Osteoarthritis, Autoimmune thyroiditis with hypothyroidism, Bladder cancer s/p surgical excision, COPD, HTN, HLD  Significant Hospital Events: Including procedures, antibiotic start and stop dates in addition to other pertinent events   CT angio chest 7/08 >> moderate loculated Lt effusion with new air-fluid levels throughout, Lt pleural thickening, changes of emphysema, smaller Rt pleural effusion 1.4 x 1 cm spiculated nodule Lt apex, 13 mm nodule LUL, pleural based and fissural nodules throughout Rt lung Lt thoracentesis 7/10 >> 270 ml amber fluid, glucose less than 20, LDH 1117, WBC 104 (50% N, 30% L, 20% M)  - 7/11 transferred to [facility], left pigtail chest tube placed, first dose of pleural lytics administerd  Interim History / Subjective:   Patient developed episode of SVT overnight which responded to beta blockers She is resting comfortably after receiving pain medicine. She has decided to  transition to hospice care given her ongoing decline and wish to be more comfortable now.  Objective   Blood pressure (!) 98/54, pulse 77, temperature 97.9 F (36.6 C), temperature source Oral, resp. rate 16, height 5\' 3"  (1.6 m), weight 52.6 kg, SpO2 97 %.        Intake/Output Summary (Last 24 hours) at 11/10/2021 1129 Last data filed at 11/10/2021 0745 Gross per 24 hour  Intake 730 ml  Output 1700 ml  Net -970 ml   Filed Weights   11/28/2021 1443  Weight: 52.6 kg   Examination: General: ill appearing, no distress, sleeping HENT: [place]/AT, moist mucous membranes, sclera anicteric Lungs: diminished breath sounds, no wheezing, chest tube tidaling - no air leak Cardiovascular: rrr, no murmurs Abdomen: soft, non-tender, non-distended, BS+ Extremities: warm, no edema Neuro: a&o x 3, moving all extremities GU: n/a  CXR 7/12 Improvement in left loculated effusion, but hydropneumothorax present concerning for trapped lung  Bedside Chest [place] 7/12 Moderate right pleural effusion with minimal stranding present, overall appears free flowing  Resolved Hospital Problem list     Assessment & Plan:  Malignant Left Pleural effusion with loculations Hydropneumothorax, left Trapped Lung, Left Right Pleural Effusion Acute Hypoxemic Respiratory Failure 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 GERD. HLD. HTN. Moderate protein calorie malnutrition.  - order placed to remove left sided chest tube - patient will transition to hospice care - answered all questions by family at the bedside  Best Practice (right click and "Reselect all SmartList Selections" daily)   Per Primary  Labs   CBC: Recent Labs  Lab 11/21/2021 1607 11/06/21  6599 11/08/21 0307 11/09/21 0636 11/09/21 1856 11/09/21 2229  WBC 11.8* 12.5* 12.2* 10.7* 10.7* 14.1*  NEUTROABS 10.3* 11.7*  --   --   --   --   HGB 11.9* 11.1* 10.1* 10.4* 10.5* 11.2*  HCT 35.4* 33.1* 31.0* 31.5* 31.0* 34.1*  MCV 92.4 93.0 93.7  92.6 90.9 93.7  PLT 284 253 255 273 269 357    Basic Metabolic Panel: Recent Labs  Lab 11/06/21 0716 11/07/21 0559 11/08/21 0307 11/09/21 0636 11/09/21 2229  NA 132* 134* 133* 132* 132*  K 3.2* 3.8 4.0 4.0 4.3  CL 99 103 97* 100 101  CO2 22 24 21* 21* 16*  GLUCOSE 93 112* 126* 86 92  BUN 19 22 22 19 18   CREATININE 0.64 0.58 0.61 0.68 0.55  CALCIUM 8.1* 8.2* 8.5* 8.3* 8.2*  MG 1.9 1.9  --   --   --    GFR: Estimated Creatinine Clearance: 50.3 mL/min (by C-G formula based on SCr of 0.55 mg/dL). Recent Labs  Lab 11/07/2021 1607 11/04/2021 1806 11/06/21 0716 11/08/21 0307 11/09/21 0636 11/09/21 1856 11/09/21 2229  WBC 11.8*  --    < > 12.2* 10.7* 10.7* 14.1*  LATICACIDVEN 1.4 1.2  --   --   --   --   --    < > = values in this interval not displayed.    Liver Function Tests: Recent Labs  Lab 10/31/2021 1607 11/06/21 0716 11/08/21 0307 11/09/21 0636  AST 16 13*  --  13*  ALT 20 18  --  14  ALKPHOS 84 72  --  60  BILITOT 0.6 0.7  --  0.5  PROT 5.8* 5.2*  --  4.8*  ALBUMIN 2.7* 2.3* 2.0* 2.0*   No results for input(s): "LIPASE", "AMYLASE" in the last 168 hours. No results for input(s): "AMMONIA" in the last 168 hours.  ABG    Component Value Date/Time   TCO2 26 09/16/2019 1015     Coagulation Profile: Recent Labs  Lab 11/08/21 0909  INR 1.1    Cardiac Enzymes: No results for input(s): "CKTOTAL", "CKMB", "CKMBINDEX", "TROPONINI" in the last 168 hours.  HbA1C: No results found for: "HGBA1C"  CBG: No results for input(s): "GLUCAP" in the last 168 hours.    Critical care time: n/a    Freda Jackson, MD [facility] Office: [phone]   See Amion for personal pager PCCM on call pager [phone] until 7pm. Please call Elink 7p-7a. [phone]

## 2021-11-10 NOTE — Plan of Care (Signed)
  Problem: Clinical Measurements: Goal: Diagnostic test results will improve Outcome: Progressing   Problem: Clinical Measurements: Goal: Respiratory complications will improve Outcome: Progressing   Problem: Clinical Measurements: Goal: Cardiovascular complication will be avoided Outcome: Progressing   Problem: Activity: Goal: Risk for activity intolerance will decrease Outcome: Progressing   Problem: Nutrition: Goal: Adequate nutrition will be maintained Outcome: Progressing   Problem: Coping: Goal: Level of anxiety will decrease Outcome: Progressing

## 2021-11-10 NOTE — Progress Notes (Signed)
Pt refused morning labs - MD notified

## 2021-11-10 NOTE — TOC Progression Note (Addendum)
Transition of Care [facility]) - Progression Note    Patient Details  Name: Rhonda Cook MRN: [id number] Date of Birth: 1945/05/31  Transition of Care [facility]) CM/SW [place], LCSW Phone Number: 11/10/2021, 10:11 AM  Clinical Narrative:    10am-CSW received consult from MD for residential hospice facility with family preference for [facility]. CSW submitted referral to [facility] for review. CSW submitted auth request to [facility] for PTAR services.   3:06pm-[employer] ptar auth received, # [id number].   Expected Discharge Plan: [place] Barriers to Discharge: Hospice Bed not available  Expected Discharge Plan and Services Expected Discharge Plan: [place] In-house Referral: Clinical Social Work, Hospice / Palliative Care   Post Acute Care Choice: Hospice                                         Social Determinants of Health (SDOH) Interventions    Readmission Risk Interventions     No data to display

## 2021-11-10 NOTE — Progress Notes (Signed)
PROGRESS NOTE        PATIENT DETAILS Name: Rhonda Cook Age: 76 y.o. Sex: female Date of Birth: [birth year] Admit Date: 11/23/2021 Admitting Physician Rolla Plate, DO ALP:[facility], [facility]  Brief Summary: Patient is a 76 y.o.  female who was recently hospitalized for malignant pleural effusion requiring chest tube placement (tube removed on 7/1)-presented to [facility] with shortness of breath-found to have worsening of pleural effusion-and subsequently transferred to [facility] for further evaluation and treatment.    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 7/06>> evaluated by oncologist-Dr. Julien Nordmann in the outpatient setting.  PET scan/MRI brain ordered. 7/8>> admit to [facility] for SOB-CT with findings concerning for empyema. 7/10>> s/p thoracocentesis by IR and transfer to [facility] 7/11>> left chest tube placement by PCCM 7/12>> right sided thoracocentesis by PCCM-bloody pleural fluid. 7/12>> SVT requiring cardiology consultation. 7/13>> transition to full comfort measures-left chest tube removed.  Significant studies: 7/8>> CT angio chest: No PE-moderate loculated left pleural effusion with new air-fluid levels: Right pleural effusion.  Significant microbiology data: 7/8>> blood culture: Negative 7/8>> pleural fluid culture: No growth 7/10>> pleural fluid culture: No growth 7/10>> pleural fluid AFB culture: Pending   Procedures: 6/22>> ultrasound-guided thoracocentesis 6/27-7/1>> chest tube placement/removal. 6/30>> bronchoscopy with EBUS 7/10>> thoracocentesis by IR (LDH 1117, WBCs 104-50% neutrophils) 7/11>> left chest tube placement by PCCM 7/12>> right-sided thoracocentesis   Consults: PCCM  Subjective: Looks miserable-a lot of pain-nausea continues.  Spouse at bedside-claims that patient has had significant decline in function  over the past several weeks-hardly any oral intake for close to 2 weeks (few bites here and there)-both spouse/patient wanting to transition to comfort measures.  Objective: Vitals: Blood pressure (!) 98/54, pulse 77, temperature 97.9 F (36.6 C), temperature source Oral, resp. rate 16, height 5\' 3"  (1.6 m), weight 52.6 kg, SpO2 97 %.   Exam: Gen Exam: Nauseous-looks uncomfortable-but not in acute distress. HEENT:atraumatic, normocephalic Chest: B/L clear to auscultation anteriorly CVS:S1S2 regular Abdomen:soft non tender, non distended Extremities:no edema Neurology: Non focal-generalized weakness. Skin: no rash   Pertinent Labs/Radiology:    Latest Ref Rng & Units 11/09/2021   10:29 PM 11/09/2021    6:56 PM 11/09/2021    6:36 AM  CBC  WBC 4.0 - 10.5 K/uL 14.1  10.7  10.7   Hemoglobin 12.0 - 15.0 g/dL 11.2  10.5  10.4   Hematocrit 36.0 - 46.0 % 34.1  31.0  31.5   Platelets 150 - 400 K/uL 350  269  273     Lab Results  Component Value Date   NA 132 (L) 11/09/2021   K 4.3 11/09/2021   CL 101 11/09/2021   CO2 16 (L) 11/09/2021      Assessment/Plan: Acute hypoxic respiratory failure: Due to pleural effusion-requiring 2-3 L of oxygen but appears uncomfortable today-after extensive discussion with patient/spouse at bedside-plan is to transition to full comfort measures.  Parapneumonic effusion/empyema superimposed on malignant pleural effusion: Based off CT imaging-s/p left-sided chest tube insertion on 7/11 and 1 round of lytic therapy.  Subsequently underwent right-sided thoracocentesis-bloody fluid drained.  Appears much more uncomfortable today-complains of pain-ongoing nausea-suspect has significant cancer burden in her chest.  After extensive discussion with patient/spouse at bedside-transitioning to full comfort measures today.  Stopping all antibiotics-chest tube removed.  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 Dorothe Pea intermittent nausea-we will go ahead and get MRI brain done inpatient (was ordered for the outpatient setting) PET scan will have to be done in the outpatient setting.  HTN: BP stable-off all antihypertensives-no plans to resume given comfort measures.  HLD: Stopping statin-no plans to resume given comfort measures.  Hypothyroidism: Stopping Synthroid-no plans to resume given comfort measures.  GERD: Stop Pepcid-no plans to resume given comfort measures.  History of bladder cancer-s/p chemotherapy: Followed by urologist-Dr. Gilford Rile.  Palliative care: DNR in place.  Long discussion with spouse and patient at bedside-did not see any significant functional improvement-she feels she is suffering-with persistent nausea/poor appetite/pain-and do not see any improvement in spite of thoracocentesis/chest tube placement.  Given patient's prior desire not to prolong her life-face of incurable illness-we have decided to transition to comfort measures.  I have consulted social work for residential hospice placement.  Given persistent pain/distress-have started low-dose Dilaudid infusion.  Nutrition Status: Nutrition Problem: Severe Malnutrition Etiology: cancer and cancer related treatments Signs/Symptoms: moderate fat depletion, severe fat depletion, moderate muscle depletion, severe muscle depletion, mild muscle depletion, mild fat depletion Interventions: Ensure Enlive (each supplement provides 350kcal and 20 grams of protein), Magic cup, MVI   BMI: Estimated body mass index is 20.55 kg/m as calculated from the following:   Height as of this encounter: 5\' 3"  (1.6 m).   Weight as of this encounter: 52.6 kg.   Code status:   Code Status: DNR   DVT Prophylaxis:    Family Communication: Spouse at bedside.   Disposition Plan: Status is: Inpatient Remains inpatient appropriate because: Optimize comfort measures-await residential hospice  bed placement   Planned Discharge Destination: Residential hospice.   Diet: Diet Order             Diet regular Room service appropriate? Yes; Fluid consistency: Thin  Diet effective now                     Antimicrobial agents: Anti-infectives (From admission, onward)    Start     Dose/Rate Route Frequency Ordered Stop   11/07/21 1300  metroNIDAZOLE (FLAGYL) IVPB 500 mg  Status:  Discontinued        500 mg 100 mL/hr over 60 Minutes Intravenous Every 12 hours 11/07/21 1224 11/10/21 0927   11/06/21 2200  vancomycin (VANCOREADY) IVPB 750 mg/150 mL  Status:  Discontinued        750 mg 150 mL/hr over 60 Minutes Intravenous Every 24 hours 11/23/2021 2103 11/07/21 1019   11/06/21 1200  ceFEPIme (MAXIPIME) 2 g in sodium chloride 0.9 % 100 mL IVPB  Status:  Discontinued        2 g 200 mL/hr over 30 Minutes Intravenous Every 12 hours 11/06/21 0851 11/10/21 0927   11/06/21 0600  aztreonam (AZACTAM) 1 g in sodium chloride 0.9 % 100 mL IVPB  Status:  Discontinued        1 g 200 mL/hr over 30 Minutes Intravenous Every 8 hours 11/13/2021 2107 11/06/21 0823   11/13/2021 2045  vancomycin (VANCOCIN) IVPB 1000 mg/200 mL premix        1,000 mg 200 mL/hr over 60 Minutes Intravenous  Once 11/20/2021 2031 11/06/21 0021   11/28/2021 2030  aztreonam (AZACTAM) 2 g in sodium chloride 0.9 % 100 mL IVPB        2 g 200 mL/hr over 30 Minutes Intravenous  Once 11/26/2021  2026 11/10/2021 2131        MEDICATIONS: Scheduled Meds:  sodium chloride flush  3 mL Intravenous Q12H   Continuous Infusions:  sodium chloride     chlorproMAZINE (THORAZINE) 12.5 mg in sodium chloride 0.9 % 25 mL IVPB     HYDROmorphone 1 mg/hr (11/10/21 1031)   promethazine (PHENERGAN) injection (IM or IVPB)     PRN Meds:.sodium chloride, acetaminophen **OR** acetaminophen, albuterol, antiseptic oral rinse, chlorproMAZINE (THORAZINE) 12.5 mg in sodium chloride 0.9 % 25 mL IVPB, diphenhydrAMINE, glycopyrrolate **OR** glycopyrrolate **OR**  glycopyrrolate, haloperidol **OR** haloperidol **OR** haloperidol lactate, HYDROmorphone, loperamide, LORazepam **OR** LORazepam **OR** LORazepam, LORazepam, magic mouthwash w/lidocaine, nystatin, oxybutynin, promethazine (PHENERGAN) injection (IM or IVPB), sodium chloride flush, tetrahydrozoline   I have personally reviewed following labs and imaging studies  LABORATORY DATA: CBC: Recent Labs  Lab 11/02/2021 1607 11/06/21 0716 11/08/21 0307 11/09/21 0636 11/09/21 1856 11/09/21 2229  WBC 11.8* 12.5* 12.2* 10.7* 10.7* 14.1*  NEUTROABS 10.3* 11.7*  --   --   --   --   HGB 11.9* 11.1* 10.1* 10.4* 10.5* 11.2*  HCT 35.4* 33.1* 31.0* 31.5* 31.0* 34.1*  MCV 92.4 93.0 93.7 92.6 90.9 93.7  PLT 284 253 255 273 269 350     Basic Metabolic Panel: Recent Labs  Lab 11/06/21 0716 11/07/21 0559 11/08/21 0307 11/09/21 0636 11/09/21 2229  NA 132* 134* 133* 132* 132*  K 3.2* 3.8 4.0 4.0 4.3  CL 99 103 97* 100 101  CO2 22 24 21* 21* 16*  GLUCOSE 93 112* 126* 86 92  BUN 19 22 22 19 18   CREATININE 0.64 0.58 0.61 0.68 0.55  CALCIUM 8.1* 8.2* 8.5* 8.3* 8.2*  MG 1.9 1.9  --   --   --      GFR: Estimated Creatinine Clearance: 50.3 mL/min (by C-G formula based on SCr of 0.55 mg/dL).  Liver Function Tests: Recent Labs  Lab 11/19/2021 1607 11/06/21 0716 11/08/21 0307 11/09/21 0636  AST 16 13*  --  13*  ALT 20 18  --  14  ALKPHOS 84 72  --  60  BILITOT 0.6 0.7  --  0.5  PROT 5.8* 5.2*  --  4.8*  ALBUMIN 2.7* 2.3* 2.0* 2.0*    No results for input(s): "LIPASE", "AMYLASE" in the last 168 hours. No results for input(s): "AMMONIA" in the last 168 hours.  Coagulation Profile: Recent Labs  Lab 11/08/21 0909  INR 1.1     Cardiac Enzymes: No results for input(s): "CKTOTAL", "CKMB", "CKMBINDEX", "TROPONINI" in the last 168 hours.  BNP (last 3 results) No results for input(s): "PROBNP" in the last 8760 hours.  Lipid Profile: No results for input(s): "CHOL", "HDL", "LDLCALC",  "TRIG", "CHOLHDL", "LDLDIRECT" in the last 72 hours.  Thyroid Function Tests: Recent Labs    11/09/21 2229  TSH 8.237*  FREET4 1.54*    Anemia Panel: No results for input(s): "VITAMINB12", "FOLATE", "FERRITIN", "TIBC", "IRON", "RETICCTPCT" in the last 72 hours.  Urine analysis:    Component Value Date/Time   COLORURINE STRAW (A) 10/19/2021 1825   APPEARANCEUR CLEAR 10/19/2021 1825   LABSPEC 1.043 (H) 10/19/2021 1825   PHURINE 7.0 10/19/2021 1825   GLUCOSEU NEGATIVE 10/19/2021 1825   HGBUR NEGATIVE 10/19/2021 1825   BILIRUBINUR NEGATIVE 10/19/2021 1825   KETONESUR 20 (A) 10/19/2021 1825   PROTEINUR NEGATIVE 10/19/2021 1825   NITRITE NEGATIVE 10/19/2021 1825   LEUKOCYTESUR NEGATIVE 10/19/2021 1825    Sepsis Labs: Lactic Acid, Venous    Component Value Date/Time  LATICACIDVEN 1.2 11/03/2021 1806    MICROBIOLOGY: Recent Results (from the past 240 hour(s))  MRSA Next Gen by PCR, Nasal     Status: None   Collection Time: 11/14/2021  9:01 PM   Specimen: Nasal Mucosa; Nasal Swab  Result Value Ref Range Status   MRSA by PCR Next Gen NOT DETECTED NOT DETECTED Final    Comment: (NOTE) The GeneXpert MRSA Assay (FDA approved for NASAL specimens only), is one component of a comprehensive MRSA colonization surveillance program. It is not intended to diagnose MRSA infection nor to guide or monitor treatment for MRSA infections. Test performance is not FDA approved in patients less than 55 years old. Performed at [facility], [address] [place], [place] [address]   Culture, blood (Routine X 2) w Reflex to ID Panel     Status: None   Collection Time: 11/09/2021  9:41 PM   Specimen: BLOOD LEFT HAND  Result Value Ref Range Status   Specimen Description BLOOD LEFT HAND  Final   Special Requests   Final    BOTTLES DRAWN AEROBIC ONLY Blood Culture adequate volume   Culture   Final    NO GROWTH 5 DAYS Performed at [facility], [address] [place], [place] [address]     Report Status 11/10/2021 FINAL  Final  Culture, blood (Routine X 2) w Reflex to ID Panel     Status: None   Collection Time: 11/17/2021  9:50 PM   Specimen: BLOOD RIGHT HAND  Result Value Ref Range Status   Specimen Description BLOOD RIGHT HAND  Final   Special Requests   Final    BOTTLES DRAWN AEROBIC ONLY Blood Culture adequate volume   Culture   Final    NO GROWTH 5 DAYS Performed at [facility], [address] [place], [place] [address]    Report Status 11/10/2021 FINAL  Final  Culture, body fluid w Gram Stain-bottle     Status: None (Preliminary result)   Collection Time: 11/07/21  1:06 PM   Specimen: Pleura  Result Value Ref Range Status   Specimen Description PLEURAL  Final   Special Requests   Final    Blood Culture adequate volume BOTTLES DRAWN AEROBIC AND ANAEROBIC   Culture   Final    NO GROWTH 3 DAYS Performed at [facility], [address], [place], [place] [address]    Report Status PENDING  Incomplete  Gram stain     Status: None   Collection Time: 11/07/21  1:06 PM   Specimen: Pleura  Result Value Ref Range Status   Specimen Description PLEURAL  Final   Special Requests NONE  Final   Gram Stain   Final    NO ORGANISMS SEEN WBC PRESENT, PREDOMINANTLY MONONUCLEAR CYTOSPIN SMEAR Performed at [facility], [address], [place], [place] [address]    Report Status 11/07/2021 FINAL  Final  Body fluid culture w Gram Stain     Status: None (Preliminary result)   Collection Time: 11/09/21  4:00 PM   Specimen: Pleural Fluid  Result Value Ref Range Status   Specimen Description PLEURAL  Final   Special Requests RIGHT  Final   Gram Stain NO WBC SEEN NO ORGANISMS SEEN   Final   Culture   Final    NO GROWTH < 24 HOURS Performed at [facility], [place] [address], [place],  [address]    Report Status PENDING  Incomplete    RADIOLOGY STUDIES/RESULTS: DG CHEST PORT 1 VIEW  Result Date: 11/09/2021 CLINICAL DATA:  Supraventricular tachycardia. EXAM:  PORTABLE CHEST 1 VIEW COMPARISON:  Chest x-ray 11/09/2021 FINDINGS: Left-sided chest tube is unchanged in position. Small left hydropneumothorax is unchanged. Small right pleural effusion is unchanged. There is central pulmonary vascular congestion and central interstitial prominence similar to prior study. Cardiomediastinal silhouette is stable. No acute fractures are seen. IMPRESSION: 1. No significant interval change in left-sided hydropneumothorax. 2. Stable small right pleural effusion with central opacities favored as edema. Electronically Signed   By: Ronney Asters M.D.   On: 11/09/2021 22:37   DG CHEST PORT 1 VIEW  Result Date: 11/09/2021 CLINICAL DATA:  Post thoracentesis EXAM: PORTABLE CHEST 1 VIEW COMPARISON:  Chest x-ray 11/09/2021 FINDINGS: Left-sided chest tube is unchanged in position. Small left hydropneumothorax is unchanged. There is a small right pleural effusion which has decreased. There is no right-sided pneumothorax. No new focal lung infiltrate. Cardiomediastinal silhouette within normal limits. No acute fractures. IMPRESSION: 1. Small right pleural effusion has decreased.  No pneumothorax. 2. Stable small left-sided hydropneumothorax with chest tube in place. Electronically Signed   By: Ronney Asters M.D.   On: 11/09/2021 17:20   MR BRAIN W WO CONTRAST  Result Date: 11/09/2021 CLINICAL DATA:  Non-small cell lung cancer (NSCLC), staging EXAM: MRI HEAD WITHOUT AND WITH CONTRAST TECHNIQUE: Multiplanar, multiecho pulse sequences of the brain and surrounding structures were obtained without and with intravenous contrast. CONTRAST:  56mL GADAVIST GADOBUTROL 1 MMOL/ML IV SOLN COMPARISON:  None Available. FINDINGS: Brain: There is no acute infarction or intracranial hemorrhage. There is no intracranial mass, mass effect, or edema. There is no hydrocephalus or extra-axial fluid collection. Ventricles and sulci are within limits in size and configuration. Patchy T2 hyperintensity in the  supratentorial white matter is nonspecific but may reflect mild chronic microvascular ischemic changes. No abnormal enhancement. Vascular: Major vessel flow voids at the skull base are preserved. Skull and upper cervical spine: Normal marrow signal is preserved. Sinuses/Orbits: Paranasal sinuses are aerated. Orbits are unremarkable. Other: Sella is unremarkable.  Mastoid air cells are clear. IMPRESSION: No evidence of intracranial metastatic disease. Electronically Signed   By: Macy Mis M.D.   On: 11/09/2021 15:00   DG CHEST PORT 1 VIEW  Result Date: 11/09/2021 CLINICAL DATA:  Pleural effusion EXAM: PORTABLE CHEST 1 VIEW COMPARISON:  Chest x-ray dated November 08, 2021 FINDINGS: Cardiac and mediastinal contours are within normal limits. Left hydropneumothorax with interval decreased size of fluid component and increased air component. Stable size of right pleural effusion. Bilateral pleural thickening. IMPRESSION: Left hydropneumothorax with interval decreased size of fluid component and increased air component and minimal re-expansion of the lung. Left-sided chest tube in place. Electronically Signed   By: Yetta Glassman M.D.   On: 11/09/2021 09:08   DG Chest Port 1 View  Result Date: 11/08/2021 CLINICAL DATA:  Chest tube placement EXAM: PORTABLE CHEST 1 VIEW COMPARISON:  Portable exam 1413 hours compared to 11/07/2021 FINDINGS: New pigtail LEFT thoracostomy tube. Small loculated hydropneumothorax LEFT base unchanged. Small RIGHT pleural effusion unchanged. Normal heart size, mediastinal contours, and pulmonary vascularity. Atherosclerotic calcification aorta. Bibasilar atelectasis again noted. IMPRESSION: New LEFT thoracostomy tube with unchanged loculated hydropneumothorax at LEFT lung base. RIGHT pleural effusion and bibasilar atelectasis unchanged. Electronically Signed   By: Lavonia Dana M.D.   On: 11/08/2021 14:26     LOS: 5 days   Oren Binet, MD  [facility]    To contact the  attending provider between 7A-7P or the covering  provider during after hours 7P-7A, please log into the web site [email or web address] and access using universal  password for that web site. If you do not have the password, please call the hospital operator.  11/10/2021, 2:14 PM

## 2021-11-11 DIAGNOSIS — J9601 Acute respiratory failure with hypoxia: Secondary | ICD-10-CM | POA: Diagnosis not present

## 2021-11-12 LAB — MISC LABCORP TEST (SEND OUT): Labcorp test code: 9985

## 2021-11-12 LAB — CULTURE, BODY FLUID W GRAM STAIN -BOTTLE
Culture: NO GROWTH
Special Requests: ADEQUATE

## 2021-11-12 LAB — CHOLESTEROL, BODY FLUID: Cholesterol, Fluid: 59 mg/dL

## 2021-11-13 LAB — BODY FLUID CULTURE W GRAM STAIN
Culture: NO GROWTH
Gram Stain: NONE SEEN

## 2021-11-15 LAB — CYTOLOGY - NON PAP

## 2021-11-16 ENCOUNTER — Other Ambulatory Visit (HOSPITAL_COMMUNITY): Payer: PPO

## 2021-11-16 ENCOUNTER — Ambulatory Visit: Payer: PPO | Admitting: Internal Medicine

## 2021-11-16 ENCOUNTER — Inpatient Hospital Stay: Payer: PPO

## 2021-11-16 ENCOUNTER — Other Ambulatory Visit: Payer: PPO

## 2021-11-16 LAB — CULTURE, FUNGUS WITHOUT SMEAR

## 2021-11-21 ENCOUNTER — Inpatient Hospital Stay: Payer: PPO | Admitting: Physician Assistant

## 2021-11-27 DIAGNOSIS — C3490 Malignant neoplasm of unspecified part of unspecified bronchus or lung: Secondary | ICD-10-CM | POA: Diagnosis not present

## 2021-11-29 NOTE — Progress Notes (Signed)
PROGRESS NOTE        PATIENT DETAILS Name: Rhonda Cook Age: 76 y.o. Sex: female Date of Birth: [birth year] Admit Date: 11/19/2021 Admitting Physician Rolla Plate, DO IEP:[facility], [facility]  Brief Summary: Patient is a 76 y.o.  female who was recently hospitalized for malignant pleural effusion requiring chest tube placement (tube removed on 7/1)-presented to [facility] with shortness of breath-found to have worsening of pleural effusion-and subsequently transferred to [facility] for further evaluation and treatment.    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 7/06>> evaluated by oncologist-Dr. Julien Nordmann in the outpatient setting.  PET scan/MRI brain ordered. 7/8>> admit to [facility] at [facility] for SOB-CT with findings concerning for empyema. 7/10>> s/p thoracocentesis by IR and transfer to [facility] 7/11>> left chest tube placement by PCCM 7/12>> right sided thoracocentesis by PCCM-bloody pleural fluid. 7/12>> SVT requiring cardiology consultation. 7/13>> transition to full comfort measures-left chest tube removed.  Significant studies: 7/8>> CT angio chest: No PE-moderate loculated left pleural effusion with new air-fluid levels: Right pleural effusion.  Significant microbiology data: 7/8>> blood culture: Negative 7/8>> pleural fluid culture: No growth 7/10>> pleural fluid culture: No growth 7/10>> pleural fluid AFB culture: Pending   Procedures: 6/22>> ultrasound-guided thoracocentesis 6/27-7/1>> chest tube placement/removal. 6/30>> bronchoscopy with EBUS 7/10>> thoracocentesis by IR (LDH 1117, WBCs 104-50% neutrophils) 7/11>> left chest tube placement by PCCM 7/12>> right-sided thoracocentesis   Consults: PCCM  Subjective: Having apneic spells-unresponsive-not in distress-spouse at bedside.  Since patient appears to be actively dying-her  respiratory rate is around 6/7 a minute   Objective: Vitals: Blood pressure (!) 64/30, pulse 96, temperature (!) 97.4 F (36.3 C), temperature source Axillary, resp. rate 17, height 5\' 3"  (1.6 m), weight 52.6 kg, SpO2 (!) 80 %.   Exam: Not in distress-comfortable-having apneic spells. Unresponsive.  Pertinent Labs/Radiology:    Latest Ref Rng & Units 11/09/2021   10:29 PM 11/09/2021    6:56 PM 11/09/2021    6:36 AM  CBC  WBC 4.0 - 10.5 K/uL 14.1  10.7  10.7   Hemoglobin 12.0 - 15.0 g/dL 11.2  10.5  10.4   Hematocrit 36.0 - 46.0 % 34.1  31.0  31.5   Platelets 150 - 400 K/uL 350  269  273     Lab Results  Component Value Date   NA 132 (L) 11/09/2021   K 4.3 11/09/2021   CL 101 11/09/2021   CO2 16 (L) 11/09/2021      Assessment/Plan: Acute hypoxic respiratory failure: Due to pleural effusion-was requiring around 2-3 L of oxygen-transition to full comfort measures on 7/13.  Actively dying-having apneic spells-continue comfort measures.  Parapneumonic effusion/empyema superimposed on malignant pleural effusion: Based off CT imaging-s/p left-sided chest tube insertion on 7/11 and 1 round of lytic therapy.  Subsequently underwent right-sided thoracocentesis-bloody fluid drained.  In spite of having pleural fluid drained-patient continued to slowly deteriorate-was in significant amount of pain-nauseous-after extensive discussion with patient/family-she was transitioned to full comfort measures on 7/13.  Chest tube was removed by PCCM on 7/13.  All antibiotics were also discontinued.   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 Satira Anis persistent pain/nausea-see above.   HTN: BP stable-off all antihypertensives-no plans to resume given comfort measures.  HLD: Stopping statin-no  plans to resume given comfort measures.  Hypothyroidism: Stopping Synthroid-no plans to resume given comfort  measures.  GERD: Stop Pepcid-no plans to resume given comfort measures.  History of bladder cancer-s/p chemotherapy: Followed by urologist-Dr. Gilford Rile.  Palliative care: DNR in place.  Long discussion with spouse and patient at bedside on 7/13-they do not see any significant functional improvement-as she continued to have nausea/poor appetite/pain-even after drainage of her pleural effusion.  After extensive discussion-she was transitioned to full comfort measures and discharge was planned to residential hospice.  However this morning she appears to be actively dying-respiratory rate is around 6-having apneic spells-discussed with spouse-we both think it is reasonable to not transfer her in this situation to residential hospice.  Suspect her death is imminent in a matter of few hours to a day or so.  Continue with full comfort measures-remains on Dilaudid infusion.   Nutrition Status: Nutrition Problem: Severe Malnutrition Etiology: cancer and cancer related treatments Signs/Symptoms: moderate fat depletion, severe fat depletion, moderate muscle depletion, severe muscle depletion, mild muscle depletion, mild fat depletion Interventions: Ensure Enlive (each supplement provides 350kcal and 20 grams of protein), Magic cup, MVI   BMI: Estimated body mass index is 20.55 kg/m as calculated from the following:   Height as of this encounter: 5\' 3"  (1.6 m).   Weight as of this encounter: 52.6 kg.   Code status:   Code Status: DNR   DVT Prophylaxis:    Family Communication: Spouse at bedside.   Disposition Plan: Status is: Inpatient Remains inpatient appropriate because: Actively dying-do not think she is stable to transfer to residential hospice at this point.   Planned Discharge Destination: Inpatient death   Diet: Diet Order             Diet regular Room service appropriate? Yes; Fluid consistency: Thin  Diet effective now                     Antimicrobial  agents: Anti-infectives (From admission, onward)    Start     Dose/Rate Route Frequency Ordered Stop   11/07/21 1300  metroNIDAZOLE (FLAGYL) IVPB 500 mg  Status:  Discontinued        500 mg 100 mL/hr over 60 Minutes Intravenous Every 12 hours 11/07/21 1224 11/10/21 0927   11/06/21 2200  vancomycin (VANCOREADY) IVPB 750 mg/150 mL  Status:  Discontinued        750 mg 150 mL/hr over 60 Minutes Intravenous Every 24 hours 11/12/2021 2103 11/07/21 1019   11/06/21 1200  ceFEPIme (MAXIPIME) 2 g in sodium chloride 0.9 % 100 mL IVPB  Status:  Discontinued        2 g 200 mL/hr over 30 Minutes Intravenous Every 12 hours 11/06/21 0851 11/10/21 0927   11/06/21 0600  aztreonam (AZACTAM) 1 g in sodium chloride 0.9 % 100 mL IVPB  Status:  Discontinued        1 g 200 mL/hr over 30 Minutes Intravenous Every 8 hours 11/20/2021 2107 11/06/21 0823   11/04/2021 2045  vancomycin (VANCOCIN) IVPB 1000 mg/200 mL premix        1,000 mg 200 mL/hr over 60 Minutes Intravenous  Once 11/04/2021 2031 11/06/21 0021   11/27/2021 2030  aztreonam (AZACTAM) 2 g in sodium chloride 0.9 % 100 mL IVPB        2 g 200 mL/hr over 30 Minutes Intravenous  Once 11/06/2021 2026 11/28/2021 2131        MEDICATIONS: Scheduled Meds:  sodium chloride flush  3 mL Intravenous Q12H   Continuous Infusions:  sodium chloride     chlorproMAZINE (THORAZINE) 12.5 mg in sodium chloride 0.9 % 25 mL IVPB     HYDROmorphone 1 mg/hr (12/07/2021 0327)   promethazine (PHENERGAN) injection (IM or IVPB)     PRN Meds:.sodium chloride, acetaminophen **OR** acetaminophen, albuterol, antiseptic oral rinse, chlorproMAZINE (THORAZINE) 12.5 mg in sodium chloride 0.9 % 25 mL IVPB, diphenhydrAMINE, glycopyrrolate **OR** glycopyrrolate **OR** glycopyrrolate, haloperidol **OR** haloperidol **OR** haloperidol lactate, HYDROmorphone, loperamide, LORazepam **OR** LORazepam **OR** LORazepam, LORazepam, magic mouthwash w/lidocaine, nystatin, oxybutynin, promethazine (PHENERGAN)  injection (IM or IVPB), sodium chloride flush, tetrahydrozoline   I have personally reviewed following labs and imaging studies  LABORATORY DATA: CBC: Recent Labs  Lab 11/04/2021 1607 11/06/21 0716 11/08/21 0307 11/09/21 0636 11/09/21 1856 11/09/21 2229  WBC 11.8* 12.5* 12.2* 10.7* 10.7* 14.1*  NEUTROABS 10.3* 11.7*  --   --   --   --   HGB 11.9* 11.1* 10.1* 10.4* 10.5* 11.2*  HCT 35.4* 33.1* 31.0* 31.5* 31.0* 34.1*  MCV 92.4 93.0 93.7 92.6 90.9 93.7  PLT 284 253 255 273 269 350     Basic Metabolic Panel: Recent Labs  Lab 11/06/21 0716 11/07/21 0559 11/08/21 0307 11/09/21 0636 11/09/21 2229  NA 132* 134* 133* 132* 132*  K 3.2* 3.8 4.0 4.0 4.3  CL 99 103 97* 100 101  CO2 22 24 21* 21* 16*  GLUCOSE 93 112* 126* 86 92  BUN 19 22 22 19 18   CREATININE 0.64 0.58 0.61 0.68 0.55  CALCIUM 8.1* 8.2* 8.5* 8.3* 8.2*  MG 1.9 1.9  --   --   --      GFR: Estimated Creatinine Clearance: 50.3 mL/min (by C-G formula based on SCr of 0.55 mg/dL).  Liver Function Tests: Recent Labs  Lab 11/13/2021 1607 11/06/21 0716 11/08/21 0307 11/09/21 0636  AST 16 13*  --  13*  ALT 20 18  --  14  ALKPHOS 84 72  --  60  BILITOT 0.6 0.7  --  0.5  PROT 5.8* 5.2*  --  4.8*  ALBUMIN 2.7* 2.3* 2.0* 2.0*    No results for input(s): "LIPASE", "AMYLASE" in the last 168 hours. No results for input(s): "AMMONIA" in the last 168 hours.  Coagulation Profile: Recent Labs  Lab 11/08/21 0909  INR 1.1     Cardiac Enzymes: No results for input(s): "CKTOTAL", "CKMB", "CKMBINDEX", "TROPONINI" in the last 168 hours.  BNP (last 3 results) No results for input(s): "PROBNP" in the last 8760 hours.  Lipid Profile: No results for input(s): "CHOL", "HDL", "LDLCALC", "TRIG", "CHOLHDL", "LDLDIRECT" in the last 72 hours.  Thyroid Function Tests: Recent Labs    11/09/21 2229  TSH 8.237*  FREET4 1.54*     Anemia Panel: No results for input(s): "VITAMINB12", "FOLATE", "FERRITIN", "TIBC", "IRON",  "RETICCTPCT" in the last 72 hours.  Urine analysis:    Component Value Date/Time   COLORURINE STRAW (A) 10/19/2021 1825   APPEARANCEUR CLEAR 10/19/2021 1825   LABSPEC 1.043 (H) 10/19/2021 1825   PHURINE 7.0 10/19/2021 1825   GLUCOSEU NEGATIVE 10/19/2021 1825   HGBUR NEGATIVE 10/19/2021 1825   BILIRUBINUR NEGATIVE 10/19/2021 1825   KETONESUR 20 (A) 10/19/2021 1825   PROTEINUR NEGATIVE 10/19/2021 1825   NITRITE NEGATIVE 10/19/2021 1825   LEUKOCYTESUR NEGATIVE 10/19/2021 1825    Sepsis Labs: Lactic Acid, Venous    Component Value Date/Time   LATICACIDVEN 1.2 11/04/2021 1806    MICROBIOLOGY: Recent Results (from the past 240 hour(s))  MRSA Next Gen by PCR, Nasal     Status: None   Collection Time: 11/23/2021  9:01 PM   Specimen: Nasal Mucosa; Nasal Swab  Result Value Ref Range Status   MRSA by PCR Next Gen NOT DETECTED NOT DETECTED Final    Comment: (NOTE) The GeneXpert MRSA Assay (FDA approved for NASAL specimens only), is one component of a comprehensive MRSA colonization surveillance program. It is not intended to diagnose MRSA infection nor to guide or monitor treatment for MRSA infections. Test performance is not FDA approved in patients less than 53 years old. Performed at [facility], [address], [place], [place] [address]   Culture, blood (Routine X 2) w Reflex to ID Panel     Status: None   Collection Time: 11/19/2021  9:41 PM   Specimen: BLOOD LEFT HAND  Result Value Ref Range Status   Specimen Description BLOOD LEFT HAND  Final   Special Requests   Final    BOTTLES DRAWN AEROBIC ONLY Blood Culture adequate volume   Culture   Final    NO GROWTH 5 DAYS Performed at [facility], [address] [place], [place] [address]    Report Status 11/10/2021 FINAL  Final  Culture, blood (Routine X 2) w Reflex to ID Panel     Status: None   Collection Time: 11/19/2021  9:50 PM   Specimen: BLOOD RIGHT HAND  Result Value Ref Range Status   Specimen Description BLOOD RIGHT  HAND  Final   Special Requests   Final    BOTTLES DRAWN AEROBIC ONLY Blood Culture adequate volume   Culture   Final    NO GROWTH 5 DAYS Performed at [facility], [address], [place], [place] [address]    Report Status 11/10/2021 FINAL  Final  Culture, body fluid w Gram Stain-bottle     Status: None (Preliminary result)   Collection Time: 11/07/21  1:06 PM   Specimen: Pleura  Result Value Ref Range Status   Specimen Description PLEURAL  Final   Special Requests   Final    Blood Culture adequate volume BOTTLES DRAWN AEROBIC AND ANAEROBIC   Culture   Final    NO GROWTH 4 DAYS Performed at [facility], [address], [place], [place] [address]    Report Status PENDING  Incomplete  Gram stain     Status: None   Collection Time: 11/07/21  1:06 PM   Specimen: Pleura  Result Value Ref Range Status   Specimen Description PLEURAL  Final   Special Requests NONE  Final   Gram Stain   Final    NO ORGANISMS SEEN WBC PRESENT, PREDOMINANTLY MONONUCLEAR CYTOSPIN SMEAR Performed at [facility], [address] [place], [place] [address]    Report Status 11/07/2021 FINAL  Final  Body fluid culture w Gram Stain     Status: None (Preliminary result)   Collection Time: 11/09/21  4:00 PM   Specimen: Pleural Fluid  Result Value Ref Range Status   Specimen Description PLEURAL  Final   Special Requests RIGHT  Final   Gram Stain NO WBC SEEN NO ORGANISMS SEEN   Final   Culture   Final    NO GROWTH 2 DAYS Performed at [facility], [place] [address], [place],  [address]    Report Status PENDING  Incomplete    RADIOLOGY STUDIES/RESULTS: DG CHEST PORT 1 VIEW  Result Date: 11/09/2021 CLINICAL DATA:  Supraventricular tachycardia. EXAM: PORTABLE CHEST 1 VIEW COMPARISON:  Chest  x-ray 11/09/2021 FINDINGS: Left-sided chest tube is unchanged in position. Small left hydropneumothorax is unchanged. Small right pleural effusion is unchanged. There is central pulmonary vascular congestion and  central interstitial prominence similar to prior study. Cardiomediastinal silhouette is stable. No acute fractures are seen. IMPRESSION: 1. No significant interval change in left-sided hydropneumothorax. 2. Stable small right pleural effusion with central opacities favored as edema. Electronically Signed   By: Ronney Asters M.D.   On: 11/09/2021 22:37   DG CHEST PORT 1 VIEW  Result Date: 11/09/2021 CLINICAL DATA:  Post thoracentesis EXAM: PORTABLE CHEST 1 VIEW COMPARISON:  Chest x-ray 11/09/2021 FINDINGS: Left-sided chest tube is unchanged in position. Small left hydropneumothorax is unchanged. There is a small right pleural effusion which has decreased. There is no right-sided pneumothorax. No new focal lung infiltrate. Cardiomediastinal silhouette within normal limits. No acute fractures. IMPRESSION: 1. Small right pleural effusion has decreased.  No pneumothorax. 2. Stable small left-sided hydropneumothorax with chest tube in place. Electronically Signed   By: Ronney Asters M.D.   On: 11/09/2021 17:20   MR BRAIN W WO CONTRAST  Result Date: 11/09/2021 CLINICAL DATA:  Non-small cell lung cancer (NSCLC), staging EXAM: MRI HEAD WITHOUT AND WITH CONTRAST TECHNIQUE: Multiplanar, multiecho pulse sequences of the brain and surrounding structures were obtained without and with intravenous contrast. CONTRAST:  73mL GADAVIST GADOBUTROL 1 MMOL/ML IV SOLN COMPARISON:  None Available. FINDINGS: Brain: There is no acute infarction or intracranial hemorrhage. There is no intracranial mass, mass effect, or edema. There is no hydrocephalus or extra-axial fluid collection. Ventricles and sulci are within limits in size and configuration. Patchy T2 hyperintensity in the supratentorial white matter is nonspecific but may reflect mild chronic microvascular ischemic changes. No abnormal enhancement. Vascular: Major vessel flow voids at the skull base are preserved. Skull and upper cervical spine: Normal marrow signal is  preserved. Sinuses/Orbits: Paranasal sinuses are aerated. Orbits are unremarkable. Other: Sella is unremarkable.  Mastoid air cells are clear. IMPRESSION: No evidence of intracranial metastatic disease. Electronically Signed   By: Macy Mis M.D.   On: 11/09/2021 15:00     LOS: 6 days   Oren Binet, MD  [facility]    To contact the attending provider between 7A-7P or the covering provider during after hours 7P-7A, please log into the web site [email or web address] and access using universal [place] password for that web site. If you do not have the password, please call the hospital operator.  12-05-21, 1:31 PM

## 2021-11-29 NOTE — Progress Notes (Signed)
Witnessed nurse Cathlean Cower RN waste 30 ml's of dilaudid

## 2021-11-29 NOTE — Progress Notes (Signed)
Wasted the remainder of IV dilaudid infusion 30 mls in stericycle.Patient was no longer listed in pyxis. Witnessed by America Brown, RN.

## 2021-11-29 NOTE — Death Summary Note (Signed)
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  female who was recently hospitalized for malignant pleural effusion requiring chest tube placement (tube removed on 7/1)-presented to [facility] with shortness of breath-found to have worsening of pleural effusion-and subsequently transferred to [facility] for further evaluation and treatment.      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 7/06>> evaluated by oncologist-Dr. Julien Nordmann in the outpatient setting.  PET scan/MRI brain ordered. 7/8>> admit to [facility] at [facility] for SOB-CT with findings concerning for empyema. 7/10>> s/p thoracocentesis by IR and transfer to [facility] 7/11>> left chest tube placement by PCCM 7/12>> right sided thoracocentesis by PCCM-bloody pleural fluid. 7/12>> SVT requiring cardiology consultation. 7/13>> transition to full comfort measures-left chest tube removed.   Significant studies: 7/8>> CT angio chest: No PE-moderate loculated left pleural effusion with new air-fluid levels: Right pleural effusion.   Significant microbiology data: 7/8>> blood  culture: Negative 7/8>> pleural fluid culture: No growth 7/10>> pleural fluid culture: No growth 7/10>> pleural fluid AFB culture: Pending    Procedures: 6/22>> ultrasound-guided thoracocentesis 6/27-7/1>> chest tube placement/removal. 6/30>> bronchoscopy with EBUS 7/10>> thoracocentesis by IR (LDH 1117, WBCs 104-50% neutrophils) 7/11>> left chest tube placement by PCCM 7/12>> right-sided thoracocentesis   Consultants: Palliative care PCCM  Assessment and Plan: Acute hypoxic respiratory failure: Due to worsening malignant pleural effusion/parapneumonic effusion-continued to have worsening respiratory distress in spite of having a chest tube-after extensive discussion with family-transition to full comfort measures.     Parapneumonic effusion/empyema superimposed on malignant pleural effusion: Based off CT imaging-s/p left-sided chest tube insertion on 7/11 and 1 round of lytic therapy.  Subsequently underwent right-sided thoracocentesis-bloody fluid drained.  In spite of having pleural fluid drained-patient continued to slowly deteriorate-was in significant amount of respiratory distress-pain-nausea-after extensive discussion with family-she was transitioned to full comfort measures on 7/13.  Chest tube was removed on 7/13.  All antibiotics were discontinued accordingly.     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 Satira Anis persistent pain/nausea-see above.    HTN: BP stable-off all antihypertensives given comfort care.  HLD: No longer on statins given comfort care.  Hypothyroidism: Synthroid was stopped as comfort measures were started.  GERD: Pepcid was stopped-as comfort measures were started  History of bladder cancer-s/p chemotherapy: Followed by urologist-Dr. Gilford Rile.   Palliative care: DNR in place.  Long discussion with spouse and patient at bedside on 7/13-they do not see any  significant functional improvement-as she continued to have nausea/poor appetite/pain-even after drainage of her pleural effusion.  After extensive discussion-she was transitioned to full comfort measures and discharge was planned to residential hospice.  However  on 7/14-she was actively dying-and after discussion with spouse-we decided that she was too unstable for transfer to residential hospice-she was continued on full comfort measures-till she passed away    The results of significant diagnostics from this hospitalization (including imaging, microbiology, ancillary and laboratory) are listed below for reference.   Significant Diagnostic Studies: DG CHEST PORT 1 VIEW  Result Date: 11/09/2021 CLINICAL DATA:  Supraventricular tachycardia. EXAM: PORTABLE CHEST 1 VIEW COMPARISON:  Chest x-ray 11/09/2021 FINDINGS: Left-sided chest tube is unchanged in position. Small left hydropneumothorax is unchanged. Small right pleural effusion is unchanged. There is central pulmonary vascular congestion and central interstitial prominence similar to prior study. Cardiomediastinal silhouette is stable. No acute fractures are seen. IMPRESSION: 1. No significant interval change in left-sided hydropneumothorax. 2. Stable small right pleural effusion with central opacities favored as edema. Electronically Signed   By: Ronney Asters M.D.   On: 11/09/2021 22:37   DG CHEST PORT 1 VIEW  Result Date: 11/09/2021 CLINICAL DATA:  Post thoracentesis EXAM: PORTABLE CHEST 1 VIEW COMPARISON:  Chest x-ray 11/09/2021 FINDINGS: Left-sided chest tube is unchanged in position. Small left hydropneumothorax is unchanged. There is a small right pleural effusion which has decreased. There is no right-sided pneumothorax. No new focal lung infiltrate. Cardiomediastinal silhouette within normal limits. No acute fractures. IMPRESSION: 1. Small right pleural effusion has decreased.  No pneumothorax. 2. Stable small left-sided hydropneumothorax  with chest tube in place. Electronically Signed   By: Ronney Asters M.D.   On: 11/09/2021 17:20   MR BRAIN W WO CONTRAST  Result Date: 11/09/2021 CLINICAL DATA:  Non-small cell lung cancer (NSCLC), staging EXAM: MRI HEAD WITHOUT AND WITH CONTRAST TECHNIQUE: Multiplanar, multiecho pulse sequences of the brain and surrounding structures were obtained without and with intravenous contrast. CONTRAST:  66mL GADAVIST GADOBUTROL 1 MMOL/ML IV SOLN COMPARISON:  None Available. FINDINGS: Brain: There is no acute infarction or intracranial hemorrhage. There is no intracranial mass, mass effect, or edema. There is no hydrocephalus or extra-axial fluid collection. Ventricles and sulci are within limits in size and configuration. Patchy T2 hyperintensity in the supratentorial white matter is nonspecific but may reflect mild chronic microvascular ischemic changes. No abnormal enhancement. Vascular: Major vessel flow voids at the skull base are preserved. Skull and upper cervical spine: Normal marrow signal is preserved. Sinuses/Orbits: Paranasal sinuses are aerated. Orbits are unremarkable. Other: Sella is unremarkable.  Mastoid air cells are clear. IMPRESSION: No evidence of intracranial metastatic disease. Electronically Signed   By: Macy Mis M.D.   On: 11/09/2021 15:00   DG CHEST PORT 1 VIEW  Result Date: 11/09/2021 CLINICAL DATA:  Pleural effusion EXAM: PORTABLE CHEST 1 VIEW COMPARISON:  Chest x-ray dated November 08, 2021 FINDINGS: Cardiac and mediastinal contours are within normal limits. Left hydropneumothorax with interval decreased size of fluid component and increased air component. Stable size of right pleural effusion. Bilateral pleural thickening. IMPRESSION: Left hydropneumothorax with interval decreased size of fluid component and increased air component and minimal re-expansion of the lung. Left-sided chest tube in place. Electronically Signed   By: Yetta Glassman M.D.   On: 11/09/2021 09:08   DG Chest  Port 1 View  Result Date: 11/08/2021 CLINICAL DATA:  Chest tube placement EXAM: PORTABLE CHEST 1 VIEW COMPARISON:  Portable exam 1413 hours compared to 11/07/2021 FINDINGS: New pigtail LEFT thoracostomy tube. Small loculated hydropneumothorax LEFT base unchanged. Small RIGHT pleural effusion unchanged. Normal heart size, mediastinal contours, and pulmonary vascularity. Atherosclerotic calcification aorta. Bibasilar atelectasis again noted. IMPRESSION:  New LEFT thoracostomy tube with unchanged loculated hydropneumothorax at LEFT lung base. RIGHT pleural effusion and bibasilar atelectasis unchanged. Electronically Signed   By: Lavonia Dana M.D.   On: 11/08/2021 14:26   DG Chest Port 1 View  Result Date: 11/07/2021 CLINICAL DATA:  Follow-up LEFT pneumothorax post thoracentesis EXAM: PORTABLE CHEST 1 VIEW COMPARISON:  Portable exam 1659 hours compared to earlier study of 1322 hours FINDINGS: Normal heart size mediastinal contours with atherosclerotic calcification aorta. Accentuation of interstitial markings unchanged with bibasilar pleural effusions and atelectasis. Small loculated pneumothorax at lateral inferior LEFT chest unchanged. No mediastinal shift. Bones demineralized. IMPRESSION: Persistent/stable loculated LEFT basilar hydropneumothorax. Small RIGHT pleural effusion with bibasilar atelectasis and persistent accentuation of interstitial markings. Electronically Signed   By: Lavonia Dana M.D.   On: 11/07/2021 17:13   DG Chest 1 View  Result Date: 11/07/2021 CLINICAL DATA:  Post LEFT thoracentesis EXAM: CHEST  1 VIEW COMPARISON:  11/10/2021 FINDINGS: Small loculated LEFT basilar pneumothorax post thoracentesis. Bibasilar pleural effusions and atelectasis remain. Stable heart size and mediastinal contours with atherosclerotic calcification aorta. No osseous abnormalities. IMPRESSION: Small loculated hydropneumothorax at LEFT base post thoracentesis. Patient asymptomatic, reports breathing improved since  procedure. Follow-up chest radiograph ordered. Electronically Signed   By: Lavonia Dana M.D.   On: 11/07/2021 14:04   US THORACENTESIS ASP PLEURAL SPACE W/IMG GUIDE  Result Date: 11/07/2021 INDICATION: Lung cancer, malignant pleural effusion, shortness of breath, pain, vomiting, question empyema EXAM: ULTRASOUND GUIDED DIAGNOSTIC LEFT THORACENTESIS MEDICATIONS: None. COMPLICATIONS: None immediate. PROCEDURE: An ultrasound guided thoracentesis was thoroughly discussed with the patient and questions answered. The benefits, risks, alternatives and complications were also discussed. The patient understands and wishes to proceed with the procedure. Written consent was obtained. Ultrasound was performed to localize and mark an adequate pocket of fluid in the LEFT chest. The area was then prepped and draped in the normal sterile fashion. 1% Lidocaine was used for local anesthesia. Under ultrasound guidance a 6 Fr Safe-T-Centesis catheter was introduced. Thoracentesis was performed. The catheter was removed and a dressing applied. FINDINGS: A total of approximately 270 mL of amber colored fluid was removed. Samples were sent to the laboratory as requested by the clinical team. IMPRESSION: Successful ultrasound guided diagnostic left thoracentesis yielding 270 mL of pleural fluid. Electronically Signed   By: Lavonia Dana M.D.   On: 11/07/2021 14:01   CT Angio Chest PE W/Cm &/Or Wo Cm  Result Date: 11/14/2021 CLINICAL DATA:  High probability for pulmonary embolism. Shortness of breath with pain and vomiting. History of bladder cancer. EXAM: CT ANGIOGRAPHY CHEST WITH CONTRAST TECHNIQUE: Multidetector CT imaging of the chest was performed using the standard protocol during bolus administration of intravenous contrast. Multiplanar CT image reconstructions and MIPs were obtained to evaluate the vascular anatomy. RADIATION DOSE REDUCTION: This exam was performed according to the departmental dose-optimization program which  includes automated exposure control, adjustment of the mA and/or kV according to patient size and/or use of iterative reconstruction technique. CONTRAST:  61mL OMNIPAQUE IOHEXOL 350 MG/ML SOLN COMPARISON:  CT chest 05/21/2021 FINDINGS: Cardiovascular: Satisfactory opacification of the pulmonary arteries to the segmental level. No evidence of pulmonary embolism. Normal heart size. No pericardial effusion. There are atherosclerotic calcifications of the aorta. Mediastinum/Nodes: There is an enlarged precarinal lymph node measuring 11 mm short axis. No other enlarged lymph nodes are identified. Thyroid gland is not visualized on this study. Esophagus is nondilated. Lungs/Pleura: Moderate size loculated left pleural effusion is again seen similar in size, there  are new air-fluid levels throughout this effusion. There is diffuse left pleural thickening which appears similar to prior. Small right pleural effusion appears similar to the prior study, mildly loculated. Emphysematous changes are again seen. There is compressive atelectasis of the bilateral lower lobes similar to the prior study. Spiculated nodular density in the left lung apex measuring 1.4 by 1.0 cm image 6/25 appears unchanged from the prior study. Additional anterior left upper lobe nodule measuring 13 mm appears slightly smaller. Pleural-based and fissural based nodules throughout the right lung are unchanged. Trachea and central airways are patent. Additional smaller nodular densities in the left upper lobe measuring up to 6 mm appear unchanged. Upper Abdomen: Nodularity of the left adrenal unchanged. Musculoskeletal: There is left paraspinal muscular thickening measuring up to 2.6 cm in thickness axial image 4/113. This is incompletely imaged and appears similar to prior. No acute fractures are seen. Review of the MIP images confirms the above findings. IMPRESSION: 1. No pulmonary embolism identified. 2. Moderate-sized loculated left pleural effusion  now contains multiple air-fluid levels. Findings are concerning for empyema. 3. Stable loculated right pleural effusion. 4. Single left upper lobe nodule has mildly decreased in size. Nodular densities are otherwise stable in both lungs with dominant spiculated nodule in the left lung apex. 5. Stable left paraspinous soft tissue thickening worrisome for metastatic disease. 6. Stable enlarged precarinal lymph node. Electronically Signed   By: Ronney Asters M.D.   On: 11/26/2021 19:16   DG Chest 2 View  Result Date: 11/10/2021 CLINICAL DATA:  Back and hip pain. EXAM: CHEST - 2 VIEW COMPARISON:  10/29/2021 FINDINGS: The cardiopericardial silhouette is within normal limits for size. Chronic left effusion noted with multiple air-fluid levels. Small right pleural effusion evident. Interstitial markings are diffusely coarsened with chronic features. Bones are diffusely demineralized. IMPRESSION: 1. Chronic left effusion with multiple air-fluid levels. Patient had a left chest tube previously. Superinfection of the loculated left pleural fluid cannot be excluded. 2. Small right pleural effusion. Electronically Signed   By: Misty Stanley M.D.   On: 11/28/2021 17:02   DG Lumbar Spine 2-3 Views  Result Date: 11/16/2021 CLINICAL DATA:  Back pain. EXAM: LUMBAR SPINE - 2-3 VIEW COMPARISON:  None Available. FINDINGS: No evidence for an acute fracture. No subluxation. Loss of disc height noted L2-3 and L3-4. No worrisome lytic or sclerotic osseous abnormality. IMPRESSION: Degenerative changes without acute bony findings. Electronically Signed   By: Misty Stanley M.D.   On: 11/23/2021 17:00   DG Chest Port 1 View  Result Date: 10/29/2021 CLINICAL DATA:  Pt c/o sob after having chest tube removed from the left side of her chest within the last hour. States she is beginning to calm down after having it removed but still feels a little sob. EXAM: PORTABLE CHEST 1 VIEW COMPARISON:  10/26/2021 and older studies.  CT, 10/19/2021.  FINDINGS: Left-sided pigtail chest tube has been removed. There is a small left inferolateral hemithorax pneumothorax at the site of the prior chest tube. There is surrounding opacity at the left lung base obscuring the hemidiaphragm consistent with a combination of residual pleural fluid and atelectasis, decreased from the prior exam. No apical pneumothorax. Right lung is hyperexpanded. The opacity in the right lower lung has decreased consistent with a decrease in pleural fluid and atelectasis. Other small areas of hazy airspace opacity persist. No new right lung abnormalities. No right pneumothorax. IMPRESSION: 1. Status post removal of the left inferior me thorax chest tube. 2. Small loculated  left lateral and inferior pneumothorax. 3. Decreased opacity at the left lung base from the most recent prior exam. Residual opacity consistent with a combination of a small residual effusion and atelectasis. 4. Improved aeration in the right lung base consistent with a decrease in right pleural fluid and atelectasis. Electronically Signed   By: Lajean Manes M.D.   On: 10/29/2021 18:46   DG CHEST PORT 1 VIEW  Result Date: 10/26/2021 CLINICAL DATA:  Left pleural effusion.  Chest tube. EXAM: PORTABLE CHEST 1 VIEW COMPARISON:  Chest radiograph October 20, 2021. FINDINGS: Similar moderate left pleural effusion with left chest tube in place. No visible pneumothorax. Similar small right pleural effusion. Similar overlying bibasilar opacities. Unchanged cardiomediastinal silhouette, partially obscured. Cholecystectomy clips. IMPRESSION: 1. Similar moderate left pleural effusion with left chest tube in place. No visible pneumothorax. 2. Similar small right pleural effusion. 3. Similar overlying bibasilar atelectasis and/or consolidation. Electronically Signed   By: Margaretha Sheffield M.D.   On: 10/26/2021 08:12   IR PERC PLEURAL DRAIN W/INDWELL CATH W/IMG GUIDE  Result Date: 10/25/2021 INDICATION: 76 year old with loculated  left pleural effusion. Request for chest tube placement. EXAM: PLACEMENT OF LEFT CHEST TUBE WITH ULTRASOUND AND FLUOROSCOPIC GUIDANCE MEDICATIONS: Moderate sedation ANESTHESIA/SEDATION: Moderate (conscious) sedation was employed during this procedure. A total of Versed 2.5 mg and Fentanyl 100 mcg was administered intravenously by the radiology nurse. Total intra-service moderate Sedation Time: 23 minutes. The patient's level of consciousness and vital signs were monitored continuously by radiology nursing throughout the procedure under my direct supervision. COMPLICATIONS: None immediate. PROCEDURE: Informed written consent was obtained from the patient after a thorough discussion of the procedural risks, benefits and alternatives. All questions were addressed.A timeout was performed prior to the initiation of the procedure. Patient was rolled onto her right side. The left mid axillary region was prepped and draped in sterile fashion. Maximal barrier sterile technique was utilized including caps, mask, sterile gowns, sterile gloves, sterile drape, hand hygiene and skin antiseptic. Ultrasound demonstrated a loculated left pleural effusion. Skin was anesthetized with 1% lidocaine. A small incision was made. Using ultrasound guidance, a Yueh catheter was directed into the loculated fluid and dark red pleural fluid was aspirated. Superstiff Amplatz wire was advanced into the pleural space. The tract was dilated to accommodate a 14 [place] multipurpose drain. Additional dark red fluid was collected for analysis. Drain was sutured to skin and attached to a chest tube drainage system. Bandage was placed. Fluoroscopic and ultrasound images were taken and saved for documentation. FINDINGS: Left pleural fluid is very loculated with multiple septations. 25 French drain was advanced into the pleural space and dark red fluid was removed. IMPRESSION: Placement of left chest tube using ultrasound and fluoroscopic guidance. Left  pleural effusion is diffusely loculated. Electronically Signed   By: Markus Daft M.D.   On: 10/25/2021 17:31   US THORACENTESIS ASP PLEURAL SPACE W/IMG GUIDE  Result Date: 10/20/2021 INDICATION: Left loculated pleural effusion EXAM: ULTRASOUND GUIDED LEFT THORACENTESIS MEDICATIONS: 10 cc 1% lidocaine. COMPLICATIONS: None immediate. PROCEDURE: An ultrasound guided thoracentesis was thoroughly discussed with the patient and questions answered. The benefits, risks, alternatives and complications were also discussed. The patient understands and wishes to proceed with the procedure. Written consent was obtained. Ultrasound was performed to localize and mark an adequate pocket of fluid in the left chest. The area was then prepped and draped in the normal sterile fashion. 1% Lidocaine was used for local anesthesia. Under ultrasound guidance a Yueh catheter was introduced.  Thoracentesis was performed. The catheter was removed and a dressing applied. FINDINGS: A total of approximately 240 cc of bloody fluid was removed. Samples were sent to the laboratory as requested by the clinical team. IMPRESSION: Successful ultrasound guided left thoracentesis yielding of pleural fluid. CXR pending Read by Lavonia Drafts [facility] Electronically Signed   By: Lavonia Dana M.D.   On: 10/20/2021 12:07   DG Chest 1 View  Result Date: 10/20/2021 CLINICAL DATA:  Post left thoracentesis. EXAM: CHEST  1 VIEW COMPARISON:  10/19/2021 FINDINGS: 0923 hours. Interval decrease left pleural effusion without evidence for left-sided pneumothorax. Small right pleural effusion is similar. There is persistent bibasilar collapse/consolidation. Pleural base nodular opacities in the right lung evident, as seen on recent chest CT. Stable cardiopericardial silhouette. The visualized bony structures of the thorax are unremarkable. IMPRESSION: No evidence for pneumothorax after left thoracentesis with decrease in left pleural fluid collection. Electronically  Signed   By: Misty Stanley M.D.   On: 10/20/2021 10:05   [place] CHEST (PLEURAL EFFUSION)  Result Date: 10/19/2021 CLINICAL DATA:  Pleural effusions EXAM: CHEST ULTRASOUND COMPARISON:  Chest radiograph 10/19/2021 FINDINGS: Highly loculated moderate-sized LEFT pleural effusion identified with numerous septations and scattered debris. Small uncomplicated appearing RIGHT pleural effusion. IMPRESSION: Moderate-sized LEFT pleural effusion which is highly loculated by numerous septations. Small RIGHT simple appearing RIGHT pleural effusion. Electronically Signed   By: Lavonia Dana M.D.   On: 10/19/2021 14:59   CT ABDOMEN PELVIS W CONTRAST  Result Date: 10/19/2021 CLINICAL DATA:  Metastatic disease evaluation, abnormal chest CT, known bladder cancer * Tracking Code: BO * EXAM: CT ABDOMEN AND PELVIS WITH CONTRAST TECHNIQUE: Multidetector CT imaging of the abdomen and pelvis was performed using the standard protocol following bolus administration of intravenous contrast. RADIATION DOSE REDUCTION: This exam was performed according to the departmental dose-optimization program which includes automated exposure control, adjustment of the mA and/or kV according to patient size and/or use of iterative reconstruction technique. CONTRAST:  85mL OMNIPAQUE IOHEXOL 300 MG/ML  SOLN COMPARISON:  CT chest, 10/19/2021, CT abdomen pelvis, 05/13/2018 FINDINGS: Lower chest: Large left, moderate right pleural effusions and associated pleural thickening and nodularity. Please see prior same day CT chest. Hepatobiliary: No focal liver abnormality is seen. Status post cholecystectomy. Mild postop biliary dilatation. Pancreas: Atrophic appearance of the pancreatic tail with some adjacent fat stranding (series 2, image 27). No pancreatic ductal dilatation. Spleen: Normal in size without significant abnormality. Adrenals/Urinary Tract: Stable, definitively benign left adrenal adenomata (series 2, image 23). Kidneys are normal, without renal  calculi, solid lesion, or hydronephrosis. Bladder is unremarkable. Stomach/Bowel: Stomach is within normal limits. Appendix is not clearly visualized. No evidence of bowel wall thickening, distention, or inflammatory changes. Descending and sigmoid diverticulosis. Vascular/Lymphatic: Aortic atherosclerosis. 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 No enlarged abdominal or pelvic lymph nodes. Reproductive: No mass or other significant abnormality. Other: No abdominal wall hernia or abnormality. Small volume ascites in the low pelvis (series 2, image 70). Musculoskeletal: No acute or significant osseous findings. IMPRESSION: 1. No evidence of metastatic disease in the abdomen or pelvis. 2. Stable, definitively benign left adrenal adenomata, unchanged in comparison to examination dated 05/13/2018 and corresponding to queried paraspinous soft tissue thickening seen by prior examination of the chest. 3. Atrophic appearance of the pancreatic tail with some adjacent fat stranding, most consistent with sequelae of pancreatitis seen acutely on prior examination dated 05/13/2018. 4. 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 5.  Small volume ascites in the low pelvis. 6. Please see separately reported same day examination of the chest for complete findings. Aortic Atherosclerosis (ICD10-I70.0). Electronically Signed   By: Delanna Ahmadi M.D.   On: 10/19/2021 13:46   CT Chest W Contrast  Result Date: 10/19/2021 CLINICAL DATA:  Shortness breath EXAM: CT CHEST WITH CONTRAST TECHNIQUE: Multidetector CT imaging of the chest was performed during intravenous contrast administration. RADIATION DOSE REDUCTION: This exam was performed according to the departmental dose-optimization program which includes automated exposure control, adjustment of the mA and/or kV according to patient size and/or use of iterative reconstruction technique.  CONTRAST:  27mL OMNIPAQUE IOHEXOL 300 MG/ML  SOLN COMPARISON:  Chest x-ray dated October 19, 2021; CT of the abdomen and pelvis dated May 03, 2018. FINDINGS: Cardiovascular: Normal heart size. No pericardial effusion. Atherosclerotic disease of the thoracic aorta. No definite coronary artery calcifications. Mediastinum/Nodes: Esophagus and thyroid are unremarkable. Enlarged mediastinal lymph nodes. Reference precarinal lymph node measuring 1.4 cm in short axis on series 2, image 84. Lungs/Pleura: Central airways are patent. Centrilobular emphysema. Bilateral nodular pleural thickening and perifissural nodules. Reference right pleural nodule measuring 9 mm on series 2, image 44. Irregular juxtapleural solid nodule of the left upper lobe measuring 1.5 x 1.0 cm. Additional subpleural nodule of the inferior left upper lobe measuring 1.7 x 1.2 cm on series 4, image 63. Small right pleural effusion and loculated moderate left pleural effusion. Near complete collapse of the left lower lobe. Upper Abdomen: Fullness of the pancreatic tail with adjacent fluid and numerous perisplenic varices. Left adrenal gland nodule measuring up to 1.5 cm, unchanged in size when compared with prior CT. Left paraspinal soft tissue thickening, measuring up to 1.6 cm. Musculoskeletal: No chest wall abnormality. No acute or significant osseous findings. IMPRESSION: 1. Small right pleural effusion and loculated moderate left pleural effusion with associated pleural thickening, numerous pleural and perifissural nodules and left upper lobe subpleural parenchymal nodules. Differential considerations include metastatic disease related to patient's bladder cancer versus primary lung malignancy. 2. Enlarged mediastinal lymph nodes, likely due to nodal metastatic disease. 3. Asymmetric left paraspinous soft tissue thickening, concerning for additional site of metastatic disease. Recommend contrast-enhanced CT of the abdomen and pelvis for further  evaluation. 4. 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 Recommend attention on CT. 5. Aortic Atherosclerosis (ICD10-I70.0) and Emphysema (ICD10-J43.9). Electronically Signed   By: Yetta Glassman M.D.   On: 10/19/2021 12:10   DG Chest Port 1 View  Result Date: 10/19/2021 CLINICAL DATA:  Cough, shortness of breath, history COPD, hypertension, smoker, bladder cancer EXAM: PORTABLE CHEST 1 VIEW COMPARISON:  Portable exam 0832 hours compared to 03/09/2017 FINDINGS: Normal heart size, mediastinal contours, and pulmonary vascularity. Atherosclerotic calcification aorta. New BILATERAL pleural effusions, small RIGHT, moderate LEFT. Bibasilar atelectasis greater on LEFT. No definite infiltrate or pneumothorax. Questionable nodular foci RIGHT lung. No acute osseous findings. IMPRESSION: BILATERAL pleural effusions and bibasilar atelectasis much greater on LEFT. Question nodular foci RIGHT lung. Further evaluation by CT chest with contrast recommended. Aortic Atherosclerosis (ICD10-I70.0). Electronically Signed   By: Lavonia Dana M.D.   On: 10/19/2021 08:49    Microbiology: Recent Results (from the past 240 hour(s))  Culture, body fluid w Gram Stain-bottle     Status: None   Collection Time: 11/07/21  1:06 PM   Specimen: Pleura  Result Value Ref Range Status   Specimen Description PLEURAL  Final   Special Requests   Final    Blood Culture adequate volume BOTTLES DRAWN AEROBIC AND ANAEROBIC   Culture   Final    NO GROWTH 5 DAYS Performed at [facility], [address], [place], [place] [address]    Report Status 11/12/2021 FINAL  Final  Gram stain     Status: None   Collection Time: 11/07/21  1:06 PM   Specimen: Pleura  Result Value Ref Range Status   Specimen Description PLEURAL  Final   Special Requests NONE  Final   Gram Stain   Final    NO ORGANISMS SEEN WBC PRESENT,  PREDOMINANTLY MONONUCLEAR CYTOSPIN SMEAR Performed at [facility], [address] [place], [place] [address]    Report Status 11/07/2021 FINAL  Final  Body fluid culture w Gram Stain     Status: None   Collection Time: 11/09/21  4:00 PM   Specimen: Pleural Fluid  Result Value Ref Range Status   Specimen Description PLEURAL  Final   Special Requests RIGHT  Final   Gram Stain NO WBC SEEN NO ORGANISMS SEEN   Final   Culture   Final    NO GROWTH 3 DAYS Performed at [facility], [place] [address], [place], [place] [address]    Report Status 11/13/2021 FINAL  Final  Fungus Culture With Stain     Status: None (Preliminary result)   Collection Time: 11/09/21  4:00 PM   Specimen: Pleural Fluid  Result Value Ref Range Status   Fungus Stain Final report  Final    Comment: (NOTE) Performed At: [facility] [place], [place] [id number] Rush Farmer MD [id number]    Fungus (Mycology) Culture PENDING  Incomplete   Fungal Source RIGHT PLEURAL  Final    Comment: Performed at [facility], [place] [address], [place],  [address]  Fungus Culture Result     Status: None   Collection Time: 11/09/21  4:00 PM  Result Value Ref Range Status   Result 1 Comment  Final    Comment: (NOTE) KOH/Calcofluor preparation:  no fungus observed. Performed At: [facility] [place], [place] [id number] Rush Farmer MD [id number]     Time spent: 35 minutes  Signed: Oren Binet, MD 11/16/21

## 2021-11-29 NOTE — Plan of Care (Signed)
  Problem: Education: Goal: Knowledge of General Education information will improve Description: Including pain rating scale, medication(s)/side effects and non-pharmacologic comfort measures Outcome: Not Progressing   Problem: Health Behavior/Discharge Planning: Goal: Ability to manage health-related needs will improve Outcome: Not Progressing   Problem: Clinical Measurements: Goal: Diagnostic test results will improve Outcome: Not Progressing Goal: Respiratory complications will improve Outcome: Not Progressing   Problem: Activity: Goal: Risk for activity intolerance will decrease Outcome: Not Progressing   Problem: Nutrition: Goal: Adequate nutrition will be maintained Outcome: Not Progressing   Problem: Coping: Goal: Level of anxiety will decrease Outcome: Not Progressing

## 2021-11-29 DEATH — deceased

## 2021-12-16 LAB — FUNGUS CULTURE RESULT

## 2021-12-16 LAB — FUNGUS CULTURE WITH STAIN

## 2021-12-16 LAB — FUNGAL ORGANISM REFLEX

## 2021-12-20 ENCOUNTER — Encounter (HOSPITAL_COMMUNITY): Payer: Self-pay

## 2021-12-27 LAB — ACID FAST CULTURE WITH REFLEXED SENSITIVITIES (MYCOBACTERIA): Acid Fast Culture: NEGATIVE

## 2022-08-21 IMAGING — DX DG HIP (WITH OR WITHOUT PELVIS) 2-3V*L*
3 series · 3 of 3 positions shown · non-contrast
Comparison: None.

CLINICAL DATA: Fall 3 weeks ago.  Persistent left hip pain.

EXAM:
DG HIP (WITH OR WITHOUT PELVIS) 2-3V LEFT

[pelvis ap]
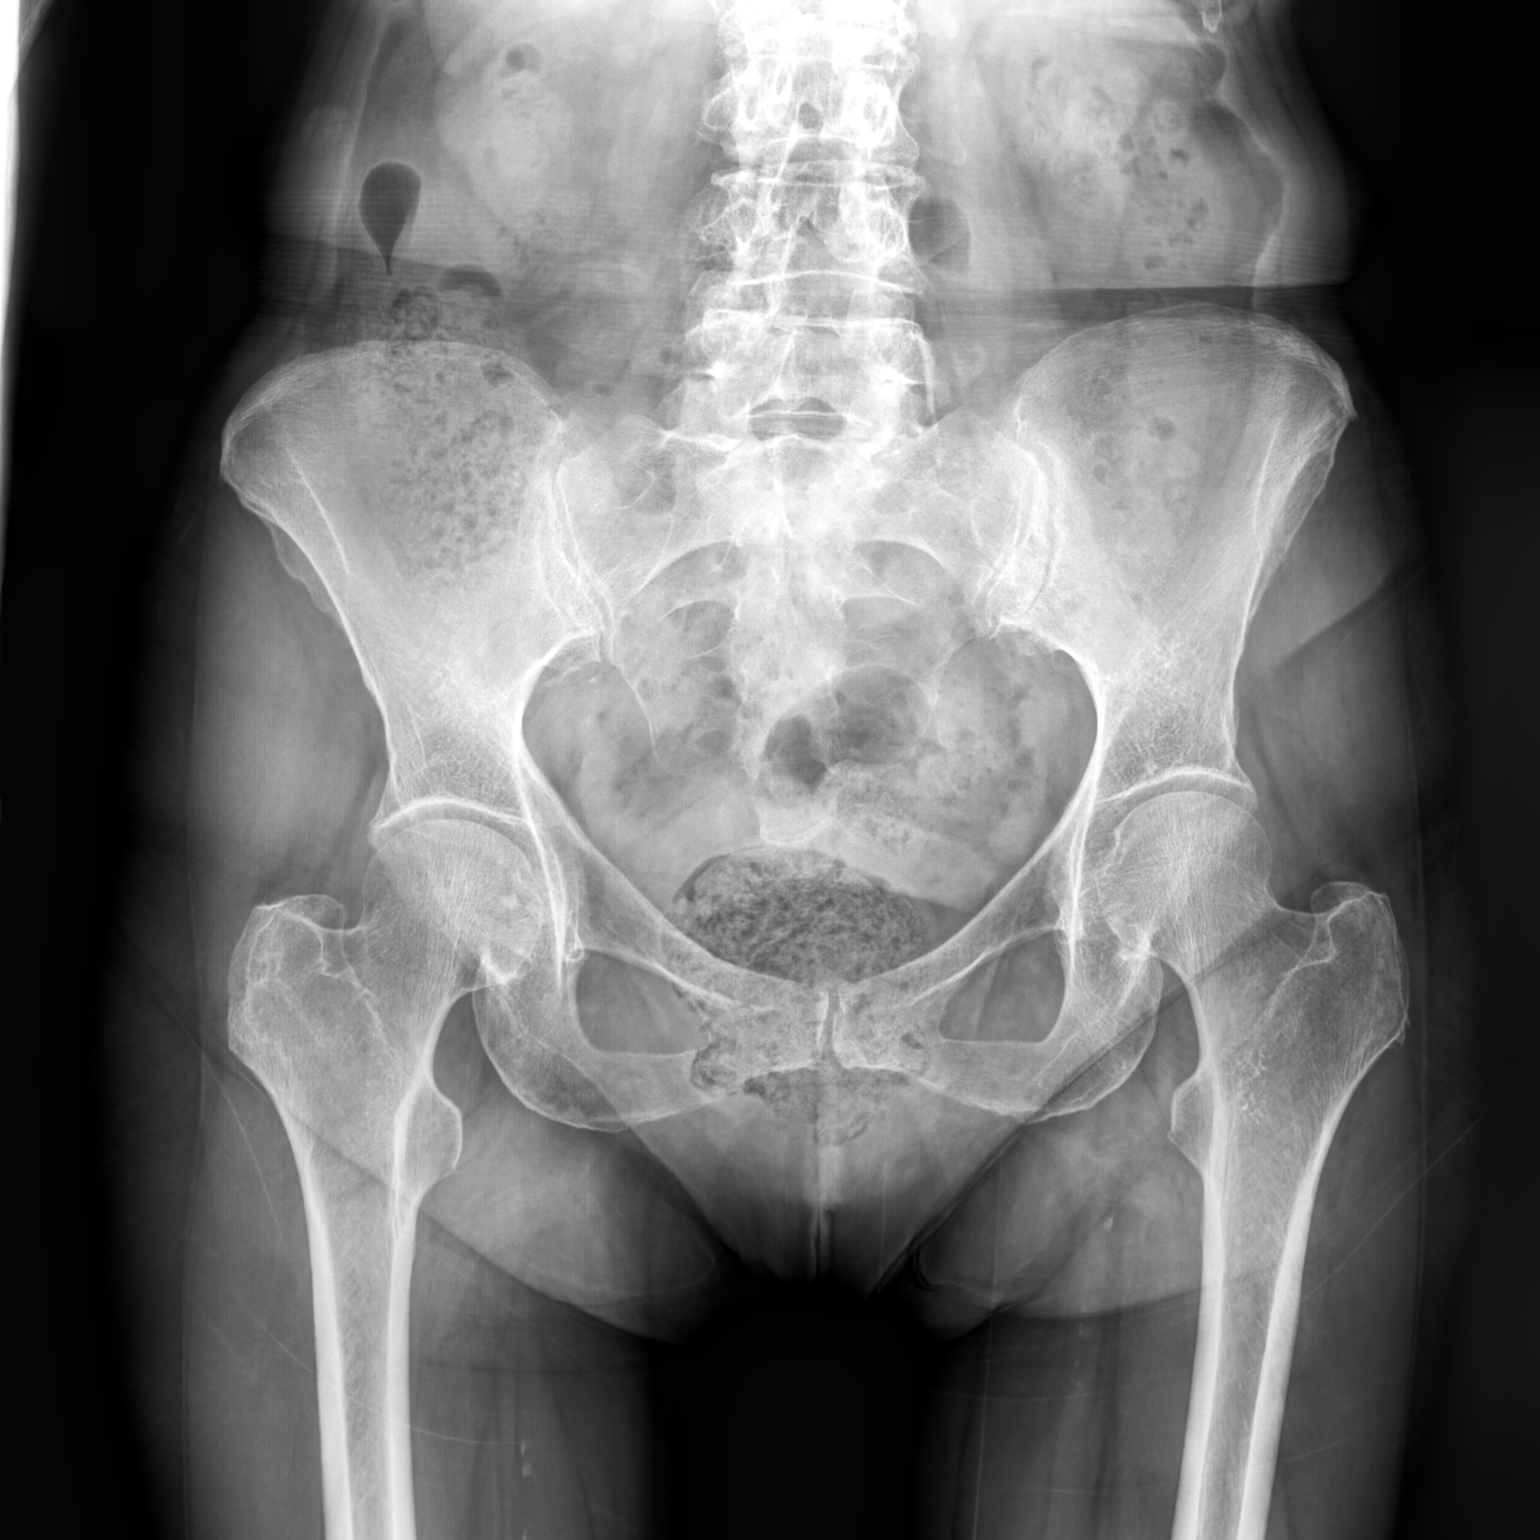

[hip ap]
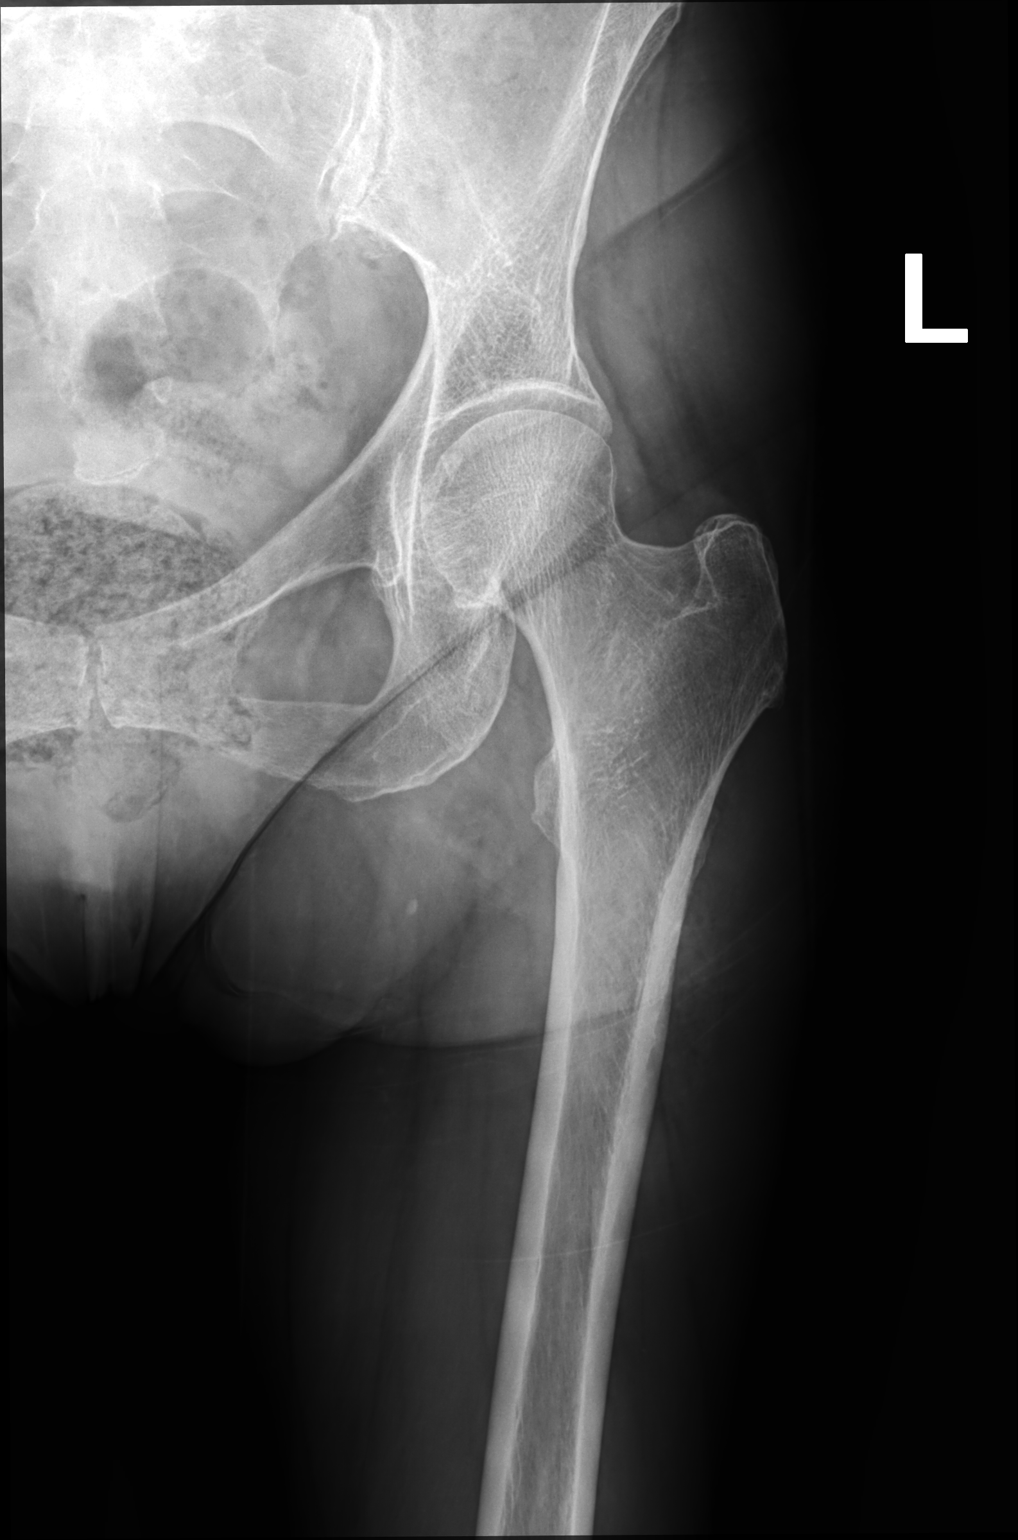

[hip (frog leg)]
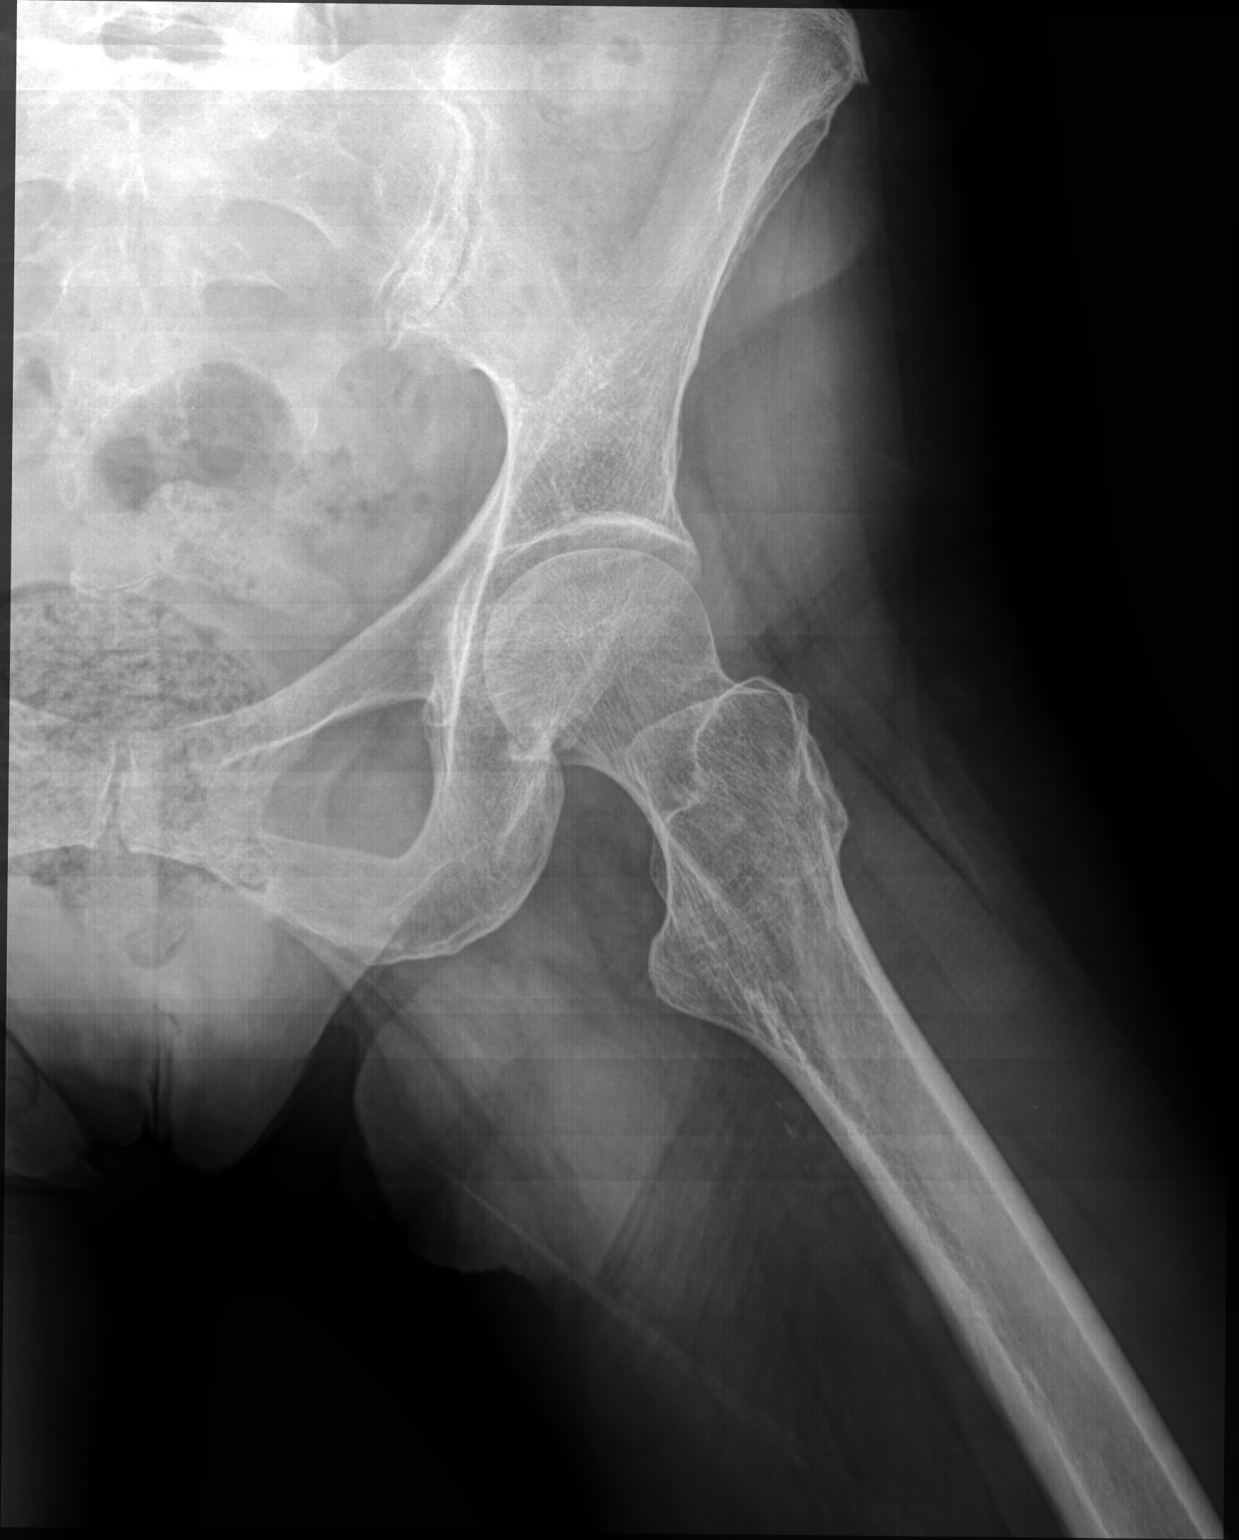

[3 of 3 positions shown; findings below may reference images not displayed]

FINDINGS: There is no evidence of hip fracture or dislocation. There is no
evidence of arthropathy or other focal bone abnormality.
IMPRESSION: Negative.
